# Patient Record
Sex: Female | Born: 1945 | Race: White | Hispanic: No | Marital: Single | Smoking: Never smoker
Health system: Southern US, Community
[De-identification: ages and names within clinical notes are randomized; demographics above are authoritative.]

## PROBLEM LIST (undated history)

## (undated) DIAGNOSIS — Z860100 Personal history of colon polyps, unspecified: Secondary | ICD-10-CM

## (undated) DIAGNOSIS — K219 Gastro-esophageal reflux disease without esophagitis: Secondary | ICD-10-CM

## (undated) DIAGNOSIS — I10 Essential (primary) hypertension: Secondary | ICD-10-CM

## (undated) DIAGNOSIS — T7840XA Allergy, unspecified, initial encounter: Secondary | ICD-10-CM

## (undated) DIAGNOSIS — E079 Disorder of thyroid, unspecified: Secondary | ICD-10-CM

## (undated) HISTORY — DX: Allergy, unspecified, initial encounter: T78.40XA

## (undated) HISTORY — DX: Personal history of colonic polyps: Z86.010

## (undated) HISTORY — DX: Essential (primary) hypertension: I10

## (undated) HISTORY — PX: TONSILLECTOMY: SUR1361

## (undated) HISTORY — DX: Disorder of thyroid, unspecified: E07.9

## (undated) HISTORY — PX: WRIST SURGERY: SHX841

## (undated) HISTORY — PX: MOUTH SURGERY: SHX715

## (undated) HISTORY — DX: Gastro-esophageal reflux disease without esophagitis: K21.9

## (undated) HISTORY — DX: Personal history of colon polyps, unspecified: Z86.0100

---

## 1982-07-20 LAB — HM PAP SMEAR

## 1982-07-20 LAB — HM MAMMOGRAPHY

## 2011-07-21 LAB — HM COLONOSCOPY

## 2011-11-19 ENCOUNTER — Encounter: Payer: Self-pay | Admitting: Gastroenterology

## 2011-11-23 ENCOUNTER — Ambulatory Visit (INDEPENDENT_AMBULATORY_CARE_PROVIDER_SITE_OTHER): Payer: BC Managed Care – PPO | Admitting: Gastroenterology

## 2011-11-23 ENCOUNTER — Encounter: Payer: Self-pay | Admitting: Gastroenterology

## 2011-11-23 VITALS — BP 140/82 | HR 92 | Ht 62.0 in | Wt 141.0 lb

## 2011-11-23 DIAGNOSIS — K625 Hemorrhage of anus and rectum: Secondary | ICD-10-CM

## 2011-11-23 DIAGNOSIS — Z800 Family history of malignant neoplasm of digestive organs: Secondary | ICD-10-CM | POA: Insufficient documentation

## 2011-11-23 MED ORDER — PEG-KCL-NACL-NASULF-NA ASC-C 100 G PO SOLR: 1.0000 | Freq: Once | ORAL | Status: DC

## 2011-11-23 MED ORDER — HYDROCORTISONE ACETATE 25 MG RE SUPP: 25.0000 mg | Freq: Two times a day (BID) | RECTAL | Status: AC

## 2011-11-23 NOTE — Assessment & Plan Note (Addendum)
Limited rectal bleeding could be due to hemorrhoids. A more proximal colonic bleeding source should be ruled out.  Recommendations #1 colonoscopy #2 trial of Anusol HC suppositories

## 2011-11-23 NOTE — Patient Instructions (Signed)
Colonoscopy A colonoscopy is an exam to evaluate your entire colon. In this exam, your colon is cleansed. A long fiberoptic tube is inserted through your rectum and into your colon. The fiberoptic scope (endoscope) is a long bundle of enclosed and very flexible fibers. These fibers transmit light to the area examined and send images from that area to your caregiver. Discomfort is usually minimal. You may be given a drug to help you sleep (sedative) during or prior to the procedure. This exam helps to detect lumps (tumors), polyps, inflammation, and areas of bleeding. Your caregiver may also take a small piece of tissue (biopsy) that will be examined under a microscope. LET YOUR CAREGIVER KNOW ABOUT:   Allergies to food or medicine.   Medicines taken, including vitamins, herbs, eyedrops, over-the-counter medicines, and creams.   Use of steroids (by mouth or creams).   Previous problems with anesthetics or numbing medicines.   History of bleeding problems or blood clots.   Previous surgery.   Other health problems, including diabetes and kidney problems.   Possibility of pregnancy, if this applies.  BEFORE THE PROCEDURE   A clear liquid diet may be required for 2 days before the exam.   Ask your caregiver about changing or stopping your regular medications.   Liquid injections (enemas) or laxatives may be required.   A large amount of electrolyte solution may be given to you to drink over a short period of time. This solution is used to clean out your colon.   You should be present 60 minutes prior to your procedure or as directed by your caregiver.  AFTER THE PROCEDURE   If you received a sedative or pain relieving medication, you will need to arrange for someone to drive you home.   Occasionally, there is a little blood passed with the first bowel movement. Do not be concerned.  FINDING OUT THE RESULTS OF YOUR TEST Not all test results are available during your visit. If your test  results are not back during the visit, make an appointment with your caregiver to find out the results. Do not assume everything is normal if you have not heard from your caregiver or the medical facility. It is important for you to follow up on all of your test results. HOME CARE INSTRUCTIONS   It is not unusual to pass moderate amounts of gas and experience mild abdominal cramping following the procedure. This is due to air being used to inflate your colon during the exam. Walking or a warm pack on your belly (abdomen) may help.   You may resume all normal meals and activities after sedatives and medicines have worn off.   Only take over-the-counter or prescription medicines for pain, discomfort, or fever as directed by your caregiver. Do not use aspirin or blood thinners if a biopsy was taken. Consult your caregiver for medicine usage if biopsies were taken.  SEEK IMMEDIATE MEDICAL CARE IF:   You have a fever.   You pass large blood clots or fill a toilet with blood following the procedure. This may also occur 10 to 14 days following the procedure. This is more likely if a biopsy was taken.   You develop abdominal pain that keeps getting worse and cannot be relieved with medicine.  Document Released: 07/03/2000 Document Revised: 06/25/2011 Document Reviewed: 02/16/2008 Exit[employer] Patient Information 2012 [employer]. 

## 2011-11-23 NOTE — Assessment & Plan Note (Signed)
2 half siblings with colon cancer. Plan colonoscopy

## 2011-11-23 NOTE — Progress Notes (Signed)
History of Present Illness: Ann Perkins is a pleasant 66 year old white female self-referred for evaluation of rectal bleeding. In the past she's had very intermittent bleeding consisting of small amounts of blood coating the stools. This may occur more frequently when she eats roughage. In the last couple of months bleeding has increased. She has occasional rectal discomfort. There has been no change of bowel habits. She has a tendency to have loose stools. Family history is pertinent for 2 half siblings with colon cancer.    Past Medical History  Diagnosis Date  . Hemorrhoids    Past Surgical History  Procedure Date  . Tonsillectomy    family history includes Colon cancer in her brother, cousin, and sister. No current outpatient prescriptions on file.   Allergies as of 11/23/2011 - Review Complete 11/23/2011  Allergen Reaction Noted  . Erythromycin  11/23/2011    reports that she has never smoked. She has never used smokeless tobacco. She reports that she does not drink alcohol or use illicit drugs.     Review of Systems: Pertinent positive and negative review of systems were noted in the above HPI section. All other review of systems were otherwise negative.  Vital signs were reviewed in today's medical record Physical Exam: General: Well developed , well nourished, no acute distress Head: Normocephalic and atraumatic Eyes:  sclerae anicteric, EOMI Ears: Normal auditory acuity Mouth: No deformity or lesions Neck: Supple, no masses or thyromegaly Lungs: Clear throughout to auscultation Heart: Regular rate and rhythm; no murmurs, rubs or bruits Abdomen: Soft, non tender and non distended. No masses, hepatosplenomegaly or hernias noted. Normal Bowel sounds Rectal: There are no external rectal lesions Musculoskeletal: Symmetrical with no gross deformities  Skin: No lesions on visible extremities Pulses:  Normal pulses noted Extremities: No clubbing, cyanosis, edema or  deformities noted Neurological: Alert oriented x 4, grossly nonfocal Cervical Nodes:  No significant cervical adenopathy Inguinal Nodes: No significant inguinal adenopathy Psychological:  Alert and cooperative. Normal mood and affect

## 2011-11-24 ENCOUNTER — Telehealth: Payer: Self-pay | Admitting: Gastroenterology

## 2011-11-24 NOTE — Telephone Encounter (Signed)
Pt states that when she had some dental work done years ago she had some problems with her heart skipping some. Pt wanted to make sure she told Dr. Arlyce Dice since she is scheduled for the colon with propofol. Dr. Arlyce Dice notified.

## 2011-11-24 NOTE — Telephone Encounter (Signed)
ok 

## 2011-12-01 ENCOUNTER — Ambulatory Visit (AMBULATORY_SURGERY_CENTER): Payer: BC Managed Care – PPO | Admitting: Gastroenterology

## 2011-12-01 ENCOUNTER — Encounter: Payer: Self-pay | Admitting: Gastroenterology

## 2011-12-01 VITALS — BP 150/96 | HR 102 | Temp 98.3°F | Resp 20 | Ht 62.0 in | Wt 141.0 lb

## 2011-12-01 DIAGNOSIS — D126 Benign neoplasm of colon, unspecified: Secondary | ICD-10-CM

## 2011-12-01 DIAGNOSIS — Z800 Family history of malignant neoplasm of digestive organs: Secondary | ICD-10-CM

## 2011-12-01 DIAGNOSIS — K5730 Diverticulosis of large intestine without perforation or abscess without bleeding: Secondary | ICD-10-CM

## 2011-12-01 DIAGNOSIS — K625 Hemorrhage of anus and rectum: Secondary | ICD-10-CM

## 2011-12-01 MED ORDER — SODIUM CHLORIDE 0.9 % IV SOLN: 500.0000 mL | INTRAVENOUS | Status: DC

## 2011-12-01 NOTE — Progress Notes (Signed)
The pt tolerated the colonoscopy very well. Maw   

## 2011-12-01 NOTE — Progress Notes (Signed)
Patient did not have preoperative order for IV antibiotic SSI prophylaxis. (G8918)  Patient did not experience any of the following events: a burn prior to discharge; a fall within the facility; wrong site/side/patient/procedure/implant event; or a hospital transfer or hospital admission upon discharge from the facility. (G8907)  

## 2011-12-01 NOTE — Patient Instructions (Signed)
Impressions/Recommendations:  Polyps removed (handout given) Diverticulosis (handout given)  Call the office to schedule an appointment for 4-6 weeks.  YOU HAD AN ENDOSCOPIC PROCEDURE TODAY AT THE [facility]: Refer to the procedure report that was given to you for any specific questions about what was found during the examination.  If the procedure report does not answer your questions, please call your gastroenterologist to clarify.  If you requested that your care partner not be given the details of your procedure findings, then the procedure report has been included in a sealed envelope for you to review at your convenience later.  YOU SHOULD EXPECT: Some feelings of bloating in the abdomen. Passage of more gas than usual.  Walking can help get rid of the air that was put into your GI tract during the procedure and reduce the bloating. If you had a lower endoscopy (such as a colonoscopy or flexible sigmoidoscopy) you may notice spotting of blood in your stool or on the toilet paper. If you underwent a bowel prep for your procedure, then you may not have a normal bowel movement for a few days.  DIET: Your first meal following the procedure should be a light meal and then it is ok to progress to your normal diet.  A half-sandwich or bowl of soup is an example of a good first meal.  Heavy or fried foods are harder to digest and may make you feel nauseous or bloated.  Likewise meals heavy in dairy and vegetables can cause extra gas to form and this can also increase the bloating.  Drink plenty of fluids but you should avoid alcoholic beverages for 24 hours.  ACTIVITY: Your care partner should take you home directly after the procedure.  You should plan to take it easy, moving slowly for the rest of the day.  You can resume normal activity the day after the procedure however you should NOT DRIVE or use heavy machinery for 24 hours (because of the sedation medicines used during the test).     SYMPTOMS TO REPORT IMMEDIATELY: A gastroenterologist can be reached at any hour.  During normal business hours, 8:30 AM to 5:00 PM Monday through Friday, call [phone].  After hours and on weekends, please call the GI answering service at [phone] who will take a message and have the physician on call contact you.   Following lower endoscopy (colonoscopy or flexible sigmoidoscopy):  Excessive amounts of blood in the stool  Significant tenderness or worsening of abdominal pains  Swelling of the abdomen that is new, acute  Fever of 100F or higher  Following upper endoscopy (EGD)  Vomiting of blood or coffee ground material  New chest pain or pain under the shoulder blades  Painful or persistently difficult swallowing  New shortness of breath  Fever of 100F or higher  Black, tarry-looking stools  FOLLOW UP: If any biopsies were taken you will be contacted by phone or by letter within the next 1-3 weeks.  Call your gastroenterologist if you have not heard about the biopsies in 3 weeks.  Our staff will call the home number listed on your records the next business day following your procedure to check on you and address any questions or concerns that you may have at that time regarding the information given to you following your procedure. This is a courtesy call and so if there is no answer at the home number and we have not heard from you through the emergency physician on  physician on call, we will assume that you have returned to your regular daily activities without incident.  SIGNATURES/CONFIDENTIALITY: You and/or your care partner have signed paperwork which will be entered into your electronic medical record.  These signatures attest to the fact that that the information above on your After Visit Summary has been reviewed and is understood.  Full responsibility of the confidentiality of this discharge information lies with you and/or your care-partner. 

## 2011-12-01 NOTE — Op Note (Signed)
[facility] [address] [place], [place]  [address]  COLONOSCOPY PROCEDURE REPORT  PATIENT:  Ann Perkins, Ann Perkins  MR#:  [id number] BIRTHDATE:  09/01/1945, 66 yrs. old  GENDER:  female ENDOSCOPIST:  Barbette Hair. Arlyce Dice, MD REF. BY: PROCEDURE DATE:  12/01/2011 PROCEDURE:  Colonoscopy with snare polypectomy ASA CLASS:  Class II INDICATIONS:  Elevated Risk Screening, family history of colon cancer Half sister and brother with colon ca MEDICATIONS:   MAC sedation, administered by CRNA propofol 300mg IV  DESCRIPTION OF PROCEDURE:   After the risks benefits and alternatives of the procedure were thoroughly explained, informed consent was obtained.  Digital rectal exam was performed and revealed no abnormalities.   The LB [id number] endoscope was introduced through the anus and advanced to the cecum, which was identified by both the appendix and ileocecal valve, without limitations.  The quality of the prep was excellent, using MoviPrep.  The instrument was then slowly withdrawn as the colon was fully examined. <<PROCEDUREIMAGES>>  FINDINGS:  A sessile polyp was found in the cecum. It was 8 mm in size. Polyp was snared without cautery. Retrieval was successful (see image6). snare polyp  A pedunculated polyp was found in the sigmoid colon. It was 30 cm in size. Polyps were snared, then cauterized with monopolar cautery. Retrieval was successful (see image7 and image9). snare polyp  A sessile polyp was found in the sigmoid colon. It was 10 mm in size. It was found 20 cm from the point of entry. Polyp was snared without cautery. Retrieval was unsuccessful (see image10). snare polyp  A sessile polyp was found in the recto-sigmoid colon. It was 3 mm in size. It was found 10 cm from the point of entry. Polyp was snared without cautery. Retrieval was successful (see image11). snare polyp  other finding in the rectum (see image12). Enlarged anal papillae  Scattered diverticula were  found in the sigmoid colon (see image13).  This was otherwise a normal examination of the colon (see image2 and image3).   Retroflexed views in the rectum revealed no abnormalities.    The time to cecum =  1) 3.25  minutes. The scope was then withdrawn in  1) 22.50  minutes from the cecum and the procedure completed. COMPLICATIONS:  None ENDOSCOPIC IMPRESSION: 1) 8 mm sessile polyp in the cecum 2) 30 cm pedunculated polyp in the sigmoid colon 3) 10 mm sessile polyp in the sigmoid colon 4) 3 mm sessile polyp in the recto-sigmoid colon 5) Other finding in the rectum 6) Diverticula, scattered in the sigmoid colon 7) Otherwise normal examination RECOMMENDATIONS: 1) If the polyp(s) removed today are proven to be adenomatous (pre-cancerous) polyps, you will need a colonoscopy in 3 years. Otherwise you should continue to follow colorectal cancer screening guidelines for "routine risk" patients with a colonoscopy in 10 years. You will receive a letter within 1-2 weeks with the results of your biopsy as well as final recommendations. Please call my office if you have not received a letter after 3 weeks. REPEAT EXAM:   You will receive a letter from Dr. Arlyce Dice in 1-2 weeks, after reviewing the final pathology, with followup recommendations.  ______________________________ Barbette Hair Arlyce Dice, MD  CC:  n. eSIGNED:   Barbette Hair. Danylle Ouk at 12/01/2011 03:14 PM  [place], [place], [id number]

## 2011-12-02 ENCOUNTER — Telehealth: Payer: Self-pay

## 2011-12-02 NOTE — Telephone Encounter (Signed)
  Follow up Call-  Call back number 12/01/2011  Post procedure Call Back phone  # [phone]  Permission to leave phone message Yes     Patient questions:  Do you have a fever, pain , or abdominal swelling? no Pain Score  0 *  Have you tolerated food without any problems? yes  Have you been able to return to your normal activities? yes  Do you have any questions about your discharge instructions: Diet   no Medications  no Follow up visit  no  Do you have questions or concerns about your Care? no  Actions: * If pain score is 4 or above: No action needed, pain <4.

## 2011-12-10 ENCOUNTER — Encounter: Payer: Self-pay | Admitting: Gastroenterology

## 2012-01-27 ENCOUNTER — Ambulatory Visit (INDEPENDENT_AMBULATORY_CARE_PROVIDER_SITE_OTHER): Payer: BC Managed Care – PPO | Admitting: Gastroenterology

## 2012-01-27 ENCOUNTER — Encounter: Payer: Self-pay | Admitting: Gastroenterology

## 2012-01-27 VITALS — BP 166/94 | HR 100 | Ht 62.0 in | Wt 139.0 lb

## 2012-01-27 DIAGNOSIS — K625 Hemorrhage of anus and rectum: Secondary | ICD-10-CM

## 2012-01-27 DIAGNOSIS — Z800 Family history of malignant neoplasm of digestive organs: Secondary | ICD-10-CM

## 2012-01-27 DIAGNOSIS — Z86010 Personal history of colonic polyps: Secondary | ICD-10-CM | POA: Insufficient documentation

## 2012-01-27 NOTE — Progress Notes (Signed)
History of Present Illness:  Ann Perkins has returned following colonoscopy. Multiple colon polyps were removed. 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 Her main complaint is frequent low-volume stools. This may occur after eating vegetables. Stools tend to be solid.    Review of Systems: Pertinent positive and negative review of systems were noted in the above HPI section. All other review of systems were otherwise negative.    Current Medications, Allergies, Past Medical History, Past Surgical History, Family History and Social History were reviewed in [employer] electronic medical record  Vital signs were reviewed in today's medical record. Physical Exam: General: Well developed , well nourished, no acute distress

## 2012-01-27 NOTE — Assessment & Plan Note (Signed)
Plan colonoscopy 2016 

## 2012-01-27 NOTE — Assessment & Plan Note (Signed)
Probably related to hemorrhoids or polyposis. No further therapy or workup at this point.

## 2012-01-27 NOTE — Patient Instructions (Addendum)
Follow up as needed

## 2012-01-27 NOTE — Assessment & Plan Note (Signed)
Plan screening colonoscopy every 5 years 

## 2012-10-19 ENCOUNTER — Encounter: Payer: Self-pay | Admitting: Internal Medicine

## 2012-10-19 ENCOUNTER — Ambulatory Visit (INDEPENDENT_AMBULATORY_CARE_PROVIDER_SITE_OTHER): Payer: Medicare Other | Admitting: Internal Medicine

## 2012-10-19 ENCOUNTER — Telehealth: Payer: Self-pay | Admitting: Internal Medicine

## 2012-10-19 VITALS — BP 180/90 | HR 105 | Temp 97.8°F | Ht 59.0 in | Wt 140.2 lb

## 2012-10-19 DIAGNOSIS — Z13220 Encounter for screening for lipoid disorders: Secondary | ICD-10-CM

## 2012-10-19 DIAGNOSIS — I10 Essential (primary) hypertension: Secondary | ICD-10-CM

## 2012-10-19 DIAGNOSIS — Z800 Family history of malignant neoplasm of digestive organs: Secondary | ICD-10-CM

## 2012-10-19 DIAGNOSIS — K219 Gastro-esophageal reflux disease without esophagitis: Secondary | ICD-10-CM

## 2012-10-19 DIAGNOSIS — Z86010 Personal history of colonic polyps: Secondary | ICD-10-CM

## 2012-10-19 DIAGNOSIS — K625 Hemorrhage of anus and rectum: Secondary | ICD-10-CM

## 2012-10-19 LAB — COMPREHENSIVE METABOLIC PANEL
ALT: 20 U/L (ref 0–35)
Albumin: 4.2 g/dL (ref 3.5–5.2)
CO2: 27 mEq/L (ref 19–32)
Chloride: 103 mEq/L (ref 96–112)
GFR: 64.27 mL/min (ref >60.00)
Glucose, Bld: 108 mg/dL — ABNORMAL HIGH (ref 70–99)
Potassium: 4 mEq/L (ref 3.5–5.1)
Sodium: 139 mEq/L (ref 135–145)
Total Bilirubin: 1.2 mg/dL (ref 0.3–1.2)
Total Protein: 7.9 g/dL (ref 6.0–8.3)

## 2012-10-19 LAB — CBC WITH DIFFERENTIAL/PLATELET
Basophils Absolute: 0 10*3/uL (ref 0.0–0.1)
Eosinophils Relative: 0.6 % (ref 0.0–5.0)
HCT: 42.4 % (ref 36.0–46.0)
Lymphocytes Relative: 21.6 % (ref 12.0–46.0)
Monocytes Relative: 8.5 % (ref 3.0–12.0)
Neutrophils Relative %: 68.9 % (ref 43.0–77.0)
Platelets: 197 10*3/uL (ref 150.0–400.0)
RDW: 13.3 % (ref 11.5–14.6)
WBC: 6.4 10*3/uL (ref 4.5–10.5)

## 2012-10-19 LAB — TSH: TSH: 10.25 u[IU]/mL — ABNORMAL HIGH (ref 0.35–5.50)

## 2012-10-19 LAB — LIPID PANEL
HDL: 37.7 mg/dL — ABNORMAL LOW (ref >39.00)
Total CHOL/HDL Ratio: 5
VLDL: 22.8 mg/dL (ref 0.0–40.0)

## 2012-10-19 MED ORDER — LISINOPRIL 10 MG PO TABS: 10.0000 mg | ORAL_TABLET | Freq: Every day | ORAL | Status: DC

## 2012-10-19 NOTE — Assessment & Plan Note (Signed)
On no medication.  Blood pressure elevated and has been elevated on multiple checks.  Will start Lisinopril 10mg  q day.  Have her spot check her pressure and get her back in soon to reassess.  Check metabolic panel.

## 2012-10-19 NOTE — Assessment & Plan Note (Signed)
Controlled if she watches what she eats.  Follow.     

## 2012-10-19 NOTE — Assessment & Plan Note (Signed)
Resolved.  No bleeding now.  Follow.

## 2012-10-19 NOTE — Telephone Encounter (Signed)
Left detailed message on patient's voice mail.

## 2012-10-19 NOTE — Assessment & Plan Note (Signed)
Up to date with colonoscopy as outlined.  Follow.   

## 2012-10-19 NOTE — Telephone Encounter (Signed)
Would go ahead and start today and then can continue q am.

## 2012-10-19 NOTE — Telephone Encounter (Signed)
Pt would like to know if she needs to take her bp meds in am or pm Please advise

## 2012-10-19 NOTE — Progress Notes (Signed)
Subjective:    Patient ID: Ann Perkins, female    DOB: Nov 10, 1945, 67 y.o.   MRN: [id number]  HPI 67 year old female with past history of colon polyps, GERD and elevated blood pressure.  She comes in today to follow up on these issues as well as to establish care. She states she has not had a primary care doctor in years.  Did see Dr Arlyce Dice last year.  Had a colonoscopy.  Polyps found.  Adenomatous polyps.  Recommended follow up colonoscopy in three years.  She states she still will have some rectal irritation at times with straining.  No more blood.  Bowels stable.  Eating fiber bars.  She did have mouth surgery.  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  Second surgery - clear margins.  Has been released to follow up with her dentist.  Occasionally has acid reflux.  Is she avoids foods that aggravate - no problems.  No dysphagia.  No nausea or vomiting.     Past Medical History  Diagnosis Date  . Hemorrhoids   . Allergy   . GERD (gastroesophageal reflux disease)   . History of colon polyps   . Hypertension     Outpatient Encounter Prescriptions as of 10/19/2012  Medication Sig Dispense Refill  . hydrocortisone (ANUSOL-HC) 25 MG suppository Place 25 mg rectally 2 (two) times daily.      Marland Kitchen lisinopril (PRINIVIL,ZESTRIL) 10 MG tablet Take 1 tablet (10 mg total) by mouth daily.  30 tablet  1  . sodium chloride (OCEAN) 0.65 % nasal spray Place 1 spray into the nose as needed for congestion.      . [DISCONTINUED] lisinopril (PRINIVIL,ZESTRIL) 10 MG tablet Take 10 mg by mouth daily.       No facility-administered encounter medications on file as of 10/19/2012.    Review of Systems Patient denies any headache, lightheadedness or dizziness.  No sinus or allergy symptoms.  No chest pain, tightness or palpitations.  No increased shortness of breath, cough or congestion.  No nausea or vomiting.  No significant acid reflux.  If she avoids foods that aggravate, no problems.  No abdominal pain or cramping.  No BRBPR  or melana.  No significant constipation.  Will notice some irritation if she strains.  No urine change.         Objective:   Physical Exam Filed Vitals:   10/19/12 1033  BP: 180/90  Pulse: 105  Temp: 97.8 F (36.6 C)   Blood pressure recheck:  164/96, pulse 37  67 year old female in no acute distress.   HEENT:  Nares- clear.  Oropharynx - without lesions. NECK:  Supple.  Nontender.  No audible bruit.  HEART:  Appears to be regular. LUNGS:  No crackles or wheezing audible.  Respirations even and unlabored.  RADIAL PULSE:  Equal bilaterally.  ABDOMEN:  Soft, nontender.  Bowel sounds present and normal.  No audible abdominal bruit.   RECTAL:  Heme negative.  No external hemorrhoids.  Minimal irritation with rectal exam.   EXTREMITIES:  No increased edema present.     SKIN:  No rash.  VASCULAR:  DP pulses palpable and equal bilaterally.      Assessment & Plan:  RECTAL IRRITATION.  Exam as outlined.  Annusol HC suppositories as directed.  Follow.  Notify me if persistent problems.   ENT.  S/P mouth surgery for mild dysplasia.  Second surgery - margins clear.  Released to follow up with her dentist.    HEALTH MAINTENANCE.  Overdue a physical.  Will schedule for next visit.  Had colonoscopy as outlined.  Declines mammogram.  Declines pneumovax.

## 2012-10-19 NOTE — Assessment & Plan Note (Signed)
Colonoscopy 12/01/11 revealed multiple polyps.  Recommended follow up colonoscopy 3 years.     

## 2012-10-23 ENCOUNTER — Telehealth: Payer: Self-pay | Admitting: Internal Medicine

## 2012-10-23 MED ORDER — LEVOTHYROXINE SODIUM 50 MCG PO TABS: 50.0000 ug | ORAL_TABLET | Freq: Every day | ORAL | Status: DC

## 2012-10-23 NOTE — Telephone Encounter (Signed)
Pt notified of lab results and need to start synthroid q day.  rx sent in to pharmacy.

## 2012-11-11 ENCOUNTER — Encounter: Payer: Self-pay | Admitting: Internal Medicine

## 2012-11-11 ENCOUNTER — Ambulatory Visit (INDEPENDENT_AMBULATORY_CARE_PROVIDER_SITE_OTHER): Payer: Medicare Other | Admitting: Internal Medicine

## 2012-11-11 VITALS — BP 140/94 | HR 89 | Temp 98.1°F | Ht 59.0 in | Wt 141.5 lb

## 2012-11-11 DIAGNOSIS — K219 Gastro-esophageal reflux disease without esophagitis: Secondary | ICD-10-CM

## 2012-11-11 DIAGNOSIS — E039 Hypothyroidism, unspecified: Secondary | ICD-10-CM

## 2012-11-11 DIAGNOSIS — I10 Essential (primary) hypertension: Secondary | ICD-10-CM

## 2012-11-11 LAB — BASIC METABOLIC PANEL
BUN: 14 mg/dL (ref 6–23)
Chloride: 106 mEq/L (ref 96–112)
GFR: 65.07 mL/min (ref >60.00)
Potassium: 4.9 mEq/L (ref 3.5–5.1)
Sodium: 137 mEq/L (ref 135–145)

## 2012-11-11 LAB — TSH: TSH: 2.87 u[IU]/mL (ref 0.35–5.50)

## 2012-11-13 ENCOUNTER — Encounter: Payer: Self-pay | Admitting: Internal Medicine

## 2012-11-13 DIAGNOSIS — E039 Hypothyroidism, unspecified: Secondary | ICD-10-CM | POA: Insufficient documentation

## 2012-11-13 NOTE — Progress Notes (Signed)
  Subjective:    Patient ID: Ann Perkins, female    DOB: 03-29-1946, 67 y.o.   MRN: [id number]  HPI 68 year old female with past history of colon polyps, GERD and elevated blood pressure.  She comes in today for a scheduled follow up.  Here to follow up on her blood pressure.  Was started on Lisinopril 10mg  q day last visit.  Tolerating.  Blood pressures averaging 134-140/82-90.  Overall feels good.  Was started on synthroid after labs returned with an elevated tsh.  Tolerating.  She had a few patches of a red rash.  No diffuse rash.  This has resolved now.  She was questioning if this was related to the new medications.  No tongue swelling or lip swelling.  No breathing difficulty.   Overalls she feels she is doing well.    Past Medical History  Diagnosis Date  . Hemorrhoids   . Allergy   . GERD (gastroesophageal reflux disease)   . History of colon polyps   . Hypertension     Outpatient Encounter Prescriptions as of 11/11/2012  Medication Sig Dispense Refill  . levothyroxine (SYNTHROID) 50 MCG tablet Take 1 tablet (50 mcg total) by mouth daily before breakfast. Name brand only  30 tablet  2  . lisinopril (PRINIVIL,ZESTRIL) 10 MG tablet Take 1 tablet (10 mg total) by mouth daily.  30 tablet  1  . sodium chloride (OCEAN) 0.65 % nasal spray Place 1 spray into the nose as needed for congestion.      . hydrocortisone (ANUSOL-HC) 25 MG suppository Place 25 mg rectally 2 (two) times daily.       No facility-administered encounter medications on file as of 11/11/2012.    Review of Systems Patient denies any headache, lightheadedness or dizziness.  No sinus or allergy symptoms.  No chest pain, tightness or palpitations.  No increased shortness of breath, cough or congestion.  No nausea or vomiting.  No significant acid reflux.  No abdominal pain or cramping.  No BRBPR or melana.  No significant constipation.  No urine change.  Tolerating the lisinopril.  Blood pressures as outlined.        Objective:   Physical Exam  Filed Vitals:   11/11/12 1037  BP: 140/94  Pulse: 89  Temp: 98.1 F (36.7 C)   Blood pressure recheck:  148/90, pulse 79  67 year old female in no acute distress.   HEENT:  Nares- clear.  Oropharynx - without lesions. NECK:  Supple.  Nontender.  No audible bruit.  HEART:  Appears to be regular. LUNGS:  No crackles or wheezing audible.  Respirations even and unlabored.  RADIAL PULSE:  Equal bilaterally.  ABDOMEN:  Soft, nontender.  Bowel sounds present and normal.  No audible abdominal bruit.   EXTREMITIES:  No increased edema present.     SKIN:  No rash.  VASCULAR:  DP pulses palpable and equal bilaterally.      Assessment & Plan:  RASH.  Not present now.  I do not feel this is related to her medications.  Follow.    ENT.  S/P mouth surgery for mild dysplasia.  Second surgery - margins clear.  Released to follow up with her dentist.    HEALTH MAINTENANCE.  Overdue a physical.  Will need to schedule.  Had colonoscopy as outlined.  Declines mammogram.  Declines pneumovax.

## 2012-11-13 NOTE — Assessment & Plan Note (Signed)
Was started on Lisinopril 10mg  q day last visit.  Tolerating.  Blood pressure as outlined.  Improved.  Discussed increasing the dose to 20mg  q day.  She wanted to give it a little longer on the 10mg  dose.  Follow pressures.   Get her back in soon to reassess.  Check metabolic panel.

## 2012-11-13 NOTE — Assessment & Plan Note (Signed)
Started on synthroid.  Check tsh.

## 2012-11-13 NOTE — Assessment & Plan Note (Signed)
Controlled if she watches what she eats.  Follow.     

## 2012-11-14 ENCOUNTER — Encounter: Payer: Self-pay | Admitting: *Deleted

## 2012-12-15 ENCOUNTER — Encounter: Payer: Self-pay | Admitting: Internal Medicine

## 2012-12-15 ENCOUNTER — Ambulatory Visit (INDEPENDENT_AMBULATORY_CARE_PROVIDER_SITE_OTHER): Payer: Medicare Other | Admitting: Internal Medicine

## 2012-12-15 VITALS — BP 118/70 | HR 89 | Temp 98.4°F | Ht 59.0 in | Wt 139.2 lb

## 2012-12-15 DIAGNOSIS — I10 Essential (primary) hypertension: Secondary | ICD-10-CM

## 2012-12-15 DIAGNOSIS — E039 Hypothyroidism, unspecified: Secondary | ICD-10-CM

## 2012-12-15 DIAGNOSIS — Z86010 Personal history of colonic polyps: Secondary | ICD-10-CM

## 2012-12-15 DIAGNOSIS — K219 Gastro-esophageal reflux disease without esophagitis: Secondary | ICD-10-CM

## 2012-12-15 MED ORDER — LISINOPRIL 5 MG PO TABS: ORAL_TABLET | ORAL | Status: DC

## 2012-12-15 NOTE — Progress Notes (Signed)
  Subjective:    Patient ID: Ann Perkins, female    DOB: 1945-09-14, 67 y.o.   MRN: [id number]  HPI 67 year old female with past history of colon polyps, GERD and elevated blood pressure.  She comes in today for a scheduled follow up.  Here to follow up on her blood pressure.  Was started on Lisinopril 10mg  q day last visit.  Tolerating.  Blood pressures averaging 140s/81-83.  Overall feels good.  Was started on synthroid after labs returned with an elevated tsh.  Tolerating.  Stays active.  No cardiac symptoms with increased activity or exertion.     Past Medical History  Diagnosis Date  . Hemorrhoids   . Allergy   . GERD (gastroesophageal reflux disease)   . History of colon polyps   . Hypertension     Outpatient Encounter Prescriptions as of 12/15/2012  Medication Sig Dispense Refill  . levothyroxine (SYNTHROID) 50 MCG tablet Take 1 tablet (50 mcg total) by mouth daily before breakfast. Name brand only  30 tablet  2  . sodium chloride (OCEAN) 0.65 % nasal spray Place 1 spray into the nose as needed for congestion.      . [DISCONTINUED] lisinopril (PRINIVIL,ZESTRIL) 10 MG tablet Take 1 tablet (10 mg total) by mouth daily.  30 tablet  1  . hydrocortisone (ANUSOL-HC) 25 MG suppository Place 25 mg rectally 2 (two) times daily.      Marland Kitchen lisinopril (PRINIVIL,ZESTRIL) 5 MG tablet Take three tablets q day  90 tablet  2   No facility-administered encounter medications on file as of 12/15/2012.    Review of Systems Patient denies any headache, lightheadedness or dizziness.  No sinus or allergy symptoms.  No chest pain, tightness or palpitations.  No increased shortness of breath, cough or congestion.  No nausea or vomiting.  No significant acid reflux.  No abdominal pain or cramping.  No BRBPR or melana.  No significant constipation.  No urine change.  Tolerating the lisinopril.  Blood pressures as outlined.       Objective:   Physical Exam  Filed Vitals:   12/15/12 1036  BP: 118/70  Pulse:  89  Temp: 98.4 F (36.9 C)   Blood pressure recheck:  136/76, pulse 34  67 year old female in no acute distress.   HEENT:  Nares- clear.  Oropharynx - without lesions. NECK:  Supple.  Nontender.  No audible bruit.  HEART:  Appears to be regular. LUNGS:  No crackles or wheezing audible.  Respirations even and unlabored.  RADIAL PULSE:  Equal bilaterally.  ABDOMEN:  Soft, nontender.  Bowel sounds present and normal.  No audible abdominal bruit.   EXTREMITIES:  No increased edema present. VASCULAR:  DP pulses palpable and equal bilaterally.      Assessment & Plan:  ENT.  S/P mouth surgery for mild dysplasia.  Second surgery - margins clear.  Released to follow up with her dentist.    HEALTH MAINTENANCE.  Overdue a physical.  Scheduled.   Had colonoscopy.  Declines mammogram.  Declines pneumovax.

## 2012-12-18 ENCOUNTER — Encounter: Payer: Self-pay | Admitting: Internal Medicine

## 2012-12-18 NOTE — Assessment & Plan Note (Signed)
Controlled if she watches what she eats.  Follow.     

## 2012-12-18 NOTE — Assessment & Plan Note (Signed)
Started on synthroid.  Follow tsh.  Last tsh wnl.

## 2012-12-18 NOTE — Assessment & Plan Note (Signed)
Colonoscopy 12/01/11 revealed multiple polyps.  Recommended follow up colonoscopy 3 years.

## 2012-12-18 NOTE — Assessment & Plan Note (Signed)
Was started on Lisinopril 10mg  q day last visit.  Tolerating.  Blood pressure as outlined.  Improved.  Discussed increasing the dose to 20mg  q day.  She does not want to increase to 20mg .  Wants to instead increase lisinopril to 15mg  q day.  (will prescribe 5mg  tablets and have her take 3/day).  Follow.

## 2012-12-23 ENCOUNTER — Other Ambulatory Visit: Payer: Self-pay | Admitting: *Deleted

## 2012-12-23 MED ORDER — LEVOTHYROXINE SODIUM 50 MCG PO TABS: 50.0000 ug | ORAL_TABLET | Freq: Every day | ORAL | Status: DC

## 2013-02-15 ENCOUNTER — Other Ambulatory Visit: Payer: Self-pay | Admitting: *Deleted

## 2013-02-15 MED ORDER — LISINOPRIL 5 MG PO TABS: ORAL_TABLET | ORAL | Status: DC

## 2013-02-23 ENCOUNTER — Encounter: Payer: Self-pay | Admitting: Internal Medicine

## 2013-02-23 ENCOUNTER — Ambulatory Visit (INDEPENDENT_AMBULATORY_CARE_PROVIDER_SITE_OTHER): Payer: Medicare Other | Admitting: Internal Medicine

## 2013-02-23 VITALS — BP 130/70 | HR 88 | Temp 98.8°F | Ht 59.0 in | Wt 142.0 lb

## 2013-02-23 DIAGNOSIS — K219 Gastro-esophageal reflux disease without esophagitis: Secondary | ICD-10-CM

## 2013-02-23 DIAGNOSIS — E039 Hypothyroidism, unspecified: Secondary | ICD-10-CM

## 2013-02-23 DIAGNOSIS — I10 Essential (primary) hypertension: Secondary | ICD-10-CM

## 2013-02-23 DIAGNOSIS — Z86010 Personal history of colonic polyps: Secondary | ICD-10-CM

## 2013-02-23 DIAGNOSIS — Z800 Family history of malignant neoplasm of digestive organs: Secondary | ICD-10-CM

## 2013-02-23 LAB — BASIC METABOLIC PANEL
BUN: 19 mg/dL (ref 6–23)
Chloride: 107 mEq/L (ref 96–112)
GFR: 46.94 mL/min — ABNORMAL LOW (ref >60.00)
Potassium: 4.6 mEq/L (ref 3.5–5.1)

## 2013-02-23 LAB — TSH: TSH: 4.89 u[IU]/mL (ref 0.35–5.50)

## 2013-02-24 ENCOUNTER — Encounter: Payer: Self-pay | Admitting: Internal Medicine

## 2013-02-24 NOTE — Assessment & Plan Note (Signed)
Controlled if she watches what she eats.  Follow.     

## 2013-02-24 NOTE — Assessment & Plan Note (Signed)
Up to date with colonoscopy as outlined.  Follow.   

## 2013-02-24 NOTE — Progress Notes (Signed)
  Subjective:    Patient ID: Ann Perkins, female    DOB: 03-21-1946, 67 y.o.   MRN: [id number]  HPI 67 year old female with past history of colon polyps, GERD and elevated blood pressure.  She comes in today for a scheduled follow up.  Here to follow up on her blood pressure.  Lisinopril increased to 15mg  q day last visit.  Tolerating.  Blood pressures averaging 116-132/60-70s.  Overall feels good.  Was started on synthroid after labs returned with an elevated tsh.  Tolerating.  Stays active.  No cardiac symptoms with increased activity or exertion.     Past Medical History  Diagnosis Date  . Hemorrhoids   . Allergy   . GERD (gastroesophageal reflux disease)   . History of colon polyps   . Hypertension     Outpatient Encounter Prescriptions as of 02/23/2013  Medication Sig Dispense Refill  . levothyroxine (SYNTHROID) 50 MCG tablet Take 1 tablet (50 mcg total) by mouth daily before breakfast. Name brand only  30 tablet  10  . lisinopril (PRINIVIL,ZESTRIL) 5 MG tablet Take three tablets q day  90 tablet  5  . sodium chloride (OCEAN) 0.65 % nasal spray Place 1 spray into the nose as needed for congestion.      . [DISCONTINUED] hydrocortisone (ANUSOL-HC) 25 MG suppository Place 25 mg rectally 2 (two) times daily.       No facility-administered encounter medications on file as of 02/23/2013.    Review of Systems Patient denies any headache, lightheadedness or dizziness.  No sinus or allergy symptoms.  No chest pain, tightness or palpitations.  No increased shortness of breath, cough or congestion.  No nausea or vomiting.  No significant acid reflux.  No abdominal pain or cramping.  No BRBPR or melana.  No significant constipation.  No urine change.  Tolerating the lisinopril.  Blood pressures as outlined.       Objective:   Physical Exam  Filed Vitals:   02/23/13 1025  BP: 130/70  Pulse: 88  Temp: 98.8 F (37.1 C)   Blood pressure recheck:  61/49  67 year old female in no acute  distress.   HEENT:  Nares- clear.  Oropharynx - without lesions. NECK:  Supple.  Nontender.  No audible bruit.  HEART:  Appears to be regular. LUNGS:  No crackles or wheezing audible.  Respirations even and unlabored.  RADIAL PULSE:  Equal bilaterally.  ABDOMEN:  Soft, nontender.  Bowel sounds present and normal.  No audible abdominal bruit.   EXTREMITIES:  No increased edema present. VASCULAR:  DP pulses palpable and equal bilaterally.      Assessment & Plan:  ENT.  S/P mouth surgery for mild dysplasia.  Second surgery - margins clear.  Released to follow up with her dentist.    HEALTH MAINTENANCE.  Overdue a physical.  Scheduled.   Had colonoscopy.  (5/13).  Recommended follow up colonoscopy 2016.  Declines mammogram.  Declines pneumovax.

## 2013-02-24 NOTE — Assessment & Plan Note (Signed)
Started on synthroid.  Follow tsh.  Last tsh wnl.  Recheck tsh today to confirm stable.

## 2013-02-24 NOTE — Assessment & Plan Note (Signed)
On lisinopril 15mg  q day.  Tolerating.  Blood pressure as outlined.  Improved. Check metabolic panel.  Follow pressures.

## 2013-02-27 ENCOUNTER — Other Ambulatory Visit: Payer: Self-pay | Admitting: Internal Medicine

## 2013-02-27 DIAGNOSIS — N289 Disorder of kidney and ureter, unspecified: Secondary | ICD-10-CM

## 2013-02-27 NOTE — Progress Notes (Signed)
Order placed for f/u met b and urinalysis.   

## 2013-03-07 ENCOUNTER — Other Ambulatory Visit (INDEPENDENT_AMBULATORY_CARE_PROVIDER_SITE_OTHER): Payer: Medicare Other

## 2013-03-07 DIAGNOSIS — N289 Disorder of kidney and ureter, unspecified: Secondary | ICD-10-CM

## 2013-03-07 LAB — BASIC METABOLIC PANEL
BUN: 17 mg/dL (ref 6–23)
GFR: 50.76 mL/min — ABNORMAL LOW (ref >60.00)
Potassium: 4.7 mEq/L (ref 3.5–5.1)

## 2013-03-07 LAB — URINALYSIS, ROUTINE W REFLEX MICROSCOPIC
Bilirubin Urine: NEGATIVE
Hgb urine dipstick: NEGATIVE
Leukocytes, UA: NEGATIVE
Nitrite: NEGATIVE
pH: 6 (ref 5.0–8.0)

## 2013-03-09 ENCOUNTER — Encounter: Payer: Self-pay | Admitting: *Deleted

## 2013-03-09 ENCOUNTER — Other Ambulatory Visit: Payer: Self-pay | Admitting: Internal Medicine

## 2013-03-09 DIAGNOSIS — E7800 Pure hypercholesterolemia, unspecified: Secondary | ICD-10-CM

## 2013-03-09 DIAGNOSIS — E039 Hypothyroidism, unspecified: Secondary | ICD-10-CM

## 2013-03-09 DIAGNOSIS — I10 Essential (primary) hypertension: Secondary | ICD-10-CM

## 2013-03-09 NOTE — Progress Notes (Signed)
Orders placed for f/u labs.  

## 2013-04-25 ENCOUNTER — Other Ambulatory Visit (INDEPENDENT_AMBULATORY_CARE_PROVIDER_SITE_OTHER): Payer: Medicare Other

## 2013-04-25 DIAGNOSIS — E7800 Pure hypercholesterolemia, unspecified: Secondary | ICD-10-CM

## 2013-04-25 DIAGNOSIS — E039 Hypothyroidism, unspecified: Secondary | ICD-10-CM

## 2013-04-25 DIAGNOSIS — I10 Essential (primary) hypertension: Secondary | ICD-10-CM

## 2013-04-25 LAB — BASIC METABOLIC PANEL
CO2: 26 mEq/L (ref 19–32)
Calcium: 9.6 mg/dL (ref 8.4–10.5)
Creatinine, Ser: 1.1 mg/dL (ref 0.4–1.2)
Glucose, Bld: 101 mg/dL — ABNORMAL HIGH (ref 70–99)

## 2013-04-25 LAB — LIPID PANEL: Total CHOL/HDL Ratio: 5

## 2013-04-25 LAB — TSH: TSH: 6.5 u[IU]/mL — ABNORMAL HIGH (ref 0.35–5.50)

## 2013-04-27 ENCOUNTER — Encounter: Payer: Self-pay | Admitting: Internal Medicine

## 2013-04-27 ENCOUNTER — Ambulatory Visit (INDEPENDENT_AMBULATORY_CARE_PROVIDER_SITE_OTHER): Payer: Medicare Other | Admitting: Internal Medicine

## 2013-04-27 VITALS — BP 130/80 | HR 110 | Temp 98.0°F | Ht 59.0 in | Wt 140.2 lb

## 2013-04-27 DIAGNOSIS — I10 Essential (primary) hypertension: Secondary | ICD-10-CM

## 2013-04-27 DIAGNOSIS — Z86010 Personal history of colonic polyps: Secondary | ICD-10-CM

## 2013-04-27 DIAGNOSIS — E039 Hypothyroidism, unspecified: Secondary | ICD-10-CM

## 2013-04-27 DIAGNOSIS — K219 Gastro-esophageal reflux disease without esophagitis: Secondary | ICD-10-CM

## 2013-04-27 DIAGNOSIS — Z800 Family history of malignant neoplasm of digestive organs: Secondary | ICD-10-CM

## 2013-04-27 DIAGNOSIS — Z23 Encounter for immunization: Secondary | ICD-10-CM

## 2013-04-27 MED ORDER — LEVOTHYROXINE SODIUM 75 MCG PO TABS: 75.0000 ug | ORAL_TABLET | Freq: Every day | ORAL | Status: DC

## 2013-04-27 NOTE — Progress Notes (Signed)
  Subjective:    Patient ID: Ann Perkins, female    DOB: 1946/02/25, 67 y.o.   MRN: [id number]  HPI 67 year old female with past history of colon polyps, GERD and elevated blood pressure.  She comes in today to follow up on these issues as well as for a complete physical exam.    On Lisinopril 15mg  q day.  Blood pressures averaging 116-132/60-70s.  Overall feels good.  Was started on synthroid after labs returned with an elevated tsh.  Tolerating.  Stays active.  No cardiac symptoms with increased activity or exertion.  Some congestion.  Saline helping.     Past Medical History  Diagnosis Date  . Hemorrhoids   . Allergy   . GERD (gastroesophageal reflux disease)   . History of colon polyps   . Hypertension     Outpatient Encounter Prescriptions as of 04/27/2013  Medication Sig Dispense Refill  . levothyroxine (SYNTHROID) 50 MCG tablet Take 1 tablet (50 mcg total) by mouth daily before breakfast. Name brand only  30 tablet  10  . lisinopril (PRINIVIL,ZESTRIL) 5 MG tablet Take three tablets q day  90 tablet  5  . sodium chloride (OCEAN) 0.65 % nasal spray Place 1 spray into the nose as needed for congestion.       No facility-administered encounter medications on file as of 04/27/2013.    Review of Systems Patient denies any headache, lightheadedness or dizziness.  No significant sinus or allergy symptoms.  No chest pain, tightness or palpitations.  No increased shortness of breath, cough or congestion.  No nausea or vomiting.  No significant acid reflux.  No abdominal pain or cramping.  No BRBPR or melana.  No significant constipation.  No urine change.  Tolerating the lisinopril.  Blood pressures as outlined.       Objective:   Physical Exam  Filed Vitals:   04/27/13 0930  BP: 130/80  Pulse: 110  Temp: 98 F (36.7 C)   Pulse 11  67 year old female in no acute distress.   HEENT:  Nares- clear.  Oropharynx - without lesions. NECK:  Supple.  Nontender.  No audible bruit.   HEART:  Appears to be regular. LUNGS:  No crackles or wheezing audible.  Respirations even and unlabored.  RADIAL PULSE:  Equal bilaterally.    BREASTS:  No nipple discharge or nipple retraction present.  Could not appreciate any distinct nodules or axillary adenopathy.  ABDOMEN:  Soft, nontender.  Bowel sounds present and normal.  No audible abdominal bruit.  GU:  Normal external genitalia.  Vaginal vault without lesions.  Cervix identified.  Pap performed. Could not appreciate any adnexal masses or tenderness.   RECTAL:  Heme negative.   EXTREMITIES:  No increased edema present.  DP pulses palpable and equal bilaterally.          Assessment & Plan:  ENT.  S/P mouth surgery for mild dysplasia.  Second surgery - margins clear.  Released to follow up with her dentist.    HEALTH MAINTENANCE.  Physical today.   Had colonoscopy.  (5/13).  Recommended follow up colonoscopy 2016.  Declines mammogram.  Declines pneumovax.

## 2013-04-30 ENCOUNTER — Encounter: Payer: Self-pay | Admitting: Internal Medicine

## 2013-04-30 NOTE — Assessment & Plan Note (Signed)
Controlled if she watches what she eats.  Follow.     

## 2013-04-30 NOTE — Assessment & Plan Note (Signed)
Up to date with colonoscopy as outlined.  Follow.   

## 2013-04-30 NOTE — Assessment & Plan Note (Addendum)
On lisinopril 15mg  q day.  Tolerating.  Blood pressure as outlined.  Improved. Follow metabolic panel.  Follow pressures.  Pressures averaging 111-130/70s on outside checks.

## 2013-04-30 NOTE — Assessment & Plan Note (Signed)
Started on synthroid.  Follow tsh.  tsh just elevated.  Synthroid increased to q day.  Recheck tsh in 6 weeks.

## 2013-07-05 ENCOUNTER — Ambulatory Visit (INDEPENDENT_AMBULATORY_CARE_PROVIDER_SITE_OTHER): Payer: Medicare Other | Admitting: Internal Medicine

## 2013-07-05 ENCOUNTER — Other Ambulatory Visit: Payer: Self-pay | Admitting: Internal Medicine

## 2013-07-05 ENCOUNTER — Encounter: Payer: Self-pay | Admitting: Internal Medicine

## 2013-07-05 ENCOUNTER — Encounter: Payer: Self-pay | Admitting: *Deleted

## 2013-07-05 VITALS — BP 140/80 | HR 90 | Temp 98.1°F | Ht 59.0 in

## 2013-07-05 DIAGNOSIS — K219 Gastro-esophageal reflux disease without esophagitis: Secondary | ICD-10-CM

## 2013-07-05 DIAGNOSIS — Z860100 Personal history of colon polyps, unspecified: Secondary | ICD-10-CM

## 2013-07-05 DIAGNOSIS — E039 Hypothyroidism, unspecified: Secondary | ICD-10-CM

## 2013-07-05 DIAGNOSIS — I10 Essential (primary) hypertension: Secondary | ICD-10-CM

## 2013-07-05 LAB — BASIC METABOLIC PANEL
BUN: 15 mg/dL (ref 6–23)
CO2: 26 mEq/L (ref 19–32)
Chloride: 107 mEq/L (ref 96–112)
Creatinine, Ser: 1 mg/dL (ref 0.4–1.2)
GFR: 56.37 mL/min — ABNORMAL LOW (ref >60.00)
Glucose, Bld: 87 mg/dL (ref 70–99)

## 2013-07-05 NOTE — Progress Notes (Signed)
Pre-visit discussion using our clinic review tool. No additional management support is needed unless otherwise documented below in the visit note.  

## 2013-07-05 NOTE — Progress Notes (Signed)
Labs ordered.

## 2013-07-09 ENCOUNTER — Encounter: Payer: Self-pay | Admitting: Internal Medicine

## 2013-07-09 NOTE — Assessment & Plan Note (Signed)
Up to date with colonoscopy as outlined.  Follow.  Due f/u colonoscopy 2016.   

## 2013-07-09 NOTE — Progress Notes (Signed)
  Subjective:    Patient ID: Arlana Lindau, female    DOB: 28-Apr-1946, 67 y.o.   MRN: [id number]  HPI 67 year old female with past history of colon polyps, GERD and elevated blood pressure.  She comes in today for a scheduled follow up.  On Lisinopril 15mg  q day.  Blood pressures averaging 116-132/60-70s.  Overall feels good.  Was started on synthroid after labs returned with an elevated tsh.  Tolerating.  Stays active.  No cardiac symptoms with increased activity or exertion.     Past Medical History  Diagnosis Date  . Hemorrhoids   . Allergy   . GERD (gastroesophageal reflux disease)   . History of colon polyps   . Hypertension     Outpatient Encounter Prescriptions as of 07/05/2013  Medication Sig  . levothyroxine (SYNTHROID, LEVOTHROID) 75 MCG tablet Take 1 tablet (75 mcg total) by mouth daily.  Marland Kitchen lisinopril (PRINIVIL,ZESTRIL) 5 MG tablet Take three tablets q day  . sodium chloride (OCEAN) 0.65 % nasal spray Place 1 spray into the nose as needed for congestion.    Review of Systems Patient denies any headache, lightheadedness or dizziness.  No significant sinus or allergy symptoms.  No chest pain, tightness or palpitations.  No increased shortness of breath, cough or congestion.  No nausea or vomiting.  No acid reflux.  No abdominal pain or cramping.  No BRBPR or melana.  No significant constipation.  No urine change.  Tolerating the lisinopril.  Blood pressures attached.  Averaging 120-150/70-80s.        Objective:   Physical Exam  Filed Vitals:   07/05/13 1003  BP: 140/80  Pulse: 90  Temp: 98.1 F (36.7 C)   Pulse 88, blood pressure recheck:  69/50  67 year old female in no acute distress.   HEENT:  Nares- clear.  Oropharynx - without lesions. NECK:  Supple.  Nontender.  No audible bruit.  HEART:  Appears to be regular. LUNGS:  No crackles or wheezing audible.  Respirations even and unlabored.  RADIAL PULSE:  Equal bilaterally.  ABDOMEN:  Soft, nontender.  Bowel  sounds present and normal.  No audible abdominal bruit.  EXTREMITIES:  No increased edema present.  DP pulses palpable and equal bilaterally.          Assessment & Plan:  ENT.  S/P mouth surgery for mild dysplasia.  Second surgery - margins clear.  Released to follow up with her dentist.    HEALTH MAINTENANCE.  Physical 04/27/13.   Had colonoscopy.  (5/13).  Recommended follow up colonoscopy 2016.  Declines mammogram.  Declines pneumovax.

## 2013-07-09 NOTE — Assessment & Plan Note (Signed)
On lisinopril 15mg q day.  Tolerating.  Blood pressure as outlined.  Follow metabolic panel.  Follow pressures.  Wanted to increase the lisinopril to 20mg q day.  She declines.  Follow.     

## 2013-07-09 NOTE — Assessment & Plan Note (Signed)
Started on synthroid.  Follow tsh.  tsh just elevated.  Synthroid increased to q day.  Recheck tsh today.

## 2013-07-09 NOTE — Assessment & Plan Note (Signed)
Controlled if she watches what she eats.  Follow.     

## 2013-07-25 ENCOUNTER — Other Ambulatory Visit: Payer: Self-pay | Admitting: *Deleted

## 2013-07-26 MED ORDER — LEVOTHYROXINE SODIUM 75 MCG PO TABS: 75.0000 ug | ORAL_TABLET | Freq: Every day | ORAL | Status: DC

## 2013-08-11 ENCOUNTER — Other Ambulatory Visit: Payer: Self-pay | Admitting: *Deleted

## 2013-08-11 MED ORDER — LISINOPRIL 5 MG PO TABS: ORAL_TABLET | ORAL | Status: DC

## 2013-10-05 ENCOUNTER — Encounter: Payer: Self-pay | Admitting: Internal Medicine

## 2013-10-05 ENCOUNTER — Ambulatory Visit (INDEPENDENT_AMBULATORY_CARE_PROVIDER_SITE_OTHER): Payer: Medicare Other | Admitting: Internal Medicine

## 2013-10-05 VITALS — BP 130/80 | HR 94 | Temp 98.1°F | Ht 59.0 in | Wt 143.5 lb

## 2013-10-05 DIAGNOSIS — E7800 Pure hypercholesterolemia, unspecified: Secondary | ICD-10-CM

## 2013-10-05 DIAGNOSIS — I10 Essential (primary) hypertension: Secondary | ICD-10-CM

## 2013-10-05 DIAGNOSIS — Z86010 Personal history of colonic polyps: Secondary | ICD-10-CM

## 2013-10-05 DIAGNOSIS — E039 Hypothyroidism, unspecified: Secondary | ICD-10-CM

## 2013-10-05 DIAGNOSIS — K219 Gastro-esophageal reflux disease without esophagitis: Secondary | ICD-10-CM

## 2013-10-05 NOTE — Assessment & Plan Note (Addendum)
On lisinopril 15mg  q day.  Tolerating.  Blood pressure as outlined.  Follow metabolic panel.  Follow pressures.  Wanted to increase the lisinopril to 20mg  q day.  She declines.  Follow.

## 2013-10-05 NOTE — Progress Notes (Signed)
  Subjective:    Patient ID: Ann Perkins, female    DOB: [birth year], 68 y.o.   MRN: [id number]  HPI 68 year old female with past history of colon polyps, GERD and elevated blood pressure.  She comes in today for a scheduled follow up.  On Lisinopril 15mg  q day.  Blood pressures averaging 118-140/60-70s.  Overall feels good.  Was started on synthroid after labs returned with an elevated tsh.  Tolerating.  Stays active.  No cardiac symptoms with increased activity or exertion.     Past Medical History  Diagnosis Date  . Hemorrhoids   . Allergy   . GERD (gastroesophageal reflux disease)   . History of colon polyps   . Hypertension     Outpatient Encounter Prescriptions as of 10/05/2013  Medication Sig  . levothyroxine (SYNTHROID, LEVOTHROID) 75 MCG tablet Take 1 tablet (75 mcg total) by mouth daily.  Marland Kitchen lisinopril (PRINIVIL,ZESTRIL) 5 MG tablet Take three tablets q day  . sodium chloride (OCEAN) 0.65 % nasal spray Place 1 spray into the nose as needed for congestion.    Review of Systems Patient denies any headache, lightheadedness or dizziness.  No significant sinus or allergy symptoms.  No chest pain, tightness or palpitations.  No increased shortness of breath, cough or congestion.  No nausea or vomiting.  No acid reflux. No abdominal pain or cramping.  No BRBPR or melana.  No significant constipation.  No urine change.  Tolerating the lisinopril.  Blood pressures attached.        Objective:   Physical Exam  Filed Vitals:   10/05/13 0906  BP: 130/80  Pulse: 94  Temp: 98.1 F (36.7 C)   Pulse 88, blood pressure recheck:  94/70  68 year old female in no acute distress.   HEENT:  Nares- clear.  Oropharynx - without lesions. NECK:  Supple.  Nontender.  No audible bruit.  HEART:  Appears to be regular. LUNGS:  No crackles or wheezing audible.  Respirations even and unlabored.  RADIAL PULSE:  Equal bilaterally.  ABDOMEN:  Soft, nontender.  Bowel sounds present and normal.  No  audible abdominal bruit.  EXTREMITIES:  No increased edema present.  DP pulses palpable and equal bilaterally.          Assessment & Plan:  ENT.  S/P mouth surgery for mild dysplasia.  Second surgery - margins clear.  Released to follow up with her dentist.    HEALTH MAINTENANCE.  Physical 04/27/13.   Had colonoscopy.  (5/13).  Recommended follow up colonoscopy 2016.  Declines mammogram.  Declines pneumovax.

## 2013-10-05 NOTE — Progress Notes (Signed)
Pre-visit discussion using our clinic review tool. No additional management support is needed unless otherwise documented below in the visit note.  

## 2013-10-05 NOTE — Assessment & Plan Note (Addendum)
On synthroid.  Follow tsh.   

## 2013-10-05 NOTE — Assessment & Plan Note (Addendum)
Controlled if she watches what she eats.  Follow.

## 2013-10-08 ENCOUNTER — Encounter: Payer: Self-pay | Admitting: Internal Medicine

## 2013-10-08 NOTE — Assessment & Plan Note (Signed)
Up to date with colonoscopy as outlined.  Follow.  Due f/u colonoscopy 2016.   

## 2013-10-19 ENCOUNTER — Other Ambulatory Visit (INDEPENDENT_AMBULATORY_CARE_PROVIDER_SITE_OTHER): Payer: Medicare Other

## 2013-10-19 DIAGNOSIS — E039 Hypothyroidism, unspecified: Secondary | ICD-10-CM

## 2013-10-19 DIAGNOSIS — I10 Essential (primary) hypertension: Secondary | ICD-10-CM

## 2013-10-19 DIAGNOSIS — K219 Gastro-esophageal reflux disease without esophagitis: Secondary | ICD-10-CM

## 2013-10-19 DIAGNOSIS — E7800 Pure hypercholesterolemia, unspecified: Secondary | ICD-10-CM

## 2013-10-19 LAB — COMPREHENSIVE METABOLIC PANEL
ALBUMIN: 3.9 g/dL (ref 3.5–5.2)
ALT: 21 U/L (ref 0–35)
AST: 22 U/L (ref 0–37)
Alkaline Phosphatase: 54 U/L (ref 39–117)
BUN: 21 mg/dL (ref 6–23)
CHLORIDE: 107 meq/L (ref 96–112)
CO2: 23 mEq/L (ref 19–32)
CREATININE: 1.3 mg/dL — AB (ref 0.4–1.2)
Calcium: 9.4 mg/dL (ref 8.4–10.5)
GFR: 45.55 mL/min — ABNORMAL LOW (ref >60.00)
Glucose, Bld: 110 mg/dL — ABNORMAL HIGH (ref 70–99)
Potassium: 4.8 mEq/L (ref 3.5–5.1)
Sodium: 139 mEq/L (ref 135–145)
TOTAL PROTEIN: 7.5 g/dL (ref 6.0–8.3)
Total Bilirubin: 0.9 mg/dL (ref 0.3–1.2)

## 2013-10-19 LAB — CBC WITH DIFFERENTIAL/PLATELET
BASOS PCT: 0.4 % (ref 0.0–3.0)
Basophils Absolute: 0 10*3/uL (ref 0.0–0.1)
EOS PCT: 2.4 % (ref 0.0–5.0)
Eosinophils Absolute: 0.1 10*3/uL (ref 0.0–0.7)
HEMATOCRIT: 38.1 % (ref 36.0–46.0)
HEMOGLOBIN: 13 g/dL (ref 12.0–15.0)
LYMPHS ABS: 1.4 10*3/uL (ref 0.7–4.0)
Lymphocytes Relative: 26.4 % (ref 12.0–46.0)
MCHC: 34.1 g/dL (ref 30.0–36.0)
MCV: 90.7 fl (ref 78.0–100.0)
MONOS PCT: 8.5 % (ref 3.0–12.0)
Monocytes Absolute: 0.4 10*3/uL (ref 0.1–1.0)
NEUTROS ABS: 3.3 10*3/uL (ref 1.4–7.7)
Neutrophils Relative %: 62.3 % (ref 43.0–77.0)
Platelets: 188 10*3/uL (ref 150.0–400.0)
RBC: 4.21 Mil/uL (ref 3.87–5.11)
RDW: 13.2 % (ref 11.5–14.6)
WBC: 5.2 10*3/uL (ref 4.5–10.5)

## 2013-10-19 LAB — LIPID PANEL
Cholesterol: 188 mg/dL (ref 0–200)
HDL: 35.4 mg/dL — ABNORMAL LOW (ref >39.00)
LDL CALC: 134 mg/dL — AB (ref 0–99)
Total CHOL/HDL Ratio: 5
Triglycerides: 95 mg/dL (ref 0.0–149.0)
VLDL: 19 mg/dL (ref 0.0–40.0)

## 2013-10-19 LAB — TSH: TSH: 0.54 u[IU]/mL (ref 0.35–5.50)

## 2013-10-23 ENCOUNTER — Other Ambulatory Visit: Payer: Self-pay | Admitting: Internal Medicine

## 2013-10-23 DIAGNOSIS — N289 Disorder of kidney and ureter, unspecified: Secondary | ICD-10-CM

## 2013-10-23 NOTE — Progress Notes (Unsigned)
Orders placed for labs

## 2013-10-24 ENCOUNTER — Encounter: Payer: Self-pay | Admitting: *Deleted

## 2013-10-30 ENCOUNTER — Other Ambulatory Visit (INDEPENDENT_AMBULATORY_CARE_PROVIDER_SITE_OTHER): Payer: Medicare Other

## 2013-10-30 DIAGNOSIS — N289 Disorder of kidney and ureter, unspecified: Secondary | ICD-10-CM

## 2013-10-30 LAB — URINALYSIS, ROUTINE W REFLEX MICROSCOPIC
Bilirubin Urine: NEGATIVE
HGB URINE DIPSTICK: NEGATIVE
Ketones, ur: NEGATIVE
Leukocytes, UA: NEGATIVE
NITRITE: NEGATIVE
PH: 6 (ref 5.0–8.0)
RBC / HPF: NONE SEEN (ref 0–?)
TOTAL PROTEIN, URINE-UPE24: NEGATIVE
URINE GLUCOSE: NEGATIVE
Urobilinogen, UA: 0.2 (ref 0.0–1.0)
WBC UA: NONE SEEN (ref 0–?)

## 2013-10-30 LAB — BASIC METABOLIC PANEL
BUN: 16 mg/dL (ref 6–23)
CO2: 26 meq/L (ref 19–32)
CREATININE: 1 mg/dL (ref 0.4–1.2)
Calcium: 9.7 mg/dL (ref 8.4–10.5)
Chloride: 105 mEq/L (ref 96–112)
GFR: 59.61 mL/min — ABNORMAL LOW (ref >60.00)
Glucose, Bld: 99 mg/dL (ref 70–99)
POTASSIUM: 4.8 meq/L (ref 3.5–5.1)
SODIUM: 140 meq/L (ref 135–145)

## 2013-10-31 ENCOUNTER — Telehealth: Payer: Self-pay | Admitting: Internal Medicine

## 2013-10-31 NOTE — Telephone Encounter (Signed)
Please address with pt.

## 2013-10-31 NOTE — Telephone Encounter (Signed)
Pt called with questions regarding billing for her wellness exam on 04/27/2013.  States she received a bill for $20.00 for an Office Visit but should have only had a Wellness Visit.  Advised pt she may have discussed more issues than are included in annual physical examination.  Pt states she does not understand why her history cannot be discussed at her annual office visit.  Advised pt I would mail an informational packet regarding "types of visits".  Pt agrees.  Pt did ask that Dr. Nicki Reaper be aware that she does not understand the billing for the wellness visit or why she was charged $20.00 for an office visit.  Visit information mailed to pt.

## 2014-02-05 ENCOUNTER — Encounter: Payer: Self-pay | Admitting: Internal Medicine

## 2014-02-05 ENCOUNTER — Ambulatory Visit (INDEPENDENT_AMBULATORY_CARE_PROVIDER_SITE_OTHER): Payer: Medicare Other | Admitting: Internal Medicine

## 2014-02-05 VITALS — BP 140/70 | HR 86 | Temp 98.2°F | Ht 59.0 in | Wt 144.0 lb

## 2014-02-05 DIAGNOSIS — I10 Essential (primary) hypertension: Secondary | ICD-10-CM

## 2014-02-05 DIAGNOSIS — E039 Hypothyroidism, unspecified: Secondary | ICD-10-CM

## 2014-02-05 DIAGNOSIS — Z86010 Personal history of colonic polyps: Secondary | ICD-10-CM

## 2014-02-05 DIAGNOSIS — E7800 Pure hypercholesterolemia, unspecified: Secondary | ICD-10-CM | POA: Insufficient documentation

## 2014-02-05 DIAGNOSIS — Z800 Family history of malignant neoplasm of digestive organs: Secondary | ICD-10-CM

## 2014-02-05 LAB — LIPID PANEL
CHOL/HDL RATIO: 5
CHOLESTEROL: 172 mg/dL (ref 0–200)
HDL: 32.4 mg/dL — ABNORMAL LOW (ref >39.00)
LDL CALC: 104 mg/dL — AB (ref 0–99)
NonHDL: 139.6
Triglycerides: 180 mg/dL — ABNORMAL HIGH (ref 0.0–149.0)
VLDL: 36 mg/dL (ref 0.0–40.0)

## 2014-02-05 LAB — COMPREHENSIVE METABOLIC PANEL
ALK PHOS: 55 U/L (ref 39–117)
ALT: 20 U/L (ref 0–35)
AST: 24 U/L (ref 0–37)
Albumin: 3.9 g/dL (ref 3.5–5.2)
BUN: 20 mg/dL (ref 6–23)
CO2: 25 mEq/L (ref 19–32)
Calcium: 9.3 mg/dL (ref 8.4–10.5)
Chloride: 107 mEq/L (ref 96–112)
Creatinine, Ser: 1.3 mg/dL — ABNORMAL HIGH (ref 0.4–1.2)
GFR: 43.49 mL/min — ABNORMAL LOW (ref >60.00)
Glucose, Bld: 99 mg/dL (ref 70–99)
Potassium: 5 mEq/L (ref 3.5–5.1)
SODIUM: 137 meq/L (ref 135–145)
TOTAL PROTEIN: 7.2 g/dL (ref 6.0–8.3)
Total Bilirubin: 0.6 mg/dL (ref 0.2–1.2)

## 2014-02-05 LAB — TSH: TSH: 0.87 u[IU]/mL (ref 0.35–4.50)

## 2014-02-05 NOTE — Assessment & Plan Note (Signed)
On lisinopril 15mg  q day.  Tolerating.  Blood pressure as outlined.  Follow metabolic panel.  Follow pressures.

## 2014-02-05 NOTE — Assessment & Plan Note (Signed)
On synthroid.  Follow tsh.  Recheck today.

## 2014-02-05 NOTE — Assessment & Plan Note (Signed)
Up to date with colonoscopy as outlined.  Follow.  Due f/u colonoscopy 2016.

## 2014-02-05 NOTE — Assessment & Plan Note (Signed)
Up to date with colonoscopy as outlined.  Follow.

## 2014-02-05 NOTE — Progress Notes (Signed)
Pre visit review using our clinic review tool, if applicable. No additional management support is needed unless otherwise documented below in the visit note. 

## 2014-02-05 NOTE — Progress Notes (Signed)
  Subjective:    Patient ID: Ann Perkins, female    DOB: 06-16-1946, 68 y.o.   MRN: [id number]  HPI 68 year old female with past history of colon polyps, GERD and elevated blood pressure.  She comes in today for a scheduled follow up.  On Lisinopril 15mg  q day.  Blood pressures averaging 105-120s/60-70s (overall).    Overall feels good.  Was started on synthroid after labs returned with an elevated tsh.  Tolerating.  Stays active.  No cardiac symptoms with increased activity or exertion.  Eating and drinking well.  No nausea or vomiting.  No problems with acid reflux.     Past Medical History  Diagnosis Date  . Hemorrhoids   . Allergy   . GERD (gastroesophageal reflux disease)   . History of colon polyps   . Hypertension     Outpatient Encounter Prescriptions as of 02/05/2014  Medication Sig  . levothyroxine (SYNTHROID, LEVOTHROID) 75 MCG tablet Take 1 tablet (75 mcg total) by mouth daily.  Marland Kitchen lisinopril (PRINIVIL,ZESTRIL) 5 MG tablet Take three tablets q day  . sodium chloride (OCEAN) 0.65 % nasal spray Place 1 spray into the nose as needed for congestion.    Review of Systems Patient denies any headache.  Occasional lightheadedness.  Rare.  Not a significant issue for her.  We discussed eating regular meals and staying hydrated.   No significant sinus or allergy symptoms.  No chest pain, tightness or palpitations.  No increased shortness of breath, cough or congestion.  No nausea or vomiting.  No acid reflux. No abdominal pain or cramping.  No BRBPR or melana.  Bowels doing well per her report.   No urine change.  Tolerating the lisinopril.  Blood pressures attached.        Objective:   Physical Exam  Filed Vitals:   02/05/14 0956  BP: 140/70  Pulse: 86  Temp: 98.2 F (36.8 C)   Pulse 88, blood pressure recheck:  130/78, pulse 107  68 year old female in no acute distress.   HEENT:  Nares- clear.  Oropharynx - without lesions. NECK:  Supple.  Nontender.  No audible bruit.   HEART:  Appears to be regular. LUNGS:  No crackles or wheezing audible.  Respirations even and unlabored.  RADIAL PULSE:  Equal bilaterally.  ABDOMEN:  Soft, nontender.  Bowel sounds present and normal.  No audible abdominal bruit.  EXTREMITIES:  No increased edema present.  DP pulses palpable and equal bilaterally.          Assessment & Plan:  ENT.  S/P mouth surgery for mild dysplasia.  Second surgery - margins clear.  Released to follow up with her dentist.    HEALTH MAINTENANCE.  Physical 04/27/13.   Had colonoscopy.  (5/13).  Recommended follow up colonoscopy 2016.  Declines mammogram.  Declines pneumovax.  Again discussed mammogram today.  She declines.

## 2014-02-05 NOTE — Assessment & Plan Note (Signed)
Low cholesterol diet and exercise.  Follow lipid panel.   

## 2014-02-06 ENCOUNTER — Other Ambulatory Visit: Payer: Self-pay | Admitting: Internal Medicine

## 2014-02-06 DIAGNOSIS — N289 Disorder of kidney and ureter, unspecified: Secondary | ICD-10-CM

## 2014-02-06 NOTE — Progress Notes (Signed)
Order placed for f/u labs.  

## 2014-02-08 ENCOUNTER — Other Ambulatory Visit: Payer: Self-pay | Admitting: Internal Medicine

## 2014-02-12 ENCOUNTER — Other Ambulatory Visit (INDEPENDENT_AMBULATORY_CARE_PROVIDER_SITE_OTHER): Payer: Medicare Other

## 2014-02-12 DIAGNOSIS — N289 Disorder of kidney and ureter, unspecified: Secondary | ICD-10-CM

## 2014-02-12 LAB — CREATININE, SERUM: Creatinine, Ser: 1.2 mg/dL (ref 0.4–1.2)

## 2014-02-12 LAB — POTASSIUM: Potassium: 4.8 mEq/L (ref 3.5–5.1)

## 2014-02-13 ENCOUNTER — Encounter: Payer: Self-pay | Admitting: *Deleted

## 2014-03-15 LAB — HM DIABETES EYE EXAM

## 2014-04-04 ENCOUNTER — Encounter: Payer: Self-pay | Admitting: Internal Medicine

## 2014-06-11 ENCOUNTER — Encounter: Payer: Self-pay | Admitting: Internal Medicine

## 2014-06-11 ENCOUNTER — Ambulatory Visit (INDEPENDENT_AMBULATORY_CARE_PROVIDER_SITE_OTHER): Payer: Medicare Other | Admitting: Internal Medicine

## 2014-06-11 ENCOUNTER — Ambulatory Visit (INDEPENDENT_AMBULATORY_CARE_PROVIDER_SITE_OTHER): Payer: Medicare Other | Admitting: *Deleted

## 2014-06-11 ENCOUNTER — Encounter (INDEPENDENT_AMBULATORY_CARE_PROVIDER_SITE_OTHER): Payer: Self-pay

## 2014-06-11 VITALS — BP 130/80 | HR 85 | Temp 98.0°F | Ht 59.1 in | Wt 138.0 lb

## 2014-06-11 DIAGNOSIS — E039 Hypothyroidism, unspecified: Secondary | ICD-10-CM

## 2014-06-11 DIAGNOSIS — Z860100 Personal history of colon polyps, unspecified: Secondary | ICD-10-CM

## 2014-06-11 DIAGNOSIS — E7800 Pure hypercholesterolemia, unspecified: Secondary | ICD-10-CM

## 2014-06-11 DIAGNOSIS — I10 Essential (primary) hypertension: Secondary | ICD-10-CM

## 2014-06-11 DIAGNOSIS — Z23 Encounter for immunization: Secondary | ICD-10-CM

## 2014-06-11 LAB — LIPID PANEL
Cholesterol: 186 mg/dL (ref 0–200)
HDL: 39 mg/dL — ABNORMAL LOW (ref >39.00)
LDL Cholesterol: 115 mg/dL — ABNORMAL HIGH (ref 0–99)
NONHDL: 147
Total CHOL/HDL Ratio: 5
Triglycerides: 158 mg/dL — ABNORMAL HIGH (ref 0.0–149.0)
VLDL: 31.6 mg/dL (ref 0.0–40.0)

## 2014-06-11 LAB — COMPREHENSIVE METABOLIC PANEL
ALT: 21 U/L (ref 0–35)
AST: 23 U/L (ref 0–37)
Albumin: 4.3 g/dL (ref 3.5–5.2)
Alkaline Phosphatase: 58 U/L (ref 39–117)
BILIRUBIN TOTAL: 0.7 mg/dL (ref 0.2–1.2)
BUN: 15 mg/dL (ref 6–23)
CO2: 24 meq/L (ref 19–32)
CREATININE: 1.1 mg/dL (ref 0.4–1.2)
Calcium: 10 mg/dL (ref 8.4–10.5)
Chloride: 107 mEq/L (ref 96–112)
GFR: 51.6 mL/min — ABNORMAL LOW (ref >60.00)
GLUCOSE: 97 mg/dL (ref 70–99)
Potassium: 4.6 mEq/L (ref 3.5–5.1)
Sodium: 141 mEq/L (ref 135–145)
Total Protein: 8 g/dL (ref 6.0–8.3)

## 2014-06-11 LAB — TSH: TSH: 1.08 u[IU]/mL (ref 0.35–4.50)

## 2014-06-11 NOTE — Progress Notes (Signed)
  Subjective:    Patient ID: Ann Perkins, female    DOB: 1945-10-14, 68 y.o.   MRN: [id number]  HPI 68 year old female with past history of colon polyps, GERD and hypertension.  She comes in today to follow up on these issues as well as for a complete physical exam.  On Lisinopril 15mg  q day.  Blood pressures averaging 100-120s/60-70s (overall).    Overall feels good.  Was started on synthroid after labs returned with an elevated tsh.  Tolerating.  Stays active.  No cardiac symptoms with increased activity or exertion.  Eating and drinking well.  No nausea or vomiting.  No problems with acid reflux.  No bowel change.     Past Medical History  Diagnosis Date  . Hemorrhoids   . Allergy   . GERD (gastroesophageal reflux disease)   . History of colon polyps   . Hypertension     Outpatient Encounter Prescriptions as of 06/11/2014  Medication Sig  . levothyroxine (SYNTHROID, LEVOTHROID) 75 MCG tablet Take 1 tablet (75 mcg total) by mouth daily.  Marland Kitchen lisinopril (PRINIVIL,ZESTRIL) 5 MG tablet TAKE THREE TABLETS EVERY DAY  . sodium chloride (OCEAN) 0.65 % nasal spray Place 1 spray into the nose as needed for congestion.    Review of Systems Patient denies any headache.  No light headedness reported.   No sinus or allergy symptoms.  No chest pain, tightness or palpitations.  No increased shortness of breath, cough or congestion.  No nausea or vomiting.  No acid reflux. No abdominal pain or cramping.  No BRBPR or melana.  Bowels doing well.  No urine change.  Tolerating the lisinopril.  Blood pressures attached and as outlined.  Continues to decline mammogram.         Objective:   Physical Exam  Filed Vitals:   06/11/14 1036  BP: 130/80  Pulse: 85  Temp: 98 F (40.2 C)   68 year old female in no acute distress.   HEENT:  Nares- clear.  Oropharynx - without lesions. NECK:  Supple.  Nontender.  No audible bruit.  HEART:  Appears to be regular. LUNGS:  No crackles or wheezing audible.   Respirations even and unlabored.  RADIAL PULSE:  Equal bilaterally.    BREASTS:  No nipple discharge or nipple retraction present.  Could not appreciate any distinct nodules or axillary adenopathy.  ABDOMEN:  Soft, nontender.  Bowel sounds present and normal.  No audible abdominal bruit.  GU:  Not performed.    EXTREMITIES:  No increased edema present.  DP pulses palpable and equal bilaterally.         Assessment & Plan:  1. Essential hypertension, benign Blood pressure on her checks under good control.   - Comprehensive metabolic panel; Future  2. Hypothyroidism, unspecified hypothyroidism type On thyroid replacement.  Follow tsh.  - TSH; Future  3. History of colonic polyps Last colonoscopy 2013 as outlined.  Due f/u colonoscopy 2016.   4. Hypercholesterolemia Low cholesterol diet and exercise.   - Lipid panel; Future  5. ENT.  S/P mouth surgery for mild dysplasia.  Second surgery - margins clear.  Released to follow up with her dentist.    HEALTH MAINTENANCE.  Physical today.   Had colonoscopy.  (5/13).  Recommended follow up colonoscopy 2016.  Declines mammogram.  Declines pneumovax.  Again discussed mammogram today.  She declines.

## 2014-06-11 NOTE — Progress Notes (Signed)
Pre visit review using our clinic review tool, if applicable. No additional management support is needed unless otherwise documented below in the visit note. 

## 2014-06-12 ENCOUNTER — Encounter: Payer: Self-pay | Admitting: *Deleted

## 2014-06-16 ENCOUNTER — Encounter: Payer: Self-pay | Admitting: Internal Medicine

## 2014-07-20 ENCOUNTER — Other Ambulatory Visit: Payer: Self-pay | Admitting: Internal Medicine

## 2014-07-20 HISTORY — PX: COLONOSCOPY: SHX174

## 2014-08-07 ENCOUNTER — Other Ambulatory Visit: Payer: Self-pay | Admitting: Internal Medicine

## 2014-08-23 ENCOUNTER — Telehealth: Payer: Self-pay | Admitting: *Deleted

## 2014-08-23 NOTE — Telephone Encounter (Signed)
Fax from pharmacy, needing PA for Synthroid. Started online, pending response.

## 2014-10-10 ENCOUNTER — Ambulatory Visit (INDEPENDENT_AMBULATORY_CARE_PROVIDER_SITE_OTHER): Payer: Medicare Other | Admitting: Internal Medicine

## 2014-10-10 ENCOUNTER — Encounter: Payer: Self-pay | Admitting: Internal Medicine

## 2014-10-10 VITALS — BP 157/83 | HR 86 | Temp 97.7°F | Ht 59.1 in | Wt 139.4 lb

## 2014-10-10 DIAGNOSIS — E7800 Pure hypercholesterolemia, unspecified: Secondary | ICD-10-CM

## 2014-10-10 DIAGNOSIS — I10 Essential (primary) hypertension: Secondary | ICD-10-CM

## 2014-10-10 DIAGNOSIS — Z1211 Encounter for screening for malignant neoplasm of colon: Secondary | ICD-10-CM

## 2014-10-10 DIAGNOSIS — Z0000 Encounter for general adult medical examination without abnormal findings: Secondary | ICD-10-CM

## 2014-10-10 DIAGNOSIS — E039 Hypothyroidism, unspecified: Secondary | ICD-10-CM | POA: Diagnosis not present

## 2014-10-10 DIAGNOSIS — Z86010 Personal history of colonic polyps: Secondary | ICD-10-CM

## 2014-10-10 LAB — LIPID PANEL
CHOLESTEROL: 181 mg/dL (ref 0–200)
HDL: 38.9 mg/dL — AB (ref >39.00)
LDL Cholesterol: 117 mg/dL — ABNORMAL HIGH (ref 0–99)
NonHDL: 142.1
Total CHOL/HDL Ratio: 5
Triglycerides: 125 mg/dL (ref 0.0–149.0)
VLDL: 25 mg/dL (ref 0.0–40.0)

## 2014-10-10 LAB — COMPREHENSIVE METABOLIC PANEL
ALBUMIN: 4 g/dL (ref 3.5–5.2)
ALT: 16 U/L (ref 0–35)
AST: 21 U/L (ref 0–37)
Alkaline Phosphatase: 59 U/L (ref 39–117)
BUN: 15 mg/dL (ref 6–23)
CALCIUM: 9.7 mg/dL (ref 8.4–10.5)
CO2: 29 mEq/L (ref 19–32)
CREATININE: 1.1 mg/dL (ref 0.40–1.20)
Chloride: 105 mEq/L (ref 96–112)
GFR: 52.63 mL/min — ABNORMAL LOW (ref >60.00)
Glucose, Bld: 106 mg/dL — ABNORMAL HIGH (ref 70–99)
POTASSIUM: 4.5 meq/L (ref 3.5–5.1)
SODIUM: 137 meq/L (ref 135–145)
TOTAL PROTEIN: 7.2 g/dL (ref 6.0–8.3)
Total Bilirubin: 0.6 mg/dL (ref 0.2–1.2)

## 2014-10-10 LAB — TSH: TSH: 0.99 u[IU]/mL (ref 0.35–4.50)

## 2014-10-10 NOTE — Progress Notes (Signed)
Patient ID: Ann Perkins, female   DOB: 1945/12/16, 69 y.o.   MRN: [id number]   Subjective:    Patient ID: Ann Perkins, female    DOB: January 21, 1946, 69 y.o.   MRN: [id number]  HPI  Patient here for a scheduled follow up.  States she is doing well.  Tries to stay active.  No cardiac symptoms with increased activity or exertion.  Breathing stable.  Eating and drinking well.  Bowels stable.     Past Medical History  Diagnosis Date  . Hemorrhoids   . Allergy   . GERD (gastroesophageal reflux disease)   . History of colon polyps   . Hypertension     Current Outpatient Prescriptions on File Prior to Visit  Medication Sig Dispense Refill  . lisinopril (PRINIVIL,ZESTRIL) 5 MG tablet TAKE THREE TABLETS BY MOUTH EVERY DAY 90 tablet 5  . sodium chloride (OCEAN) 0.65 % nasal spray Place 1 spray into the nose as needed for congestion.    Marland Kitchen SYNTHROID 75 MCG tablet TAKE ONE TABLET BY MOUTH EVERY DAY 30 tablet 10   No current facility-administered medications on file prior to visit.    Review of Systems  Constitutional: Negative for appetite change and unexpected weight change.  HENT: Negative for congestion and sinus pressure.   Respiratory: Negative for cough, chest tightness and shortness of breath.   Cardiovascular: Negative for chest pain, palpitations and leg swelling.  Gastrointestinal: Negative for nausea, vomiting, abdominal pain and diarrhea.  Neurological: Negative for dizziness, light-headedness and headaches.       Objective:     Blood pressure recheck prior to leaving: 138-140/78, pulse 84  Physical Exam  Constitutional: She appears well-developed and well-nourished. No distress.  HENT:  Nose: Nose normal.  Mouth/Throat: Oropharynx is clear and moist.  Neck: Neck supple. No thyromegaly present.  Cardiovascular: Normal rate and regular rhythm.   Pulmonary/Chest: Breath sounds normal. No respiratory distress. She has no wheezes.  Abdominal: Soft. Bowel sounds are normal.  There is no tenderness.  Musculoskeletal: She exhibits no edema or tenderness.  Lymphadenopathy:    She has no cervical adenopathy.  Skin: No rash noted. No erythema.    BP 157/83 mmHg  Pulse 86  Temp(Src) 97.7 F (36.5 C) (Oral)  Ht 4' 11.1" (1.501 m)  Wt 139 lb 6 oz (63.22 kg)  BMI 28.06 kg/m2  SpO2 97%  LMP 07/21/1991 Wt Readings from Last 3 Encounters:  10/10/14 139 lb 6 oz (63.22 kg)  06/11/14 138 lb (62.596 kg)  02/05/14 144 lb (65.318 kg)     Lab Results  Component Value Date   WBC 5.2 10/19/2013   HGB 13.0 10/19/2013   HCT 38.1 10/19/2013   PLT 188.0 10/19/2013   GLUCOSE 106* 10/10/2014   CHOL 181 10/10/2014   TRIG 125.0 10/10/2014   HDL 38.90* 10/10/2014   LDLCALC 117* 10/10/2014   ALT 16 10/10/2014   AST 21 10/10/2014   NA 137 10/10/2014   K 4.5 10/10/2014   CL 105 10/10/2014   CREATININE 1.10 10/10/2014   BUN 15 10/10/2014   CO2 29 10/10/2014   TSH 0.99 10/10/2014       Assessment & Plan:   Problem List Items Addressed This Visit    Essential hypertension, benign    Blood pressure doing well.  See attached list.  Same medication regimen.  Follow metabolic panel.        Relevant Orders   Comprehensive metabolic panel (Completed)   Health care maintenance  Physical 06/11/14.  Colonoscopy 2013.  Due f/u in 2016.        History of colonic polyps    2013 - multiple colon polyps.  Recommended f/u colonoscopy in 2016.        Hypercholesterolemia    Low cholesterol diet and exercise.  Follow lipid panel.        Relevant Orders   Lipid panel (Completed)   Hypothyroidism    On thyroid replacement.  Follow tsh.        Relevant Orders   TSH (Completed)    Other Visit Diagnoses    Colon cancer screening    -  Primary    Relevant Orders    Ambulatory referral to Gastroenterology        Einar Pheasant, MD

## 2014-10-10 NOTE — Progress Notes (Signed)
Pre visit review using our clinic review tool, if applicable. No additional management support is needed unless otherwise documented below in the visit note. 

## 2014-10-11 ENCOUNTER — Encounter: Payer: Self-pay | Admitting: *Deleted

## 2014-10-15 ENCOUNTER — Other Ambulatory Visit: Payer: Self-pay | Admitting: Internal Medicine

## 2014-10-15 ENCOUNTER — Telehealth: Payer: Self-pay

## 2014-10-15 ENCOUNTER — Encounter: Payer: Self-pay | Admitting: Internal Medicine

## 2014-10-15 ENCOUNTER — Encounter: Payer: Self-pay | Admitting: Gastroenterology

## 2014-10-15 DIAGNOSIS — Z0000 Encounter for general adult medical examination without abnormal findings: Secondary | ICD-10-CM | POA: Insufficient documentation

## 2014-10-15 NOTE — Assessment & Plan Note (Signed)
On thyroid replacement.  Follow tsh.  

## 2014-10-15 NOTE — Telephone Encounter (Signed)
Pt.notified

## 2014-10-15 NOTE — Telephone Encounter (Signed)
The patient called hoping to get information on the medication she was prescribed at the walk in clinic on Friday for shingles.  She is hoping to discuss this medication with the nurse asap.   Callback - [phone]

## 2014-10-15 NOTE — Assessment & Plan Note (Signed)
Low cholesterol diet and exercise.  Follow lipid panel.   

## 2014-10-15 NOTE — Telephone Encounter (Signed)
Reviewed note in Care Everywhere.  It appears she was given valtrex for shingles.  This is an antiviral medication that helps to shorten the course of shingles.  Is not a cure.  shinges is caused from chicken pox virus and has to run its course.  I can see her to f/u this week if she desires.  Would avoid contact with pregnant women who have not had shingles.

## 2014-10-15 NOTE — Assessment & Plan Note (Signed)
2013 - multiple colon polyps.  Recommended f/u colonoscopy in 2016.

## 2014-10-15 NOTE — Assessment & Plan Note (Signed)
Blood pressure doing well.  See attached list.  Same medication regimen.  Follow metabolic panel.

## 2014-10-15 NOTE — Telephone Encounter (Signed)
Spoke with patient & she went to [facility] walk-in after breaking on out arm & hand. She wanted to know if she is contagious & if she needs to follow-up anytime soon with you? I have requested records from [facility].

## 2014-10-15 NOTE — Assessment & Plan Note (Signed)
Physical 06/11/14.  Colonoscopy 2013.  Due f/u in 2016.

## 2014-10-19 ENCOUNTER — Encounter: Payer: Self-pay | Admitting: Gastroenterology

## 2014-12-13 ENCOUNTER — Ambulatory Visit (AMBULATORY_SURGERY_CENTER): Payer: Self-pay | Admitting: *Deleted

## 2014-12-13 VITALS — Ht 59.0 in | Wt 135.6 lb

## 2014-12-13 DIAGNOSIS — Z86010 Personal history of colonic polyps: Secondary | ICD-10-CM

## 2014-12-13 MED ORDER — NA SULFATE-K SULFATE-MG SULF 17.5-3.13-1.6 GM/177ML PO SOLN: ORAL | Status: DC

## 2014-12-13 NOTE — Progress Notes (Signed)
No egg or soy allergy  No anesthesia or intubation problems per pt  No diet medications taken  Registered in EMMI   

## 2014-12-27 ENCOUNTER — Ambulatory Visit (AMBULATORY_SURGERY_CENTER): Payer: Medicare Other | Admitting: Gastroenterology

## 2014-12-27 ENCOUNTER — Encounter: Payer: Self-pay | Admitting: Gastroenterology

## 2014-12-27 VITALS — BP 138/89 | HR 77 | Temp 96.3°F | Resp 12 | Ht 59.0 in | Wt 135.0 lb

## 2014-12-27 DIAGNOSIS — D122 Benign neoplasm of ascending colon: Secondary | ICD-10-CM

## 2014-12-27 DIAGNOSIS — K635 Polyp of colon: Secondary | ICD-10-CM | POA: Diagnosis not present

## 2014-12-27 DIAGNOSIS — Z86010 Personal history of colonic polyps: Secondary | ICD-10-CM | POA: Diagnosis not present

## 2014-12-27 MED ORDER — SODIUM CHLORIDE 0.9 % IV SOLN: 500.0000 mL | INTRAVENOUS | Status: DC

## 2014-12-27 NOTE — Patient Instructions (Signed)
Discharge instructions given. Handout on polyps. Resume previous medications. YOU HAD AN ENDOSCOPIC PROCEDURE TODAY AT THE [facility]:   Refer to the procedure report that was given to you for any specific questions about what was found during the examination.  If the procedure report does not answer your questions, please call your gastroenterologist to clarify.  If you requested that your care partner not be given the details of your procedure findings, then the procedure report has been included in a sealed envelope for you to review at your convenience later.  YOU SHOULD EXPECT: Some feelings of bloating in the abdomen. Passage of more gas than usual.  Walking can help get rid of the air that was put into your GI tract during the procedure and reduce the bloating. If you had a lower endoscopy (such as a colonoscopy or flexible sigmoidoscopy) you may notice spotting of blood in your stool or on the toilet paper. If you underwent a bowel prep for your procedure, you may not have a normal bowel movement for a few days.  Please Note:  You might notice some irritation and congestion in your nose or some drainage.  This is from the oxygen used during your procedure.  There is no need for concern and it should clear up in a day or so.  SYMPTOMS TO REPORT IMMEDIATELY:   Following lower endoscopy (colonoscopy or flexible sigmoidoscopy):  Excessive amounts of blood in the stool  Significant tenderness or worsening of abdominal pains  Swelling of the abdomen that is new, acute  Fever of 100F or higher   For urgent or emergent issues, a gastroenterologist can be reached at any hour by calling [phone].   DIET: Your first meal following the procedure should be a small meal and then it is ok to progress to your normal diet. Heavy or fried foods are harder to digest and may make you feel nauseous or bloated.  Likewise, meals heavy in dairy and vegetables can increase bloating.  Drink  plenty of fluids but you should avoid alcoholic beverages for 24 hours.  ACTIVITY:  You should plan to take it easy for the rest of today and you should NOT DRIVE or use heavy machinery until tomorrow (because of the sedation medicines used during the test).    FOLLOW UP: Our staff will call the number listed on your records the next business day following your procedure to check on you and address any questions or concerns that you may have regarding the information given to you following your procedure. If we do not reach you, we will leave a message.  However, if you are feeling well and you are not experiencing any problems, there is no need to return our call.  We will assume that you have returned to your regular daily activities without incident.  If any biopsies were taken you will be contacted by phone or by letter within the next 1-3 weeks.  Please call us at [phone] if you have not heard about the biopsies in 3 weeks.    SIGNATURES/CONFIDENTIALITY: You and/or your care partner have signed paperwork which will be entered into your electronic medical record.  These signatures attest to the fact that that the information above on your After Visit Summary has been reviewed and is understood.  Full responsibility of the confidentiality of this discharge information lies with you and/or your care-partner. 

## 2014-12-27 NOTE — Op Note (Signed)
[facility]  [address] [place], [id number]   COLONOSCOPY PROCEDURE REPORT  PATIENT: Ann Perkins, Ann Perkins  MR#: [id number] BIRTHDATE: 1946/06/03 , 40  yrs. old GENDER: female ENDOSCOPIST: Inda Castle, MD REFERRED BY: PROCEDURE DATE:  12/27/2014 PROCEDURE:   Colonoscopy, surveillance First Screening Colonoscopy - Avg.  risk and is 50 yrs.  old or older - No.  Prior Negative Screening - Now for repeat screening. N/A  History of Adenoma - Now for follow-up colonoscopy & has been > or = to 3 yrs.  Yes hx of adenoma.  Has been 3 or more years since last colonoscopy.  Polyps removed today? Yes ASA CLASS:   Class II INDICATIONS:PH Colon Adenoma and FH Colon or Rectal Adenocarcinoma., multiple polyps 2013, MEDICATIONS: Monitored anesthesia care, Propofol 200 mg IV, and Lidocaine 40 mg IV  DESCRIPTION OF PROCEDURE:   After the risks benefits and alternatives of the procedure were thoroughly explained, informed consent was obtained.  The digital rectal exam revealed no abnormalities of the rectum.   The LB [id number]  endoscope was introduced through the anus and advanced to the cecum, which was identified by both the appendix and ileocecal valve. No adverse events experienced.   The quality of the prep was (Suprep was used) excellent.  The instrument was then slowly withdrawn as the colon was fully examined. Estimated blood loss is zero unless otherwise noted in this procedure report.      COLON FINDINGS: A sessile polyp measuring 6 mm in size was found in the ascending colon.  A polypectomy was performed with a cold snare.  The resection was complete, the polyp tissue was completely retrieved and sent to histology.   A sessile polyp measuring 3 mm in size was found in the ascending colon.  A polypectomy was performed with a cold snare.  The resection was complete, the polyp tissue was completely retrieved and sent to histology.   A sessile polyp measuring 2  mm in size was found in the ascending colon.  A polypectomy was performed with cold forceps.   Internal hemorrhoids were found.  Retroflexed views revealed no abnormalities. The time to cecum = 5.0 Withdrawal time = 10.4   The scope was withdrawn and the procedure completed. COMPLICATIONS: There were no immediate complications.  ENDOSCOPIC IMPRESSION: 1.   Sessile polyp was found in the ascending colon; polypectomy was performed with a cold snare 2.   Sessile polyp was found in the ascending colon; polypectomy was performed with a cold snare 3.   Sessile polyp was found in the ascending colon; polypectomy was performed with cold forceps 4.   Internal hemorrhoids  RECOMMENDATIONS: If the polyp(s) removed today are proven to be adenomatous (pre-cancerous) polyps, you will need a colonoscopy in 3 years. Otherwise he should have a follow-up colonoscopy in 5 years in view of your family history.  You will receive a letter within 1-2 weeks with the results of your biopsy as well as final recommendations. Please call my office if you have not received a letter after 3 weeks.  eSigned:  Inda Castle, MD 12/27/2014 11:31 AM   cc: Einar Pheasant, MD   PATIENT NAME:  Ann Perkins, Ann Perkins MR#: [id number]

## 2014-12-27 NOTE — Progress Notes (Signed)
Came to room after procedure was complete, float nurse was tied up in another room-adm

## 2014-12-27 NOTE — Progress Notes (Signed)
Stable to RR 

## 2014-12-28 ENCOUNTER — Telehealth: Payer: Self-pay

## 2014-12-28 NOTE — Telephone Encounter (Signed)
  Follow up Call-  Call back number 12/27/2014  Post procedure Call Back phone  # [phone]  Permission to leave phone message Yes     Patient questions:  Do you have a fever, pain , or abdominal swelling? No. Pain Score  0 *  Have you tolerated food without any problems? Yes.    Have you been able to return to your normal activities? Yes.    Do you have any questions about your discharge instructions: Diet   No. Medications  No. Follow up visit  No.  Do you have questions or concerns about your Care? No.  Actions: * If pain score is 4 or above: No action needed, pain <4.   No problems per the pt. maw

## 2015-01-02 ENCOUNTER — Encounter: Payer: Self-pay | Admitting: Gastroenterology

## 2015-01-16 LAB — HM COLONOSCOPY

## 2015-02-06 ENCOUNTER — Other Ambulatory Visit: Payer: Self-pay | Admitting: Internal Medicine

## 2015-02-18 ENCOUNTER — Ambulatory Visit (INDEPENDENT_AMBULATORY_CARE_PROVIDER_SITE_OTHER): Payer: Medicare Other | Admitting: Internal Medicine

## 2015-02-18 ENCOUNTER — Encounter: Payer: Self-pay | Admitting: Internal Medicine

## 2015-02-18 VITALS — BP 124/80 | HR 87 | Temp 98.7°F | Ht 59.1 in | Wt 135.4 lb

## 2015-02-18 DIAGNOSIS — Z800 Family history of malignant neoplasm of digestive organs: Secondary | ICD-10-CM

## 2015-02-18 DIAGNOSIS — E039 Hypothyroidism, unspecified: Secondary | ICD-10-CM | POA: Diagnosis not present

## 2015-02-18 DIAGNOSIS — I10 Essential (primary) hypertension: Secondary | ICD-10-CM

## 2015-02-18 DIAGNOSIS — Z0000 Encounter for general adult medical examination without abnormal findings: Secondary | ICD-10-CM

## 2015-02-18 DIAGNOSIS — E7800 Pure hypercholesterolemia, unspecified: Secondary | ICD-10-CM

## 2015-02-18 DIAGNOSIS — Z86010 Personal history of colonic polyps: Secondary | ICD-10-CM

## 2015-02-18 LAB — COMPREHENSIVE METABOLIC PANEL
ALT: 16 U/L (ref 0–35)
AST: 22 U/L (ref 0–37)
Albumin: 4 g/dL (ref 3.5–5.2)
Alkaline Phosphatase: 52 U/L (ref 39–117)
BUN: 14 mg/dL (ref 6–23)
CHLORIDE: 106 meq/L (ref 96–112)
CO2: 26 mEq/L (ref 19–32)
Calcium: 9.6 mg/dL (ref 8.4–10.5)
Creatinine, Ser: 1.09 mg/dL (ref 0.40–1.20)
GFR: 53.13 mL/min — AB (ref >60.00)
Glucose, Bld: 97 mg/dL (ref 70–99)
Potassium: 4.1 mEq/L (ref 3.5–5.1)
Sodium: 138 mEq/L (ref 135–145)
Total Bilirubin: 0.6 mg/dL (ref 0.2–1.2)
Total Protein: 7.3 g/dL (ref 6.0–8.3)

## 2015-02-18 LAB — CBC WITH DIFFERENTIAL/PLATELET
BASOS ABS: 0 10*3/uL (ref 0.0–0.1)
Basophils Relative: 0.3 % (ref 0.0–3.0)
Eosinophils Absolute: 0.1 10*3/uL (ref 0.0–0.7)
Eosinophils Relative: 1.4 % (ref 0.0–5.0)
HCT: 37.3 % (ref 36.0–46.0)
Hemoglobin: 12.6 g/dL (ref 12.0–15.0)
Lymphocytes Relative: 22.1 % (ref 12.0–46.0)
Lymphs Abs: 1.3 10*3/uL (ref 0.7–4.0)
MCHC: 33.8 g/dL (ref 30.0–36.0)
MCV: 91.1 fl (ref 78.0–100.0)
MONOS PCT: 6.5 % (ref 3.0–12.0)
Monocytes Absolute: 0.4 10*3/uL (ref 0.1–1.0)
NEUTROS ABS: 4 10*3/uL (ref 1.4–7.7)
NEUTROS PCT: 69.7 % (ref 43.0–77.0)
Platelets: 199 10*3/uL (ref 150.0–400.0)
RBC: 4.09 Mil/uL (ref 3.87–5.11)
RDW: 13.4 % (ref 11.5–15.5)
WBC: 5.7 10*3/uL (ref 4.0–10.5)

## 2015-02-18 LAB — LIPID PANEL
Cholesterol: 166 mg/dL (ref 0–200)
HDL: 36.4 mg/dL — AB (ref >39.00)
LDL CALC: 96 mg/dL (ref 0–99)
NONHDL: 130.08
Total CHOL/HDL Ratio: 5
Triglycerides: 172 mg/dL — ABNORMAL HIGH (ref 0.0–149.0)
VLDL: 34.4 mg/dL (ref 0.0–40.0)

## 2015-02-18 NOTE — Progress Notes (Signed)
Patient ID: Ann Perkins, female   DOB: 14-Jun-1946, 69 y.o.   MRN: [id number]   Subjective:    Patient ID: Ann Perkins, female    DOB: 08/27/1945, 69 y.o.   MRN: [id number]  HPI  Patient here for a scheduled follow up.  Stays active.  Denies any cardiac symptoms with increased activity or exertion.  No sob.  Eating and drinking well.  Bowels stable.  Blood pressure lower on outside checks.  No headache or dizziness.  Recently had shingles.  She describes as mild.  Involved her left arm and hand.  Resolved now.  No residual problems.     Past Medical History  Diagnosis Date  . Hemorrhoids   . Allergy   . GERD (gastroesophageal reflux disease)   . History of colon polyps   . Hypertension   . Thyroid disease     Outpatient Encounter Prescriptions as of 02/18/2015  Medication Sig  . lisinopril (PRINIVIL,ZESTRIL) 5 MG tablet TAKE THREE TABLETS EVERY DAY  . sodium chloride (OCEAN) 0.65 % nasal spray Place 1 spray into the nose as needed for congestion.  Marland Kitchen SYNTHROID 75 MCG tablet TAKE ONE TABLET BY MOUTH EVERY DAY   No facility-administered encounter medications on file as of 02/18/2015.    Review of Systems  Constitutional: Negative for appetite change and unexpected weight change.  HENT: Negative for congestion and sinus pressure.   Respiratory: Negative for cough, chest tightness and shortness of breath.   Cardiovascular: Negative for chest pain, palpitations and leg swelling.  Gastrointestinal: Negative for nausea, vomiting, abdominal pain and diarrhea.  Neurological: Negative for dizziness, light-headedness and headaches.  Psychiatric/Behavioral: Negative for dysphoric mood and agitation.       Objective:     Blood pressure recheck:  146/78  Physical Exam  Constitutional: She appears well-developed and well-nourished. No distress.  HENT:  Nose: Nose normal.  Mouth/Throat: Oropharynx is clear and moist.  Neck: Neck supple. No thyromegaly present.  Cardiovascular: Normal rate  and regular rhythm.   Pulmonary/Chest: Breath sounds normal. No respiratory distress. She has no wheezes.  Abdominal: Soft. Bowel sounds are normal. There is no tenderness.  Musculoskeletal: She exhibits no edema or tenderness.  Lymphadenopathy:    She has no cervical adenopathy.  Skin: No rash noted. No erythema.  Psychiatric: She has a normal mood and affect. Her behavior is normal.    BP 124/80 mmHg  Pulse 87  Temp(Src) 98.7 F (37.1 C) (Oral)  Ht 4' 11.1" (1.501 m)  Wt 135 lb 6 oz (61.406 kg)  BMI 27.26 kg/m2  SpO2 97%  LMP 07/21/1991 Wt Readings from Last 3 Encounters:  02/18/15 135 lb 6 oz (61.406 kg)  12/27/14 135 lb (61.236 kg)  12/13/14 135 lb 9.6 oz (61.508 kg)     Lab Results  Component Value Date   WBC 5.7 02/18/2015   HGB 12.6 02/18/2015   HCT 37.3 02/18/2015   PLT 199.0 02/18/2015   GLUCOSE 97 02/18/2015   CHOL 166 02/18/2015   TRIG 172.0* 02/18/2015   HDL 36.40* 02/18/2015   LDLCALC 96 02/18/2015   ALT 16 02/18/2015   AST 22 02/18/2015   NA 138 02/18/2015   K 4.1 02/18/2015   CL 106 02/18/2015   CREATININE 1.09 02/18/2015   BUN 14 02/18/2015   CO2 26 02/18/2015   TSH 0.99 10/10/2014       Assessment & Plan:   Problem List Items Addressed This Visit    Essential hypertension, benign - Primary  Blood pressure has been under good control.  Her outside checks are lower.  Continue same medication regimen.  Follow pressures.  Follow metabolic panel.        Relevant Orders   CBC with Differential/Platelet (Completed)   Comprehensive metabolic panel (Completed)   Lipid panel (Completed)   Family history of malignant neoplasm of gastrointestinal tract    Colonoscopy 12/27/14 - three sessile polyps - ascending colon - tubular adenoma and internal hemorrhoids.  Recommended f/u colonoscopy in three years.       Health care maintenance    Physical 06/11/14.  Colonoscopy 12/2014 - tubular adenoma.  Recommended f/u colonoscopy in three years.  Declines  mammogram.        History of colonic polyps    Colonoscopy 12/2014 - tubular adenoma.  Recommended f/u colonoscopy in three years.        Hypercholesterolemia    Low cholesterol diet and exercise.  Follow lipid panel.   Lab Results  Component Value Date   CHOL 166 02/18/2015   HDL 36.40* 02/18/2015   LDLCALC 96 02/18/2015   TRIG 172.0* 02/18/2015   CHOLHDL 5 02/18/2015        Hypothyroidism    On thyroid replacement.  Follow tsh.           Einar Pheasant, MD

## 2015-02-18 NOTE — Progress Notes (Signed)
Pre visit review using our clinic review tool, if applicable. No additional management support is needed unless otherwise documented below in the visit note. 

## 2015-02-19 ENCOUNTER — Encounter: Payer: Self-pay | Admitting: Internal Medicine

## 2015-02-19 ENCOUNTER — Encounter: Payer: Self-pay | Admitting: *Deleted

## 2015-02-19 NOTE — Assessment & Plan Note (Signed)
On thyroid replacement.  Follow tsh.  

## 2015-02-19 NOTE — Assessment & Plan Note (Signed)
Colonoscopy 12/27/14 - three sessile polyps - ascending colon - tubular adenoma and internal hemorrhoids.  Recommended f/u colonoscopy in three years.

## 2015-02-19 NOTE — Assessment & Plan Note (Signed)
Blood pressure has been under good control.  Her outside checks are lower.  Continue same medication regimen.  Follow pressures.  Follow metabolic panel.

## 2015-02-19 NOTE — Assessment & Plan Note (Signed)
Low cholesterol diet and exercise.  Follow lipid panel.   Lab Results  Component Value Date   CHOL 166 02/18/2015   HDL 36.40* 02/18/2015   LDLCALC 96 02/18/2015   TRIG 172.0* 02/18/2015   CHOLHDL 5 02/18/2015

## 2015-02-19 NOTE — Assessment & Plan Note (Signed)
Colonoscopy 12/2014 - tubular adenoma.  Recommended f/u colonoscopy in three years.

## 2015-02-19 NOTE — Assessment & Plan Note (Signed)
Physical 06/11/14.  Colonoscopy 12/2014 - tubular adenoma.  Recommended f/u colonoscopy in three years.  Declines mammogram.

## 2015-03-08 ENCOUNTER — Other Ambulatory Visit: Payer: Self-pay | Admitting: Internal Medicine

## 2015-03-18 ENCOUNTER — Telehealth: Payer: Self-pay | Admitting: Internal Medicine

## 2015-03-18 NOTE — Telephone Encounter (Signed)
Left message on Vm to return call

## 2015-03-18 NOTE — Telephone Encounter (Signed)
Please notify her that my schedule is shortened today secondary to first day of school.  I would like for her to go ahead and be evaluated today and then we will follow up after.

## 2015-03-18 NOTE — Telephone Encounter (Signed)
Pt states her side is hurting her due to her bowels not moving. Pt wanted to come in no avail appt showing. Thank You!

## 2015-03-18 NOTE — Telephone Encounter (Signed)
Spoke with pt, she only wants to see Dr Nicki Reaper. She states she is having left side pain, thought it was gas.  She also states she is constipated.  I advised her there were other providers in the office that had openings but she declined.  Please advise

## 2015-03-19 NOTE — Telephone Encounter (Signed)
Spoke with pt, she states she feels better today after starting a stool softener.  Pt states she will call back if she needs to be seen.

## 2015-05-08 ENCOUNTER — Other Ambulatory Visit: Payer: Self-pay | Admitting: Internal Medicine

## 2015-05-15 ENCOUNTER — Ambulatory Visit (INDEPENDENT_AMBULATORY_CARE_PROVIDER_SITE_OTHER): Payer: Medicare Other

## 2015-05-15 DIAGNOSIS — Z23 Encounter for immunization: Secondary | ICD-10-CM | POA: Diagnosis not present

## 2015-06-17 ENCOUNTER — Other Ambulatory Visit: Payer: Self-pay | Admitting: Internal Medicine

## 2015-06-18 ENCOUNTER — Ambulatory Visit (INDEPENDENT_AMBULATORY_CARE_PROVIDER_SITE_OTHER): Payer: Medicare Other | Admitting: Internal Medicine

## 2015-06-18 VITALS — BP 130/86 | HR 83 | Temp 98.2°F | Resp 18 | Ht 58.5 in | Wt 132.0 lb

## 2015-06-18 DIAGNOSIS — Z0000 Encounter for general adult medical examination without abnormal findings: Secondary | ICD-10-CM

## 2015-06-18 DIAGNOSIS — E7800 Pure hypercholesterolemia, unspecified: Secondary | ICD-10-CM | POA: Diagnosis not present

## 2015-06-18 DIAGNOSIS — Z860100 Personal history of colon polyps, unspecified: Secondary | ICD-10-CM

## 2015-06-18 DIAGNOSIS — E039 Hypothyroidism, unspecified: Secondary | ICD-10-CM

## 2015-06-18 DIAGNOSIS — I10 Essential (primary) hypertension: Secondary | ICD-10-CM

## 2015-06-18 LAB — HEPATIC FUNCTION PANEL
ALBUMIN: 4.2 g/dL (ref 3.5–5.2)
ALK PHOS: 56 U/L (ref 39–117)
ALT: 15 U/L (ref 0–35)
AST: 18 U/L (ref 0–37)
BILIRUBIN DIRECT: 0.1 mg/dL (ref 0.0–0.3)
TOTAL PROTEIN: 7.6 g/dL (ref 6.0–8.3)
Total Bilirubin: 0.6 mg/dL (ref 0.2–1.2)

## 2015-06-18 LAB — TSH: TSH: 1.62 u[IU]/mL (ref 0.35–4.50)

## 2015-06-18 LAB — BASIC METABOLIC PANEL
BUN: 15 mg/dL (ref 6–23)
CALCIUM: 10 mg/dL (ref 8.4–10.5)
CO2: 29 mEq/L (ref 19–32)
Chloride: 105 mEq/L (ref 96–112)
Creatinine, Ser: 1.09 mg/dL (ref 0.40–1.20)
GFR: 53.08 mL/min — AB (ref >60.00)
Glucose, Bld: 95 mg/dL (ref 70–99)
POTASSIUM: 4.5 meq/L (ref 3.5–5.1)
SODIUM: 140 meq/L (ref 135–145)

## 2015-06-18 LAB — LIPID PANEL
CHOL/HDL RATIO: 4
Cholesterol: 186 mg/dL (ref 0–200)
HDL: 41.7 mg/dL (ref >39.00)
LDL CALC: 122 mg/dL — AB (ref 0–99)
NONHDL: 144.51
Triglycerides: 115 mg/dL (ref 0.0–149.0)
VLDL: 23 mg/dL (ref 0.0–40.0)

## 2015-06-18 NOTE — Progress Notes (Signed)
Pre-visit discussion using our clinic review tool. No additional management support is needed unless otherwise documented below in the visit note.  

## 2015-06-18 NOTE — Assessment & Plan Note (Signed)
Physical today 06/18/15.  Declines mammogram.  Declines bone density.  Colonoscopy 12/27/14 as outlined.  Recommended f/u colonoscopy in three years.

## 2015-06-18 NOTE — Progress Notes (Signed)
Patient ID: Ann Perkins, female   DOB: [birth year], 69 y.o.   MRN: [id number]   Subjective:    Patient ID: Ann Perkins, female    DOB: 17-May-1946, 69 y.o.   MRN: [id number]  HPI  Patient with past history of hypertension and hypothyroidism who comes in today to follow up on these issues as well as for a complete physical exam.  She tries to stay active.  No cardiac symptoms with increased activity or exertion.  No sob.  No acid reflux reported.  No abdominal pain or cramping.  No urine change.  No bowel change.  Blood pressures doing well  - averaging 110-130/60-70s.  Continues to decline mammogram.     Past Medical History  Diagnosis Date  . Hemorrhoids   . Allergy   . GERD (gastroesophageal reflux disease)   . History of colon polyps   . Hypertension   . Thyroid disease    Past Surgical History  Procedure Laterality Date  . Tonsillectomy    . Mouth surgery      x 2  . Colonoscopy     Family History  Problem Relation Age of Onset  . Colon cancer Brother     Half(Father Side)  . Colon cancer Sister     x 2 Half(Father Side)  . Colon cancer Cousin     Mother Side   . Hyperlipidemia Mother   . Heart disease Mother   . Diabetes Mother   . Stroke Father   . Esophageal cancer Neg Hx   . Rectal cancer Neg Hx   . Stomach cancer Neg Hx    Social History   Social History  . Marital Status: Single    Spouse Name: N/A  . Number of Children: N/A  . Years of Education: N/A   Occupational History  . Retired    Social History Main Topics  . Smoking status: Never Smoker   . Smokeless tobacco: Never Used  . Alcohol Use: No  . Drug Use: No  . Sexual Activity: Not on file   Other Topics Concern  . Not on file   Social History Narrative    Outpatient Encounter Prescriptions as of 06/18/2015  Medication Sig  . lisinopril (PRINIVIL,ZESTRIL) 5 MG tablet TAKE THREE TABLETS BY MOUTH EVERY DAY  . sodium chloride (OCEAN) 0.65 % nasal spray Place 1 spray into the nose as  needed for congestion.  Marland Kitchen SYNTHROID 75 MCG tablet TAKE ONE TABLET BY MOUTH EVERY DAY   No facility-administered encounter medications on file as of 06/18/2015.    Review of Systems  Constitutional: Negative for appetite change and unexpected weight change.  HENT: Negative for congestion and sinus pressure.   Eyes: Negative for pain and visual disturbance.  Respiratory: Negative for cough, chest tightness and shortness of breath.   Cardiovascular: Negative for chest pain, palpitations and leg swelling.  Gastrointestinal: Negative for nausea, vomiting, abdominal pain and diarrhea.  Genitourinary: Negative for dysuria and difficulty urinating.  Musculoskeletal: Negative for back pain and joint swelling.  Skin: Negative for color change and rash.  Neurological: Negative for dizziness, light-headedness and headaches.  Hematological: Negative for adenopathy. Does not bruise/bleed easily.  Psychiatric/Behavioral: Negative for dysphoric mood and agitation.       Objective:     Blood pressure rechecked by me:  138/82  Physical Exam  Constitutional: She is oriented to person, place, and time. She appears well-developed and well-nourished. No distress.  HENT:  Nose: Nose normal.  Mouth/Throat: Oropharynx is clear  and moist.  Eyes: Right eye exhibits no discharge. Left eye exhibits no discharge. No scleral icterus.  Neck: Neck supple. No thyromegaly present.  Cardiovascular: Normal rate and regular rhythm.   Pulmonary/Chest: Breath sounds normal. No accessory muscle usage. No tachypnea. No respiratory distress. She has no decreased breath sounds. She has no wheezes. She has no rhonchi. Right breast exhibits no inverted nipple, no mass, no nipple discharge and no tenderness (no axillary adenopathy). Left breast exhibits no inverted nipple, no mass, no nipple discharge and no tenderness (no axilarry adenopathy).  Abdominal: Soft. Bowel sounds are normal. There is no tenderness.    Musculoskeletal: She exhibits no edema or tenderness.  Lymphadenopathy:    She has no cervical adenopathy.  Neurological: She is alert and oriented to person, place, and time.  Skin: Skin is warm. No rash noted. No erythema.  Psychiatric: She has a normal mood and affect. Her behavior is normal.    BP 130/86 mmHg  Pulse 83  Temp(Src) 98.2 F (36.8 C) (Oral)  Resp 18  Ht 4' 10.5" (1.486 m)  Wt 132 lb (59.875 kg)  BMI 27.11 kg/m2  SpO2 96%  LMP 07/21/1991 Wt Readings from Last 3 Encounters:  06/18/15 132 lb (59.875 kg)  02/18/15 135 lb 6 oz (61.406 kg)  12/27/14 135 lb (61.236 kg)     Lab Results  Component Value Date   WBC 5.7 02/18/2015   HGB 12.6 02/18/2015   HCT 37.3 02/18/2015   PLT 199.0 02/18/2015   GLUCOSE 97 02/18/2015   CHOL 166 02/18/2015   TRIG 172.0* 02/18/2015   HDL 36.40* 02/18/2015   LDLCALC 96 02/18/2015   ALT 16 02/18/2015   AST 22 02/18/2015   NA 138 02/18/2015   K 4.1 02/18/2015   CL 106 02/18/2015   CREATININE 1.09 02/18/2015   BUN 14 02/18/2015   CO2 26 02/18/2015   TSH 0.99 10/10/2014       Assessment & Plan:   Problem List Items Addressed This Visit    None       Einar Pheasant, MD

## 2015-06-19 ENCOUNTER — Encounter: Payer: Self-pay | Admitting: *Deleted

## 2015-06-19 LAB — HEPATITIS C ANTIBODY: HCV Ab: NEGATIVE

## 2015-06-23 ENCOUNTER — Encounter: Payer: Self-pay | Admitting: Internal Medicine

## 2015-06-23 NOTE — Assessment & Plan Note (Signed)
Colonoscopy 6/016 as outlined.  Recommended f/u colonoscopy in three years.

## 2015-06-23 NOTE — Assessment & Plan Note (Signed)
Blood pressure under good control.  Continue same medication regimen.  Follow pressures.  Follow metabolic panel.   

## 2015-06-23 NOTE — Assessment & Plan Note (Signed)
Low cholesterol diet and exercise.  Follow lipid panel.   

## 2015-06-23 NOTE — Assessment & Plan Note (Signed)
On synthroid.  Follow tsh.   

## 2015-07-09 ENCOUNTER — Other Ambulatory Visit: Payer: Self-pay | Admitting: Internal Medicine

## 2015-07-16 ENCOUNTER — Other Ambulatory Visit: Payer: Self-pay | Admitting: Internal Medicine

## 2015-10-04 ENCOUNTER — Other Ambulatory Visit: Payer: Self-pay | Admitting: Internal Medicine

## 2015-10-15 DIAGNOSIS — R69 Illness, unspecified: Secondary | ICD-10-CM | POA: Diagnosis not present

## 2015-10-17 ENCOUNTER — Ambulatory Visit: Payer: Medicare Other | Admitting: Internal Medicine

## 2015-11-14 ENCOUNTER — Ambulatory Visit (INDEPENDENT_AMBULATORY_CARE_PROVIDER_SITE_OTHER): Payer: Medicare HMO | Admitting: Internal Medicine

## 2015-11-14 ENCOUNTER — Encounter: Payer: Self-pay | Admitting: Internal Medicine

## 2015-11-14 VITALS — BP 138/72 | HR 95 | Temp 99.0°F | Resp 12 | Ht 58.5 in | Wt 140.8 lb

## 2015-11-14 DIAGNOSIS — E7800 Pure hypercholesterolemia, unspecified: Secondary | ICD-10-CM | POA: Diagnosis not present

## 2015-11-14 DIAGNOSIS — I10 Essential (primary) hypertension: Secondary | ICD-10-CM | POA: Diagnosis not present

## 2015-11-14 DIAGNOSIS — E039 Hypothyroidism, unspecified: Secondary | ICD-10-CM

## 2015-11-14 MED ORDER — CARBAMIDE PEROXIDE 6.5 % OT SOLN: 5.0000 [drp] | Freq: Every day | OTIC | Status: DC

## 2015-11-14 MED ORDER — MUPIROCIN CALCIUM 2 % EX CREA: 1.0000 "application " | TOPICAL_CREAM | Freq: Two times a day (BID) | CUTANEOUS | Status: DC

## 2015-11-14 NOTE — Progress Notes (Signed)
Pre visit review using our clinic review tool, if applicable. No additional management support is needed unless otherwise documented below in the visit note. 

## 2015-11-14 NOTE — Progress Notes (Signed)
Patient ID: Ann Perkins, female   DOB: Mar 31, 1946, 70 y.o.   MRN: [id number]   Subjective:    Patient ID: Ann Perkins, female    DOB: 17-Oct-1945, 70 y.o.   MRN: [id number]  HPI  Patient here for a scheduled follow up.   She states she is doing well.  Feels good.  Tries to stay active.  No cardiac symptoms with increased activity or exertion.  No sob.  No acid reflux.  No abdominal pain or cramping.  Bowels stable.  Blood pressures reviewed.  She is not sure if machine is accurate.  Some low blood pressures.  No dizziness or light headedness.    Past Medical History  Diagnosis Date  . Hemorrhoids   . Allergy   . GERD (gastroesophageal reflux disease)   . History of colon polyps   . Hypertension   . Thyroid disease    Past Surgical History  Procedure Laterality Date  . Tonsillectomy    . Mouth surgery      x 2  . Colonoscopy     Family History  Problem Relation Age of Onset  . Colon cancer Brother     Half(Father Side)  . Colon cancer Sister     x 2 Half(Father Side)  . Colon cancer Cousin     Mother Side   . Hyperlipidemia Mother   . Heart disease Mother   . Diabetes Mother   . Stroke Father   . Esophageal cancer Neg Hx   . Rectal cancer Neg Hx   . Stomach cancer Neg Hx    Social History   Social History  . Marital Status: Single    Spouse Name: N/A  . Number of Children: N/A  . Years of Education: N/A   Occupational History  . Retired    Social History Main Topics  . Smoking status: Never Smoker   . Smokeless tobacco: Never Used  . Alcohol Use: No  . Drug Use: No  . Sexual Activity: Not Asked   Other Topics Concern  . None   Social History Narrative    Outpatient Encounter Prescriptions as of 11/14/2015  Medication Sig  . lisinopril (PRINIVIL,ZESTRIL) 5 MG tablet TAKE THREE TABLETS BY MOUTH EVERY DAY  . sodium chloride (OCEAN) 0.65 % nasal spray Place 1 spray into the nose as needed for congestion.  Marland Kitchen SYNTHROID 75 MCG tablet TAKE ONE TABLET BY  MOUTH EVERY DAY  . carbamide peroxide (DEBROX) 6.5 % otic solution Place 5 drops into the right ear daily.  . mupirocin cream (BACTROBAN) 2 % Apply 1 application topically 2 (two) times daily.   No facility-administered encounter medications on file as of 11/14/2015.    Review of Systems  Constitutional: Negative for appetite change and unexpected weight change.  HENT: Negative for congestion and sinus pressure.   Respiratory: Negative for cough, chest tightness and shortness of breath.   Cardiovascular: Negative for chest pain, palpitations and leg swelling.  Gastrointestinal: Negative for nausea, vomiting, abdominal pain and diarrhea.  Genitourinary: Negative for dysuria and difficulty urinating.  Musculoskeletal: Negative for back pain and joint swelling.  Skin: Negative for color change and rash.  Neurological: Negative for dizziness, light-headedness and headaches.  Psychiatric/Behavioral: Negative for dysphoric mood and agitation.       Objective:    Physical Exam  Constitutional: She appears well-developed and well-nourished. No distress.  HENT:  Nose: Nose normal.  Mouth/Throat: Oropharynx is clear and moist.  Neck: Neck supple. No thyromegaly present.  Cardiovascular:  Normal rate and regular rhythm.   Pulmonary/Chest: Breath sounds normal. No respiratory distress. She has no wheezes.  Abdominal: Soft. Bowel sounds are normal. There is no tenderness.  Musculoskeletal: She exhibits no edema or tenderness.  Lymphadenopathy:    She has no cervical adenopathy.  Skin: No rash noted. No erythema.  Psychiatric: She has a normal mood and affect. Her behavior is normal.    BP 138/72 mmHg  Pulse 95  Temp(Src) 99 F (37.2 C) (Oral)  Resp 12  Ht 4' 10.5" (1.486 m)  Wt 140 lb 12.8 oz (63.866 kg)  BMI 28.92 kg/m2  SpO2 96%  LMP 07/21/1991 Wt Readings from Last 3 Encounters:  11/14/15 140 lb 12.8 oz (63.866 kg)  06/18/15 132 lb (59.875 kg)  02/18/15 135 lb 6 oz (61.406 kg)      Lab Results  Component Value Date   WBC 5.7 02/18/2015   HGB 12.6 02/18/2015   HCT 37.3 02/18/2015   PLT 199.0 02/18/2015   GLUCOSE 95 06/18/2015   CHOL 186 06/18/2015   TRIG 115.0 06/18/2015   HDL 41.70 06/18/2015   LDLCALC 122* 06/18/2015   ALT 15 06/18/2015   AST 18 06/18/2015   NA 140 06/18/2015   K 4.5 06/18/2015   CL 105 06/18/2015   CREATININE 1.09 06/18/2015   BUN 15 06/18/2015   CO2 29 06/18/2015   TSH 1.62 06/18/2015       Assessment & Plan:   Problem List Items Addressed This Visit    Essential hypertension, benign - Primary    Blood pressure doing well.  Same medication regimen.  Follow pressures.  Follow metabolic panel.        Relevant Orders   CBC with Differential/Platelet   Basic metabolic panel   Hypercholesterolemia    Low cholesterol diet and exercise.  Follow lipid panel and liver function tests.        Relevant Orders   Lipid panel   Hepatic function panel   Hypothyroidism    On thyroid replacement.  Follow tsh.        Relevant Orders   TSH       Einar Pheasant, MD

## 2015-11-17 ENCOUNTER — Encounter: Payer: Self-pay | Admitting: Internal Medicine

## 2015-11-17 NOTE — Assessment & Plan Note (Signed)
Low cholesterol diet and exercise.  Follow lipid panel and liver function tests.  

## 2015-11-17 NOTE — Assessment & Plan Note (Signed)
On thyroid replacement.  Follow tsh.  

## 2015-11-17 NOTE — Assessment & Plan Note (Signed)
Blood pressure doing well.  Same medication regimen.  Follow pressures.  Follow metabolic panel.   

## 2015-11-26 ENCOUNTER — Ambulatory Visit (INDEPENDENT_AMBULATORY_CARE_PROVIDER_SITE_OTHER): Payer: Medicare HMO

## 2015-11-26 ENCOUNTER — Other Ambulatory Visit (INDEPENDENT_AMBULATORY_CARE_PROVIDER_SITE_OTHER): Payer: Medicare HMO

## 2015-11-26 DIAGNOSIS — I10 Essential (primary) hypertension: Secondary | ICD-10-CM

## 2015-11-26 DIAGNOSIS — H6121 Impacted cerumen, right ear: Secondary | ICD-10-CM | POA: Diagnosis not present

## 2015-11-26 DIAGNOSIS — E039 Hypothyroidism, unspecified: Secondary | ICD-10-CM | POA: Diagnosis not present

## 2015-11-26 DIAGNOSIS — E7800 Pure hypercholesterolemia, unspecified: Secondary | ICD-10-CM

## 2015-11-26 LAB — HEPATIC FUNCTION PANEL
ALK PHOS: 62 U/L (ref 39–117)
ALT: 15 U/L (ref 0–35)
AST: 22 U/L (ref 0–37)
Albumin: 4.4 g/dL (ref 3.5–5.2)
BILIRUBIN DIRECT: 0.1 mg/dL (ref 0.0–0.3)
TOTAL PROTEIN: 7.6 g/dL (ref 6.0–8.3)
Total Bilirubin: 0.9 mg/dL (ref 0.2–1.2)

## 2015-11-26 LAB — CBC WITH DIFFERENTIAL/PLATELET
BASOS ABS: 0 10*3/uL (ref 0.0–0.1)
Basophils Relative: 0.3 % (ref 0.0–3.0)
Eosinophils Absolute: 0.1 10*3/uL (ref 0.0–0.7)
Eosinophils Relative: 2 % (ref 0.0–5.0)
HCT: 40 % (ref 36.0–46.0)
Hemoglobin: 13.7 g/dL (ref 12.0–15.0)
Lymphocytes Relative: 27 % (ref 12.0–46.0)
Lymphs Abs: 1.4 10*3/uL (ref 0.7–4.0)
MCHC: 34.2 g/dL (ref 30.0–36.0)
MCV: 90.2 fl (ref 78.0–100.0)
MONOS PCT: 7.9 % (ref 3.0–12.0)
Monocytes Absolute: 0.4 10*3/uL (ref 0.1–1.0)
NEUTROS ABS: 3.4 10*3/uL (ref 1.4–7.7)
NEUTROS PCT: 62.8 % (ref 43.0–77.0)
PLATELETS: 218 10*3/uL (ref 150.0–400.0)
RBC: 4.44 Mil/uL (ref 3.87–5.11)
RDW: 13.6 % (ref 11.5–15.5)
WBC: 5.4 10*3/uL (ref 4.0–10.5)

## 2015-11-26 LAB — LIPID PANEL
CHOLESTEROL: 187 mg/dL (ref 0–200)
HDL: 43.5 mg/dL (ref >39.00)
LDL Cholesterol: 123 mg/dL — ABNORMAL HIGH (ref 0–99)
NonHDL: 143.8
TRIGLYCERIDES: 105 mg/dL (ref 0.0–149.0)
Total CHOL/HDL Ratio: 4
VLDL: 21 mg/dL (ref 0.0–40.0)

## 2015-11-26 LAB — BASIC METABOLIC PANEL
BUN: 19 mg/dL (ref 6–23)
CALCIUM: 9.8 mg/dL (ref 8.4–10.5)
CO2: 27 mEq/L (ref 19–32)
Chloride: 104 mEq/L (ref 96–112)
Creatinine, Ser: 1.17 mg/dL (ref 0.40–1.20)
GFR: 48.85 mL/min — AB (ref >60.00)
Glucose, Bld: 100 mg/dL — ABNORMAL HIGH (ref 70–99)
Potassium: 5.1 mEq/L (ref 3.5–5.1)
Sodium: 139 mEq/L (ref 135–145)

## 2015-11-26 LAB — TSH: TSH: 2.32 u[IU]/mL (ref 0.35–4.50)

## 2015-11-26 NOTE — Progress Notes (Signed)
Patient came in for a ear irrigation for the right ear.  Per the patient she has been using the debrox for 4 days to the right ear.  Inspected right ear, noted a large clump of orange colored wax in the canal.  Irrigated with warm water and peroxide.  Removed wax that was noted.  Inspected ear again and clear to the ear drum.    Please advise in Dr. Bary Leriche absence. thanks

## 2015-11-28 ENCOUNTER — Encounter: Payer: Self-pay | Admitting: *Deleted

## 2015-11-28 NOTE — Progress Notes (Signed)
Patient ID: Ann Perkins, female   DOB: 12-30-1945, 70 y.o.   MRN: [id number] I have reviewed and agree with the above documentation.  Tommi Rumps, M.D.

## 2016-01-14 ENCOUNTER — Other Ambulatory Visit: Payer: Self-pay | Admitting: Internal Medicine

## 2016-03-18 ENCOUNTER — Encounter: Payer: Self-pay | Admitting: Internal Medicine

## 2016-03-18 ENCOUNTER — Ambulatory Visit (INDEPENDENT_AMBULATORY_CARE_PROVIDER_SITE_OTHER): Payer: Medicare HMO | Admitting: Internal Medicine

## 2016-03-18 VITALS — BP 160/75 | HR 76 | Temp 98.0°F | Resp 16 | Wt 132.0 lb

## 2016-03-18 DIAGNOSIS — E7800 Pure hypercholesterolemia, unspecified: Secondary | ICD-10-CM | POA: Diagnosis not present

## 2016-03-18 DIAGNOSIS — H61899 Other specified disorders of external ear, unspecified ear: Secondary | ICD-10-CM

## 2016-03-18 DIAGNOSIS — I10 Essential (primary) hypertension: Secondary | ICD-10-CM

## 2016-03-18 DIAGNOSIS — E039 Hypothyroidism, unspecified: Secondary | ICD-10-CM | POA: Diagnosis not present

## 2016-03-18 DIAGNOSIS — Z860100 Personal history of colon polyps, unspecified: Secondary | ICD-10-CM

## 2016-03-18 DIAGNOSIS — J3489 Other specified disorders of nose and nasal sinuses: Secondary | ICD-10-CM

## 2016-03-18 DIAGNOSIS — H9390 Unspecified disorder of ear, unspecified ear: Secondary | ICD-10-CM

## 2016-03-18 LAB — LIPID PANEL
Cholesterol: 188 mg/dL (ref 0–200)
HDL: 39.3 mg/dL (ref >39.00)
LDL Cholesterol: 121 mg/dL — ABNORMAL HIGH (ref 0–99)
NONHDL: 148.84
Total CHOL/HDL Ratio: 5
Triglycerides: 139 mg/dL (ref 0.0–149.0)
VLDL: 27.8 mg/dL (ref 0.0–40.0)

## 2016-03-18 LAB — COMPREHENSIVE METABOLIC PANEL
ALK PHOS: 57 U/L (ref 39–117)
ALT: 15 U/L (ref 0–35)
AST: 22 U/L (ref 0–37)
Albumin: 4.1 g/dL (ref 3.5–5.2)
BUN: 15 mg/dL (ref 6–23)
CO2: 29 mEq/L (ref 19–32)
Calcium: 9.7 mg/dL (ref 8.4–10.5)
Chloride: 106 mEq/L (ref 96–112)
Creatinine, Ser: 1.13 mg/dL (ref 0.40–1.20)
GFR: 50.81 mL/min — AB (ref >60.00)
GLUCOSE: 100 mg/dL — AB (ref 70–99)
POTASSIUM: 5 meq/L (ref 3.5–5.1)
Sodium: 139 mEq/L (ref 135–145)
TOTAL PROTEIN: 7.7 g/dL (ref 6.0–8.3)
Total Bilirubin: 0.6 mg/dL (ref 0.2–1.2)

## 2016-03-18 NOTE — Assessment & Plan Note (Signed)
On thyroid replacement.  Follow tsh.  Last check 11/2015 wnl.

## 2016-03-18 NOTE — Assessment & Plan Note (Signed)
Low cholesterol diet and exercise.  Check lipid panel today.   

## 2016-03-18 NOTE — Assessment & Plan Note (Signed)
Colonoscopy 12/2014.  Recommended f/u colonoscopy in 2019.

## 2016-03-18 NOTE — Assessment & Plan Note (Signed)
Blood pressure doing well on outside checks.  See attached list.  Actually low.  Blood pressure here as outlined.  Hold on additional medication.  She will spot check her pressure on a different machine.  Follow .  Notify me if elevation.

## 2016-03-18 NOTE — Progress Notes (Signed)
Patient ID: Ann Perkins, female   DOB: Sep 07, 1945, 70 y.o.   MRN: [id number]   Subjective:    Patient ID: Ann Perkins, female    DOB: 1946-06-30, 70 y.o.   MRN: [id number]  HPI  Patient here for a scheduled follow up.  States she is doing well.  Feels good.  Stays active.  Has been mowing.  No chest pain.  No sob.  No acid reflux.  No abdominal pain or cramping.  Bowels stable.  Outside blood pressure checks reviewed.  Running on low side.  Elevated here.  Has a nasal lesion and ear lesion.  No known injury.     Past Medical History:  Diagnosis Date  . Allergy   . GERD (gastroesophageal reflux disease)   . Hemorrhoids   . History of colon polyps   . Hypertension   . Thyroid disease    Past Surgical History:  Procedure Laterality Date  . COLONOSCOPY    . MOUTH SURGERY     x 2  . TONSILLECTOMY     Family History  Problem Relation Age of Onset  . Hyperlipidemia Mother   . Heart disease Mother   . Diabetes Mother   . Stroke Father   . Colon cancer Brother     Half(Father Side)  . Colon cancer Sister     x 2 Half(Father Side)  . Colon cancer Cousin     Mother Side   . Esophageal cancer Neg Hx   . Rectal cancer Neg Hx   . Stomach cancer Neg Hx    Social History   Social History  . Marital status: Single    Spouse name: N/A  . Number of children: N/A  . Years of education: N/A   Occupational History  . Retired    Social History Main Topics  . Smoking status: Never Smoker  . Smokeless tobacco: Never Used  . Alcohol use No  . Drug use: No  . Sexual activity: Not Asked   Other Topics Concern  . None   Social History Narrative  . None    Outpatient Encounter Prescriptions as of 03/18/2016  Medication Sig  . lisinopril (PRINIVIL,ZESTRIL) 5 MG tablet TAKE THREE TABLETS BY MOUTH EVERY DAY  . sodium chloride (OCEAN) 0.65 % nasal spray Place 1 spray into the nose as needed for congestion.  Marland Kitchen SYNTHROID 75 MCG tablet TAKE ONE TABLET EVERY DAY  . [DISCONTINUED]  carbamide peroxide (DEBROX) 6.5 % otic solution Place 5 drops into the right ear daily.  . [DISCONTINUED] mupirocin cream (BACTROBAN) 2 % Apply 1 application topically 2 (two) times daily.   No facility-administered encounter medications on file as of 03/18/2016.     Review of Systems  Constitutional: Negative for appetite change and unexpected weight change.  HENT: Negative for congestion and sinus pressure.   Respiratory: Negative for cough, chest tightness and shortness of breath.   Cardiovascular: Negative for chest pain, palpitations and leg swelling.  Gastrointestinal: Negative for abdominal pain and diarrhea.  Genitourinary: Negative for difficulty urinating and dysuria.  Musculoskeletal: Negative for back pain and joint swelling.  Skin:       Right nares - lesion.  Left ear lesion.  No increased erythema.    Neurological: Negative for dizziness, light-headedness and headaches.  Psychiatric/Behavioral: Negative for agitation and dysphoric mood.       Objective:     Blood pressure rechecked by me:  142/80  Physical Exam  Constitutional: She appears well-developed and well-nourished. No distress.  HENT:  Nose: Nose normal.  Mouth/Throat: Oropharynx is clear and moist.  Right nares - lesion.   Neck: Neck supple. No thyromegaly present.  Cardiovascular: Normal rate and regular rhythm.   Pulmonary/Chest: Breath sounds normal. No respiratory distress. She has no wheezes.  Abdominal: Soft. Bowel sounds are normal. There is no tenderness.  Musculoskeletal: She exhibits no edema or tenderness.  Lymphadenopathy:    She has no cervical adenopathy.  Skin: No rash noted. No erythema.  Left ear lesion.  No erythema.    Psychiatric: She has a normal mood and affect. Her behavior is normal.    BP (!) 160/75 (BP Location: Left Arm, Patient Position: Sitting, Cuff Size: Normal)   Pulse 76   Temp 98 F (36.7 C) (Oral)   Resp 16   Wt 132 lb (59.9 kg)   LMP 07/21/1991   BMI 27.12  kg/m  Wt Readings from Last 3 Encounters:  03/18/16 132 lb (59.9 kg)  11/14/15 140 lb 12.8 oz (63.9 kg)  06/18/15 132 lb (59.9 kg)     Lab Results  Component Value Date   WBC 5.4 11/26/2015   HGB 13.7 11/26/2015   HCT 40.0 11/26/2015   PLT 218.0 11/26/2015   GLUCOSE 100 (H) 11/26/2015   CHOL 187 11/26/2015   TRIG 105.0 11/26/2015   HDL 43.50 11/26/2015   LDLCALC 123 (H) 11/26/2015   ALT 15 11/26/2015   AST 22 11/26/2015   NA 139 11/26/2015   K 5.1 11/26/2015   CL 104 11/26/2015   CREATININE 1.17 11/26/2015   BUN 19 11/26/2015   CO2 27 11/26/2015   TSH 2.32 11/26/2015       Assessment & Plan:   Problem List Items Addressed This Visit    Essential hypertension, benign    Blood pressure doing well on outside checks.  See attached list.  Actually low.  Blood pressure here as outlined.  Hold on additional medication.  She will spot check her pressure on a different machine.  Follow .  Notify me if elevation.       History of colonic polyps    Colonoscopy 12/2014.  Recommended f/u colonoscopy in 2019.       Hypercholesterolemia    Low cholesterol diet and exercise.  Check lipid panel today.       Relevant Orders   Lipid panel   Comprehensive metabolic panel   Hypothyroidism    On thyroid replacement.  Follow tsh.  Last check 11/2015 wnl.        Other Visit Diagnoses    Nasal lesion    -  Primary   bactroban as directed.  notify me if persistent.    Ear lesion       lesion of lear - external.  bactroban as directed.  follow.         Einar Pheasant, MD

## 2016-03-18 NOTE — Addendum Note (Signed)
Addended by: Leeanne Rio on: 03/18/2016 09:36 AM   Modules accepted: Orders

## 2016-03-19 ENCOUNTER — Telehealth: Payer: Self-pay

## 2016-03-19 NOTE — Telephone Encounter (Signed)
-----   Message from Bevelyn Ngo, RN sent at 03/19/2016  9:50 AM EDT -----   ----- Message ----- From: Einar Pheasant, MD Sent: 03/19/2016   4:29 AM To: Leeanne Rio, CMA  Notify pt that her cholesterol levels are relatively stable from last check.  Slightly increased from checks prior.  Low cholesterol diet and exercise.  We will follow.  Kidney function tests stable.  Liver function tests wnl.

## 2016-03-19 NOTE — Telephone Encounter (Signed)
Advised pt of lab results. Pt verbally acknowledges understanding. Nyxon Strupp Drozdowski, CMA   

## 2016-04-03 ENCOUNTER — Other Ambulatory Visit: Payer: Self-pay | Admitting: Internal Medicine

## 2016-04-10 ENCOUNTER — Other Ambulatory Visit: Payer: Self-pay | Admitting: Internal Medicine

## 2016-04-16 DIAGNOSIS — J301 Allergic rhinitis due to pollen: Secondary | ICD-10-CM | POA: Diagnosis not present

## 2016-04-16 DIAGNOSIS — J340 Abscess, furuncle and carbuncle of nose: Secondary | ICD-10-CM | POA: Diagnosis not present

## 2016-04-21 DIAGNOSIS — H2513 Age-related nuclear cataract, bilateral: Secondary | ICD-10-CM | POA: Diagnosis not present

## 2016-04-21 DIAGNOSIS — H5213 Myopia, bilateral: Secondary | ICD-10-CM | POA: Diagnosis not present

## 2016-04-21 DIAGNOSIS — H524 Presbyopia: Secondary | ICD-10-CM | POA: Diagnosis not present

## 2016-04-24 ENCOUNTER — Encounter: Payer: Self-pay | Admitting: Internal Medicine

## 2016-04-24 LAB — HM DIABETES EYE EXAM

## 2016-05-01 ENCOUNTER — Other Ambulatory Visit: Payer: Self-pay | Admitting: Internal Medicine

## 2016-05-12 ENCOUNTER — Ambulatory Visit (INDEPENDENT_AMBULATORY_CARE_PROVIDER_SITE_OTHER): Payer: Medicare HMO

## 2016-05-12 DIAGNOSIS — Z23 Encounter for immunization: Secondary | ICD-10-CM

## 2016-05-14 DIAGNOSIS — R69 Illness, unspecified: Secondary | ICD-10-CM | POA: Diagnosis not present

## 2016-07-07 ENCOUNTER — Encounter: Payer: Self-pay | Admitting: Internal Medicine

## 2016-07-07 ENCOUNTER — Ambulatory Visit (INDEPENDENT_AMBULATORY_CARE_PROVIDER_SITE_OTHER): Payer: Medicare HMO | Admitting: Internal Medicine

## 2016-07-07 VITALS — BP 138/76 | HR 98 | Temp 97.7°F | Ht 59.0 in | Wt 132.6 lb

## 2016-07-07 DIAGNOSIS — E7800 Pure hypercholesterolemia, unspecified: Secondary | ICD-10-CM | POA: Diagnosis not present

## 2016-07-07 DIAGNOSIS — Z86010 Personal history of colonic polyps: Secondary | ICD-10-CM

## 2016-07-07 DIAGNOSIS — Z0000 Encounter for general adult medical examination without abnormal findings: Secondary | ICD-10-CM | POA: Diagnosis not present

## 2016-07-07 DIAGNOSIS — E039 Hypothyroidism, unspecified: Secondary | ICD-10-CM | POA: Diagnosis not present

## 2016-07-07 DIAGNOSIS — I10 Essential (primary) hypertension: Secondary | ICD-10-CM

## 2016-07-07 LAB — TSH: TSH: 1.37 u[IU]/mL (ref 0.35–4.50)

## 2016-07-07 LAB — LIPID PANEL
CHOLESTEROL: 187 mg/dL (ref 0–200)
HDL: 45.1 mg/dL (ref >39.00)
LDL CALC: 119 mg/dL — AB (ref 0–99)
NONHDL: 141.79
Total CHOL/HDL Ratio: 4
Triglycerides: 112 mg/dL (ref 0.0–149.0)
VLDL: 22.4 mg/dL (ref 0.0–40.0)

## 2016-07-07 LAB — COMPREHENSIVE METABOLIC PANEL
ALBUMIN: 4.4 g/dL (ref 3.5–5.2)
ALK PHOS: 61 U/L (ref 39–117)
ALT: 16 U/L (ref 0–35)
AST: 21 U/L (ref 0–37)
BUN: 21 mg/dL (ref 6–23)
CHLORIDE: 106 meq/L (ref 96–112)
CO2: 28 mEq/L (ref 19–32)
Calcium: 9.9 mg/dL (ref 8.4–10.5)
Creatinine, Ser: 1.15 mg/dL (ref 0.40–1.20)
GFR: 49.74 mL/min — AB (ref >60.00)
Glucose, Bld: 109 mg/dL — ABNORMAL HIGH (ref 70–99)
POTASSIUM: 5.4 meq/L — AB (ref 3.5–5.1)
Sodium: 140 mEq/L (ref 135–145)
TOTAL PROTEIN: 7.4 g/dL (ref 6.0–8.3)
Total Bilirubin: 0.4 mg/dL (ref 0.2–1.2)

## 2016-07-07 NOTE — Assessment & Plan Note (Signed)
Blood pressure under good control.  Continue same medication regimen.  Follow pressures.  Follow metabolic panel.   

## 2016-07-07 NOTE — Assessment & Plan Note (Signed)
Physical today 07/07/16.  Declines mammogram.  Colonoscopy 12/2014.  Recommended f/u 12/2017.

## 2016-07-07 NOTE — Assessment & Plan Note (Signed)
Low cholesterol diet and exercise.  Discussed diet and exercise today.  Follow lipid panel.

## 2016-07-07 NOTE — Assessment & Plan Note (Signed)
Colonoscopy 12/2014 as outlined.  Recommended f/u in 12/2017.

## 2016-07-07 NOTE — Progress Notes (Signed)
Patient ID: Ann Perkins, female   DOB: 12-Aug-1945, 70 y.o.   MRN: [id number]   Subjective:    Patient ID: Ann Perkins, female    DOB: Aug 12, 1945, 70 y.o.   MRN: [id number]  HPI  Patient here for her physical exam.  States she is doing well.  Feels good.  No chest pain.  No sob.  No acid reflux. No abdominal pain or cramping.  Bowels stable.  She brings in blood pressure readings.  Blood pressure on outside checks averaging 100-130/60-70s.  No syncope or near syncope.  No significant light headedness or dizziness.     Past Medical History:  Diagnosis Date  . Allergy   . GERD (gastroesophageal reflux disease)   . Hemorrhoids   . History of colon polyps   . Hypertension   . Thyroid disease    Past Surgical History:  Procedure Laterality Date  . COLONOSCOPY    . MOUTH SURGERY     x 2  . TONSILLECTOMY     Family History  Problem Relation Age of Onset  . Hyperlipidemia Mother   . Heart disease Mother   . Diabetes Mother   . Stroke Father   . Colon cancer Brother     Half(Father Side)  . Colon cancer Sister     x 2 Half(Father Side)  . Colon cancer Cousin     Mother Side   . Esophageal cancer Neg Hx   . Rectal cancer Neg Hx   . Stomach cancer Neg Hx    Social History   Social History  . Marital status: Single    Spouse name: N/A  . Number of children: N/A  . Years of education: N/A   Occupational History  . Retired    Social History Main Topics  . Smoking status: Never Smoker  . Smokeless tobacco: Never Used  . Alcohol use No  . Drug use: No  . Sexual activity: Not Asked   Other Topics Concern  . None   Social History Narrative  . None    Outpatient Encounter Prescriptions as of 07/07/2016  Medication Sig  . lisinopril (PRINIVIL,ZESTRIL) 5 MG tablet TAKE THREE TABLETS EVERY DAY  . sodium chloride (OCEAN) 0.65 % nasal spray Place 1 spray into the nose as needed for congestion.  Marland Kitchen SYNTHROID 75 MCG tablet TAKE 1 TABLET BY MOUTH DAILY  . [DISCONTINUED]  lisinopril (PRINIVIL,ZESTRIL) 5 MG tablet TAKE THREE TABLETS EVERY DAY   No facility-administered encounter medications on file as of 07/07/2016.     Review of Systems  Constitutional: Negative for appetite change and unexpected weight change.  HENT: Negative for congestion and sinus pressure.   Eyes: Negative for pain and visual disturbance.  Respiratory: Negative for cough, chest tightness and shortness of breath.   Cardiovascular: Negative for chest pain, palpitations and leg swelling.  Gastrointestinal: Negative for abdominal pain, diarrhea, nausea and vomiting.  Genitourinary: Negative for difficulty urinating and dysuria.  Musculoskeletal: Negative for back pain and joint swelling.  Skin: Negative for color change and rash.  Neurological: Negative for dizziness, light-headedness and headaches.  Hematological: Negative for adenopathy. Does not bruise/bleed easily.  Psychiatric/Behavioral: Negative for agitation and dysphoric mood.       Objective:     Blood pressure rechecked by me:  134/78  Physical Exam  Constitutional: She is oriented to person, place, and time. She appears well-developed and well-nourished. No distress.  HENT:  Nose: Nose normal.  Mouth/Throat: Oropharynx is clear and moist.  Eyes: Right eye  exhibits no discharge. Left eye exhibits no discharge. No scleral icterus.  Neck: Neck supple. No thyromegaly present.  Cardiovascular: Normal rate and regular rhythm.   Pulmonary/Chest: Breath sounds normal. No accessory muscle usage. No tachypnea. No respiratory distress. She has no decreased breath sounds. She has no wheezes. She has no rhonchi. Right breast exhibits no inverted nipple, no mass, no nipple discharge and no tenderness (no axillary adenopathy). Left breast exhibits no inverted nipple, no mass, no nipple discharge and no tenderness (no axilarry adenopathy).  Abdominal: Soft. Bowel sounds are normal. There is no tenderness.  Musculoskeletal: She exhibits  no edema or tenderness.  Lymphadenopathy:    She has no cervical adenopathy.  Neurological: She is alert and oriented to person, place, and time.  Skin: Skin is warm. No rash noted. No erythema.  Psychiatric: She has a normal mood and affect. Her behavior is normal.    BP 138/76   Pulse 98   Temp 97.7 F (36.5 C) (Oral)   Ht 4\' 11"  (1.499 m)   Wt 132 lb 9.6 oz (60.1 kg)   LMP 07/21/1991   SpO2 95%   BMI 26.78 kg/m  Wt Readings from Last 3 Encounters:  07/07/16 132 lb 9.6 oz (60.1 kg)  03/18/16 132 lb (59.9 kg)  11/14/15 140 lb 12.8 oz (63.9 kg)     Lab Results  Component Value Date   WBC 5.4 11/26/2015   HGB 13.7 11/26/2015   HCT 40.0 11/26/2015   PLT 218.0 11/26/2015   GLUCOSE 100 (H) 03/18/2016   CHOL 188 03/18/2016   TRIG 139.0 03/18/2016   HDL 39.30 03/18/2016   LDLCALC 121 (H) 03/18/2016   ALT 15 03/18/2016   AST 22 03/18/2016   NA 139 03/18/2016   K 5.0 03/18/2016   CL 106 03/18/2016   CREATININE 1.13 03/18/2016   BUN 15 03/18/2016   CO2 29 03/18/2016   TSH 2.32 11/26/2015       Assessment & Plan:   Problem List Items Addressed This Visit    Essential hypertension, benign    Blood pressure under good control.  Continue same medication regimen.  Follow pressures.  Follow metabolic panel.        Health care maintenance    Physical today 07/07/16.  Declines mammogram.  Colonoscopy 12/2014.  Recommended f/u 12/2017.        History of colonic polyps    Colonoscopy 12/2014 as outlined.  Recommended f/u in 12/2017.        Hypercholesterolemia    Low cholesterol diet and exercise.  Discussed diet and exercise today.  Follow lipid panel.        Relevant Orders   Lipid panel   Comprehensive metabolic panel   Hypothyroidism    On thyroid replacement.  Follow tsh.       Relevant Orders   TSH       Einar Pheasant, MD

## 2016-07-07 NOTE — Assessment & Plan Note (Signed)
On thyroid replacement.  Follow tsh.  

## 2016-07-08 ENCOUNTER — Other Ambulatory Visit: Payer: Self-pay | Admitting: Internal Medicine

## 2016-07-08 ENCOUNTER — Telehealth: Payer: Self-pay | Admitting: *Deleted

## 2016-07-08 DIAGNOSIS — E875 Hyperkalemia: Secondary | ICD-10-CM

## 2016-07-08 NOTE — Progress Notes (Signed)
Order placed for f/u potassium.  

## 2016-07-08 NOTE — Telephone Encounter (Signed)
Patient requested a call in reference to eating food that possibly raised her potassium  Pt contact [phone]

## 2016-07-08 NOTE — Telephone Encounter (Signed)
I have placed a sheet on your desk with the foods that are high in potassium  Potatoes are higher in potassium.  Please send to her.  Thanks

## 2016-07-08 NOTE — Telephone Encounter (Signed)
Spoke with patient and she stated that she has been eating a lot of almonds and potato chips. Wanted to know if these could raise her potassium?

## 2016-07-09 ENCOUNTER — Other Ambulatory Visit (INDEPENDENT_AMBULATORY_CARE_PROVIDER_SITE_OTHER): Payer: Medicare HMO

## 2016-07-09 DIAGNOSIS — E875 Hyperkalemia: Secondary | ICD-10-CM | POA: Diagnosis not present

## 2016-07-09 LAB — POTASSIUM: POTASSIUM: 5.2 meq/L — AB (ref 3.5–5.1)

## 2016-07-09 NOTE — Telephone Encounter (Signed)
Notified patient and put envelope in lab for patient.

## 2016-07-10 ENCOUNTER — Other Ambulatory Visit: Payer: Self-pay | Admitting: Internal Medicine

## 2016-07-10 DIAGNOSIS — E875 Hyperkalemia: Secondary | ICD-10-CM

## 2016-07-10 NOTE — Progress Notes (Signed)
F/u potassium lab ordered.

## 2016-08-08 ENCOUNTER — Other Ambulatory Visit: Payer: Self-pay | Admitting: Internal Medicine

## 2016-09-29 DIAGNOSIS — H43812 Vitreous degeneration, left eye: Secondary | ICD-10-CM | POA: Diagnosis not present

## 2016-11-17 DIAGNOSIS — R69 Illness, unspecified: Secondary | ICD-10-CM | POA: Diagnosis not present

## 2016-11-26 ENCOUNTER — Other Ambulatory Visit: Payer: Self-pay

## 2016-11-26 MED ORDER — SYNTHROID 75 MCG PO TABS: 75.0000 ug | ORAL_TABLET | Freq: Every day | ORAL | 0 refills | Status: DC

## 2016-11-26 MED ORDER — LISINOPRIL 5 MG PO TABS: ORAL_TABLET | ORAL | 0 refills | Status: DC

## 2017-01-11 ENCOUNTER — Ambulatory Visit (INDEPENDENT_AMBULATORY_CARE_PROVIDER_SITE_OTHER): Payer: Medicare HMO | Admitting: Internal Medicine

## 2017-01-11 ENCOUNTER — Encounter: Payer: Self-pay | Admitting: Internal Medicine

## 2017-01-11 VITALS — BP 130/82 | HR 86 | Temp 97.6°F | Resp 12 | Ht 59.0 in | Wt 128.0 lb

## 2017-01-11 DIAGNOSIS — E7800 Pure hypercholesterolemia, unspecified: Secondary | ICD-10-CM

## 2017-01-11 DIAGNOSIS — E039 Hypothyroidism, unspecified: Secondary | ICD-10-CM | POA: Diagnosis not present

## 2017-01-11 DIAGNOSIS — I10 Essential (primary) hypertension: Secondary | ICD-10-CM | POA: Diagnosis not present

## 2017-01-11 LAB — COMPREHENSIVE METABOLIC PANEL
ALBUMIN: 4.1 g/dL (ref 3.5–5.2)
ALT: 13 U/L (ref 0–35)
AST: 18 U/L (ref 0–37)
Alkaline Phosphatase: 58 U/L (ref 39–117)
BUN: 12 mg/dL (ref 6–23)
CHLORIDE: 109 meq/L (ref 96–112)
CO2: 25 mEq/L (ref 19–32)
CREATININE: 1.03 mg/dL (ref 0.40–1.20)
Calcium: 9.8 mg/dL (ref 8.4–10.5)
GFR: 56.4 mL/min — ABNORMAL LOW (ref >60.00)
Glucose, Bld: 94 mg/dL (ref 70–99)
Potassium: 4.1 mEq/L (ref 3.5–5.1)
SODIUM: 141 meq/L (ref 135–145)
Total Bilirubin: 0.7 mg/dL (ref 0.2–1.2)
Total Protein: 7.2 g/dL (ref 6.0–8.3)

## 2017-01-11 LAB — CBC WITH DIFFERENTIAL/PLATELET
BASOS ABS: 0 10*3/uL (ref 0.0–0.1)
Basophils Relative: 0.4 % (ref 0.0–3.0)
EOS ABS: 0.1 10*3/uL (ref 0.0–0.7)
Eosinophils Relative: 1.9 % (ref 0.0–5.0)
HCT: 38.6 % (ref 36.0–46.0)
Hemoglobin: 13.1 g/dL (ref 12.0–15.0)
Lymphocytes Relative: 27.2 % (ref 12.0–46.0)
Lymphs Abs: 1.4 10*3/uL (ref 0.7–4.0)
MCHC: 33.9 g/dL (ref 30.0–36.0)
MCV: 90.8 fl (ref 78.0–100.0)
MONO ABS: 0.4 10*3/uL (ref 0.1–1.0)
Monocytes Relative: 7.5 % (ref 3.0–12.0)
NEUTROS PCT: 63 % (ref 43.0–77.0)
Neutro Abs: 3.1 10*3/uL (ref 1.4–7.7)
Platelets: 193 10*3/uL (ref 150.0–400.0)
RBC: 4.25 Mil/uL (ref 3.87–5.11)
RDW: 13.5 % (ref 11.5–15.5)
WBC: 5 10*3/uL (ref 4.0–10.5)

## 2017-01-11 LAB — LIPID PANEL
CHOLESTEROL: 169 mg/dL (ref 0–200)
HDL: 38.8 mg/dL — ABNORMAL LOW (ref >39.00)
LDL CALC: 103 mg/dL — AB (ref 0–99)
NonHDL: 130.51
Total CHOL/HDL Ratio: 4
Triglycerides: 139 mg/dL (ref 0.0–149.0)
VLDL: 27.8 mg/dL (ref 0.0–40.0)

## 2017-01-11 MED ORDER — GENTAMICIN SULFATE 0.1 % EX OINT: TOPICAL_OINTMENT | CUTANEOUS | 0 refills | Status: DC

## 2017-01-11 MED ORDER — TRIAMCINOLONE ACETONIDE 0.1 % EX CREA: 1.0000 "application " | TOPICAL_CREAM | Freq: Two times a day (BID) | CUTANEOUS | 0 refills | Status: DC | PRN

## 2017-01-11 NOTE — Progress Notes (Signed)
Pre-visit discussion using our clinic review tool. No additional management support is needed unless otherwise documented below in the visit note.  

## 2017-01-11 NOTE — Progress Notes (Signed)
Patient ID: [name], female   DOB: 1946/01/26, 71 y.o.   MRN: [id number]   Subjective:    Patient ID: [name], female    DOB: January 10, 1946, 71 y.o.   MRN: [id number]  HPI  Patient here for a scheduled follow up.  She reports she is doing relatively well.  Stays active.  Push mows her yard.  No chest pain.  No sob.  No acid reflux.  No abdominal pain.  Bowels moving.  Has previously had a nasal lesion.  Resolved with gentamycin ointment.  States has another place in her nose and request a refill.  Has not been persistent.  Brings in blood pressure readings from outside checks.  Her readings on occasion are a little low.  Not sure if cuff correlates.  No syncope or near syncope.  Occasional light headedness, but is rare. She relates this more to not drinking enough fluid.  We discussed importance of staying hydrated.     Past Medical History:  Diagnosis Date  . Allergy   . GERD (gastroesophageal reflux disease)   . Hemorrhoids   . History of colon polyps   . Hypertension   . Thyroid disease    Past Surgical History:  Procedure Laterality Date  . COLONOSCOPY    . MOUTH SURGERY     x 2  . TONSILLECTOMY     Family History  Problem Relation Age of Onset  . Hyperlipidemia Mother   . Heart disease Mother   . Diabetes Mother   . Stroke Father   . Colon cancer Brother        Half(Father Side)  . Colon cancer Sister        x 2 Half(Father Side)  . Colon cancer Cousin        Mother Side   . Esophageal cancer Neg Hx   . Rectal cancer Neg Hx   . Stomach cancer Neg Hx    Social History   Social History  . Marital status: Single    Spouse name: N/A  . Number of children: N/A  . Years of education: N/A   Occupational History  . Retired    Social History Main Topics  . Smoking status: Never Smoker  . Smokeless tobacco: Never Used  . Alcohol use No  . Drug use: No  . Sexual activity: Not Asked   Other Topics Concern  . None   Social History Narrative  . None     Outpatient Encounter Prescriptions as of 01/11/2017  Medication Sig  . lisinopril (PRINIVIL,ZESTRIL) 5 MG tablet TAKE THREE TABLETS EVERY DAY  . sodium chloride (OCEAN) 0.65 % nasal spray Place 1 spray into the nose as needed for congestion.  Marland Kitchen SYNTHROID 75 MCG tablet Take 1 tablet (75 mcg total) by mouth daily.  Marland Kitchen gentamicin ointment (GARAMYCIN) 0.1 % Use as directed - inside nose prn  . triamcinolone cream (KENALOG) 0.1 % Apply 1 application topically 2 (two) times daily as needed. Do not use on face, breast or genitals   No facility-administered encounter medications on file as of 01/11/2017.     Review of Systems  Constitutional: Negative for appetite change and unexpected weight change.  HENT: Negative for congestion and sinus pressure.   Respiratory: Negative for cough, chest tightness and shortness of breath.   Cardiovascular: Negative for chest pain, palpitations and leg swelling.  Gastrointestinal: Negative for abdominal pain, diarrhea, nausea and vomiting.  Genitourinary: Negative for difficulty urinating and dysuria.  Musculoskeletal: Negative for back pain  and joint swelling.  Skin: Negative for color change and rash.  Neurological: Negative for headaches.       Occasional light headedness as outlined.    Psychiatric/Behavioral: Negative for agitation and dysphoric mood.       Objective:    Physical Exam  Constitutional: She appears well-developed and well-nourished. No distress.  HENT:  Nose: Nose normal.  Mouth/Throat: Oropharynx is clear and moist.  Neck: Neck supple. No thyromegaly present.  Cardiovascular: Normal rate and regular rhythm.   Pulmonary/Chest: Breath sounds normal. No respiratory distress. She has no wheezes.  Abdominal: Soft. Bowel sounds are normal. There is no tenderness.  Musculoskeletal: She exhibits no edema or tenderness.  Lymphadenopathy:    She has no cervical adenopathy.  Skin: No rash noted. No erythema.  Psychiatric: She has a  normal mood and affect. Her behavior is normal.    BP 130/82 (BP Location: Left Arm, Patient Position: Sitting, Cuff Size: Normal)   Pulse 86   Temp 97.6 F (36.4 C) (Oral)   Resp 12   Ht 4\' 11"  (1.499 m)   Wt 128 lb (58.1 kg)   LMP 07/21/1991   SpO2 97%   BMI 25.85 kg/m  Wt Readings from Last 3 Encounters:  01/11/17 128 lb (58.1 kg)  07/07/16 132 lb 9.6 oz (60.1 kg)  03/18/16 132 lb (59.9 kg)     Lab Results  Component Value Date   WBC 5.0 01/11/2017   HGB 13.1 01/11/2017   HCT 38.6 01/11/2017   PLT 193.0 01/11/2017   GLUCOSE 94 01/11/2017   CHOL 169 01/11/2017   TRIG 139.0 01/11/2017   HDL 38.80 (L) 01/11/2017   LDLCALC 103 (H) 01/11/2017   ALT 13 01/11/2017   AST 18 01/11/2017   NA 141 01/11/2017   K 4.1 01/11/2017   CL 109 01/11/2017   CREATININE 1.03 01/11/2017   BUN 12 01/11/2017   CO2 25 01/11/2017   TSH 1.37 07/07/2016       Assessment & Plan:   Problem List Items Addressed This Visit    Essential hypertension, benign - Primary    Blood pressures reviewed from outside checks.  Some low readings.  Unsure if cuff correlates.  Checks here ok.  Recheck actually a little elevated (140s range).  Continue current medication regimen.  Stay hydrated.  Follow pressures.  Follow metabolic panel.        Relevant Orders   CBC with Differential/Platelet (Completed)   Comprehensive metabolic panel (Completed)   Hypercholesterolemia    Low cholesterol diet and exercise.  Follow lipid panel.        Relevant Orders   Lipid panel (Completed)   Hypothyroidism    On thyroid replacement.  Follow tsh.           Einar Pheasant, MD

## 2017-01-13 ENCOUNTER — Encounter: Payer: Self-pay | Admitting: Internal Medicine

## 2017-01-13 NOTE — Assessment & Plan Note (Signed)
On thyroid replacement.  Follow tsh.  

## 2017-01-13 NOTE — Assessment & Plan Note (Signed)
Low cholesterol diet and exercise.  Follow lipid panel.   

## 2017-01-13 NOTE — Assessment & Plan Note (Signed)
Blood pressures reviewed from outside checks.  Some low readings.  Unsure if cuff correlates.  Checks here ok.  Recheck actually a little elevated (140s range).  Continue current medication regimen.  Stay hydrated.  Follow pressures.  Follow metabolic panel.

## 2017-03-09 ENCOUNTER — Other Ambulatory Visit: Payer: Self-pay | Admitting: Internal Medicine

## 2017-03-29 ENCOUNTER — Other Ambulatory Visit: Payer: Self-pay | Admitting: Internal Medicine

## 2017-05-04 ENCOUNTER — Telehealth: Payer: Self-pay

## 2017-05-04 MED ORDER — LISINOPRIL 5 MG PO TABS: 5.0000 mg | ORAL_TABLET | Freq: Three times a day (TID) | ORAL | 1 refills | Status: DC

## 2017-05-04 NOTE — Telephone Encounter (Signed)
Called patient and reviewed chart she had been on tid so I changed it and sent to pharmacy.

## 2017-05-05 ENCOUNTER — Ambulatory Visit (INDEPENDENT_AMBULATORY_CARE_PROVIDER_SITE_OTHER): Payer: Medicare HMO | Admitting: *Deleted

## 2017-05-05 DIAGNOSIS — Z23 Encounter for immunization: Secondary | ICD-10-CM

## 2017-05-17 DIAGNOSIS — H524 Presbyopia: Secondary | ICD-10-CM | POA: Diagnosis not present

## 2017-05-17 DIAGNOSIS — H2513 Age-related nuclear cataract, bilateral: Secondary | ICD-10-CM | POA: Diagnosis not present

## 2017-05-24 DIAGNOSIS — R69 Illness, unspecified: Secondary | ICD-10-CM | POA: Diagnosis not present

## 2017-05-28 ENCOUNTER — Other Ambulatory Visit: Payer: Self-pay | Admitting: Internal Medicine

## 2017-07-06 ENCOUNTER — Other Ambulatory Visit: Payer: Self-pay | Admitting: Internal Medicine

## 2017-07-09 ENCOUNTER — Encounter: Payer: Self-pay | Admitting: Internal Medicine

## 2017-07-09 ENCOUNTER — Ambulatory Visit: Payer: Medicare HMO | Admitting: Internal Medicine

## 2017-07-09 VITALS — BP 132/78 | HR 91 | Temp 98.0°F | Ht <= 58 in | Wt 124.0 lb

## 2017-07-09 DIAGNOSIS — I10 Essential (primary) hypertension: Secondary | ICD-10-CM

## 2017-07-09 DIAGNOSIS — E7800 Pure hypercholesterolemia, unspecified: Secondary | ICD-10-CM | POA: Diagnosis not present

## 2017-07-09 DIAGNOSIS — Z800 Family history of malignant neoplasm of digestive organs: Secondary | ICD-10-CM

## 2017-07-09 DIAGNOSIS — Z0000 Encounter for general adult medical examination without abnormal findings: Secondary | ICD-10-CM | POA: Diagnosis not present

## 2017-07-09 DIAGNOSIS — E039 Hypothyroidism, unspecified: Secondary | ICD-10-CM

## 2017-07-09 LAB — COMPREHENSIVE METABOLIC PANEL
ALBUMIN: 4.5 g/dL (ref 3.5–5.2)
ALK PHOS: 62 U/L (ref 39–117)
ALT: 14 U/L (ref 0–35)
AST: 20 U/L (ref 0–37)
BILIRUBIN TOTAL: 0.5 mg/dL (ref 0.2–1.2)
BUN: 22 mg/dL (ref 6–23)
CALCIUM: 9.8 mg/dL (ref 8.4–10.5)
CO2: 28 mEq/L (ref 19–32)
Chloride: 106 mEq/L (ref 96–112)
Creatinine, Ser: 1.17 mg/dL (ref 0.40–1.20)
GFR: 48.62 mL/min — ABNORMAL LOW (ref >60.00)
Glucose, Bld: 90 mg/dL (ref 70–99)
Potassium: 4.4 mEq/L (ref 3.5–5.1)
Sodium: 141 mEq/L (ref 135–145)
TOTAL PROTEIN: 8 g/dL (ref 6.0–8.3)

## 2017-07-09 LAB — LIPID PANEL
CHOL/HDL RATIO: 4
Cholesterol: 167 mg/dL (ref 0–200)
HDL: 46 mg/dL (ref >39.00)
LDL Cholesterol: 97 mg/dL (ref 0–99)
NonHDL: 121.09
TRIGLYCERIDES: 119 mg/dL (ref 0.0–149.0)
VLDL: 23.8 mg/dL (ref 0.0–40.0)

## 2017-07-09 LAB — TSH: TSH: 0.93 u[IU]/mL (ref 0.35–4.50)

## 2017-07-09 NOTE — Assessment & Plan Note (Signed)
Physical today 07/09/17.  Declines mammogram.  Colonoscopy 12/2014.  Recommended f/u 12/2017.

## 2017-07-09 NOTE — Progress Notes (Signed)
Patient ID: Ann Perkins, female   DOB: 03-30-1946, 71 y.o.   MRN: [id number]   Subjective:    Patient ID: Ann Perkins, female    DOB: Apr 12, 1946, 71 y.o.   MRN: [id number]  HPI  Patient here for her physical exam.  She reports she is doing relatively well.  Reviewed blood pressure readings on outside checks.  Blood pressure averaging 100-130/60-80.  No chest pain.  No sob.  No acid reflux.  No abdominal pain.  Bowels moving.  Discussed pneumonia shot.  She declines.  Left nail change.  She declines further intervention.  Due colonoscopy in 2019.  She request referral to be made.     Past Medical History:  Diagnosis Date  . Allergy   . GERD (gastroesophageal reflux disease)   . Hemorrhoids   . History of colon polyps   . Hypertension   . Thyroid disease    Past Surgical History:  Procedure Laterality Date  . COLONOSCOPY    . MOUTH SURGERY     x 2  . TONSILLECTOMY     Family History  Problem Relation Age of Onset  . Hyperlipidemia Mother   . Heart disease Mother   . Diabetes Mother   . Stroke Father   . Colon cancer Brother        Half(Father Side)  . Colon cancer Sister        x 2 Half(Father Side)  . Colon cancer Cousin        Mother Side   . Esophageal cancer Neg Hx   . Rectal cancer Neg Hx   . Stomach cancer Neg Hx    Social History   Socioeconomic History  . Marital status: Single    Spouse name: None  . Number of children: None  . Years of education: None  . Highest education level: None  Social Needs  . Financial resource strain: None  . Food insecurity - worry: None  . Food insecurity - inability: None  . Transportation needs - medical: None  . Transportation needs - non-medical: None  Occupational History  . Occupation: Retired  Tobacco Use  . Smoking status: Never Smoker  . Smokeless tobacco: Never Used  Substance and Sexual Activity  . Alcohol use: No    Alcohol/week: 0.0 oz  . Drug use: No  . Sexual activity: None  Other Topics Concern  .  None  Social History Narrative  . None    Outpatient Encounter Medications as of 07/09/2017  Medication Sig  . lisinopril (PRINIVIL,ZESTRIL) 5 MG tablet TAKE ONE TABLET 3 TIMES DAILY  . sodium chloride (OCEAN) 0.65 % nasal spray Place 1 spray into the nose as needed for congestion.  Marland Kitchen SYNTHROID 75 MCG tablet TAKE ONE TABLET DAILY  . gentamicin ointment (GARAMYCIN) 0.1 % Use as directed - inside nose prn (Patient not taking: Reported on 07/09/2017)  . triamcinolone cream (KENALOG) 0.1 % Apply 1 application topically 2 (two) times daily as needed. Do not use on face, breast or genitals (Patient not taking: Reported on 07/09/2017)   No facility-administered encounter medications on file as of 07/09/2017.     Review of Systems  Constitutional: Negative for appetite change and unexpected weight change.  HENT: Negative for congestion and sinus pressure.   Eyes: Negative for pain and visual disturbance.  Respiratory: Negative for cough, chest tightness and shortness of breath.   Cardiovascular: Negative for chest pain, palpitations and leg swelling.  Gastrointestinal: Negative for abdominal pain, diarrhea, nausea and vomiting.  Genitourinary: Negative for difficulty urinating and dysuria.  Musculoskeletal: Negative for back pain, joint swelling and myalgias.  Skin: Negative for color change and rash.  Neurological: Negative for dizziness, light-headedness and headaches.  Hematological: Negative for adenopathy. Does not bruise/bleed easily.  Psychiatric/Behavioral: Negative for agitation and dysphoric mood.       Objective:     Blood pressure rechecked by me:  132/78  Physical Exam  Constitutional: She is oriented to person, place, and time. She appears well-developed and well-nourished. No distress.  HENT:  Nose: Nose normal.  Mouth/Throat: Oropharynx is clear and moist.  Eyes: Right eye exhibits no discharge. Left eye exhibits no discharge. No scleral icterus.  Neck: Neck supple.  No thyromegaly present.  Cardiovascular: Normal rate and regular rhythm.  Pulmonary/Chest: Breath sounds normal. No accessory muscle usage. No tachypnea. No respiratory distress. She has no decreased breath sounds. She has no wheezes. She has no rhonchi. Right breast exhibits no inverted nipple, no mass, no nipple discharge and no tenderness (no axillary adenopathy). Left breast exhibits no inverted nipple, no mass, no nipple discharge and no tenderness (no axilarry adenopathy).  Abdominal: Soft. Bowel sounds are normal. There is no tenderness.  Musculoskeletal: She exhibits no edema or tenderness.  Lymphadenopathy:    She has no cervical adenopathy.  Neurological: She is alert and oriented to person, place, and time.  Skin: Skin is warm. No rash noted. No erythema.  Psychiatric: She has a normal mood and affect. Her behavior is normal.    BP 132/78   Pulse 91   Temp 98 F (36.7 C) (Oral)   Ht 4' 9.75" (1.467 m)   Wt 124 lb (56.2 kg)   LMP 07/21/1991   BMI 26.14 kg/m  Wt Readings from Last 3 Encounters:  07/09/17 124 lb (56.2 kg)  01/11/17 128 lb (58.1 kg)  07/07/16 132 lb 9.6 oz (60.1 kg)     Lab Results  Component Value Date   WBC 5.0 01/11/2017   HGB 13.1 01/11/2017   HCT 38.6 01/11/2017   PLT 193.0 01/11/2017   GLUCOSE 90 07/09/2017   CHOL 167 07/09/2017   TRIG 119.0 07/09/2017   HDL 46.00 07/09/2017   LDLCALC 97 07/09/2017   ALT 14 07/09/2017   AST 20 07/09/2017   NA 141 07/09/2017   K 4.4 07/09/2017   CL 106 07/09/2017   CREATININE 1.17 07/09/2017   BUN 22 07/09/2017   CO2 28 07/09/2017   TSH 0.93 07/09/2017       Assessment & Plan:   Problem List Items Addressed This Visit    Essential hypertension, benign    Blood pressure under good control.  Continue same medication regimen.  Follow pressures.  Follow metabolic panel.        Relevant Orders   Comprehensive metabolic panel (Completed)   Family history of malignant neoplasm of gastrointestinal tract     Colonoscopy 12/2014 - three sessile polyps - ascending colon - tubular adenoma and internal hemorrhoids.  Recommended f/u colonoscopy in 3 years.  She requested referral to GI now with plans for f/u colonoscopy.        Health care maintenance    Physical today 07/09/17.  Declines mammogram.  Colonoscopy 12/2014.  Recommended f/u 12/2017.        Hypercholesterolemia    Low cholesterol diet and exercise.  Follow lipid panel.       Relevant Orders   Lipid panel (Completed)   Hypothyroidism    On thyroid replacement.  Follow  tsh.       Relevant Orders   TSH (Completed)    Other Visit Diagnoses    Routine general medical examination at a health care facility    -  Primary       Einar Pheasant, MD

## 2017-07-11 ENCOUNTER — Encounter: Payer: Self-pay | Admitting: Internal Medicine

## 2017-07-11 NOTE — Assessment & Plan Note (Signed)
Colonoscopy 12/2014 - three sessile polyps - ascending colon - tubular adenoma and internal hemorrhoids.  Recommended f/u colonoscopy in 3 years.  She requested referral to GI now with plans for f/u colonoscopy.

## 2017-07-11 NOTE — Assessment & Plan Note (Signed)
Low cholesterol diet and exercise.  Follow lipid panel.   

## 2017-07-11 NOTE — Assessment & Plan Note (Signed)
Blood pressure under good control.  Continue same medication regimen.  Follow pressures.  Follow metabolic panel.   

## 2017-07-11 NOTE — Assessment & Plan Note (Signed)
On thyroid replacement.  Follow tsh.  

## 2017-07-22 ENCOUNTER — Telehealth: Payer: Self-pay

## 2017-07-22 NOTE — Telephone Encounter (Signed)
Copied from [place] [phone]. Topic: General - Other >> Jul 22, 2017  3:45 PM Ann Perkins wrote: Reason for CRM: Patient called to request a copy of her lab results be mailed to her. She do not have a computer and would like a hard copy of the results.  [address] [place] [address]

## 2017-07-22 NOTE — Telephone Encounter (Signed)
Letter has been sent

## 2017-07-26 DIAGNOSIS — R69 Illness, unspecified: Secondary | ICD-10-CM | POA: Diagnosis not present

## 2017-07-29 ENCOUNTER — Other Ambulatory Visit: Payer: Self-pay | Admitting: Internal Medicine

## 2017-08-04 ENCOUNTER — Other Ambulatory Visit: Payer: Self-pay | Admitting: Internal Medicine

## 2017-08-12 DIAGNOSIS — R69 Illness, unspecified: Secondary | ICD-10-CM | POA: Diagnosis not present

## 2017-09-24 ENCOUNTER — Other Ambulatory Visit: Payer: Self-pay | Admitting: Internal Medicine

## 2017-10-15 ENCOUNTER — Ambulatory Visit: Payer: Medicare HMO | Admitting: Internal Medicine

## 2017-10-20 ENCOUNTER — Encounter: Payer: Self-pay | Admitting: Internal Medicine

## 2017-10-20 ENCOUNTER — Ambulatory Visit (INDEPENDENT_AMBULATORY_CARE_PROVIDER_SITE_OTHER): Payer: Medicare HMO | Admitting: Internal Medicine

## 2017-10-20 VITALS — BP 138/72 | HR 86 | Temp 98.1°F | Resp 18 | Wt 132.4 lb

## 2017-10-20 DIAGNOSIS — E7800 Pure hypercholesterolemia, unspecified: Secondary | ICD-10-CM

## 2017-10-20 DIAGNOSIS — I10 Essential (primary) hypertension: Secondary | ICD-10-CM

## 2017-10-20 DIAGNOSIS — E039 Hypothyroidism, unspecified: Secondary | ICD-10-CM | POA: Diagnosis not present

## 2017-10-20 DIAGNOSIS — R252 Cramp and spasm: Secondary | ICD-10-CM

## 2017-10-20 LAB — CBC WITH DIFFERENTIAL/PLATELET
BASOS PCT: 0.5 % (ref 0.0–3.0)
Basophils Absolute: 0 10*3/uL (ref 0.0–0.1)
EOS ABS: 0.1 10*3/uL (ref 0.0–0.7)
Eosinophils Relative: 1.7 % (ref 0.0–5.0)
HCT: 41.6 % (ref 36.0–46.0)
HEMOGLOBIN: 14.2 g/dL (ref 12.0–15.0)
LYMPHS PCT: 26.4 % (ref 12.0–46.0)
Lymphs Abs: 1.7 10*3/uL (ref 0.7–4.0)
MCHC: 34.3 g/dL (ref 30.0–36.0)
MCV: 90.5 fl (ref 78.0–100.0)
MONO ABS: 0.4 10*3/uL (ref 0.1–1.0)
Monocytes Relative: 6.8 % (ref 3.0–12.0)
Neutro Abs: 4.2 10*3/uL (ref 1.4–7.7)
Neutrophils Relative %: 64.6 % (ref 43.0–77.0)
Platelets: 219 10*3/uL (ref 150.0–400.0)
RBC: 4.59 Mil/uL (ref 3.87–5.11)
RDW: 13.4 % (ref 11.5–15.5)
WBC: 6.6 10*3/uL (ref 4.0–10.5)

## 2017-10-20 LAB — BASIC METABOLIC PANEL
BUN: 15 mg/dL (ref 6–23)
CALCIUM: 10.2 mg/dL (ref 8.4–10.5)
CHLORIDE: 105 meq/L (ref 96–112)
CO2: 28 meq/L (ref 19–32)
Creatinine, Ser: 1 mg/dL (ref 0.40–1.20)
GFR: 58.23 mL/min — ABNORMAL LOW (ref >60.00)
GLUCOSE: 90 mg/dL (ref 70–99)
POTASSIUM: 4 meq/L (ref 3.5–5.1)
SODIUM: 138 meq/L (ref 135–145)

## 2017-10-20 LAB — TSH: TSH: 0.83 u[IU]/mL (ref 0.35–4.50)

## 2017-10-20 NOTE — Progress Notes (Signed)
Patient ID: Ann Perkins, female   DOB: March 09, 1946, 72 y.o.   MRN: [id number]   Subjective:    Patient ID: Ann Perkins, female    DOB: [birth year], 72 y.o.   MRN: [id number]  HPI  Patient here for a scheduled follow up.  She reports she is doing well.  Brings in blood pressure readings.  Her blood pressures are mostly averaging 114-130s/60-80.  No chest pain.  No sob.  No acid reflux.  No abdominal pain.  Bowels moving.  Discussed the need for colonoscopy.     Past Medical History:  Diagnosis Date  . Allergy   . GERD (gastroesophageal reflux disease)   . Hemorrhoids   . History of colon polyps   . Hypertension   . Thyroid disease    Past Surgical History:  Procedure Laterality Date  . COLONOSCOPY    . MOUTH SURGERY     x 2  . TONSILLECTOMY     Family History  Problem Relation Age of Onset  . Hyperlipidemia Mother   . Heart disease Mother   . Diabetes Mother   . Stroke Father   . Colon cancer Brother        Half(Father Side)  . Colon cancer Sister        x 2 Half(Father Side)  . Colon cancer Cousin        Mother Side   . Esophageal cancer Neg Hx   . Rectal cancer Neg Hx   . Stomach cancer Neg Hx    Social History   Socioeconomic History  . Marital status: Single    Spouse name: Not on file  . Number of children: Not on file  . Years of education: Not on file  . Highest education level: Not on file  Occupational History  . Occupation: Retired  Scientific laboratory technician  . Financial resource strain: Not on file  . Food insecurity:    Worry: Not on file    Inability: Not on file  . Transportation needs:    Medical: Not on file    Non-medical: Not on file  Tobacco Use  . Smoking status: Never Smoker  . Smokeless tobacco: Never Used  Substance and Sexual Activity  . Alcohol use: No    Alcohol/week: 0.0 oz  . Drug use: No  . Sexual activity: Not on file  Lifestyle  . Physical activity:    Days per week: Not on file    Minutes per session: Not on file  . Stress: Not  on file  Relationships  . Social connections:    Talks on phone: Not on file    Gets together: Not on file    Attends religious service: Not on file    Active member of club or organization: Not on file    Attends meetings of clubs or organizations: Not on file    Relationship status: Not on file  Other Topics Concern  . Not on file  Social History Narrative  . Not on file    Outpatient Encounter Medications as of 10/20/2017  Medication Sig  . gentamicin ointment (GARAMYCIN) 0.1 % Use as directed - inside nose prn (Patient not taking: Reported on 07/09/2017)  . lisinopril (PRINIVIL,ZESTRIL) 5 MG tablet TAKE ONE TABLET BY MOUTH 3 TIMES DAILY  . sodium chloride (OCEAN) 0.65 % nasal spray Place 1 spray into the nose as needed for congestion.  Marland Kitchen SYNTHROID 75 MCG tablet TAKE ONE TABLET BY MOUTH EVERY DAY  . triamcinolone cream (KENALOG) 0.1 %  Apply 1 application topically 2 (two) times daily as needed. Do not use on face, breast or genitals (Patient not taking: Reported on 07/09/2017)   No facility-administered encounter medications on file as of 10/20/2017.     Review of Systems  Constitutional: Negative for appetite change and unexpected weight change.  HENT: Negative for congestion and sinus pressure.   Respiratory: Negative for cough, chest tightness and shortness of breath.   Cardiovascular: Negative for chest pain, palpitations and leg swelling.  Gastrointestinal: Negative for abdominal pain, diarrhea, nausea and vomiting.  Genitourinary: Negative for difficulty urinating and dysuria.  Musculoskeletal: Negative for joint swelling and myalgias.  Skin: Negative for color change and rash.  Neurological: Negative for dizziness, light-headedness and headaches.  Psychiatric/Behavioral: Negative for agitation and dysphoric mood.      Objective:     Blood pressure rechecked by me:  138/72  Physical Exam  Constitutional: She appears well-developed and well-nourished. No distress.    HENT:  Nose: Nose normal.  Mouth/Throat: Oropharynx is clear and moist.  Neck: Neck supple. No thyromegaly present.  Cardiovascular: Normal rate and regular rhythm.  Pulmonary/Chest: Breath sounds normal. No respiratory distress. She has no wheezes.  Abdominal: Soft. Bowel sounds are normal. There is no tenderness.  Musculoskeletal: She exhibits no edema or tenderness.  Lymphadenopathy:    She has no cervical adenopathy.  Skin: No rash noted. No erythema.  Psychiatric: She has a normal mood and affect. Her behavior is normal.    BP 138/72   Pulse 86   Temp 98.1 F (36.7 C) (Oral)   Resp 18   Wt 132 lb 6.4 oz (60.1 kg)   LMP 07/21/1991   SpO2 96%   BMI 27.91 kg/m  Wt Readings from Last 3 Encounters:  10/20/17 132 lb 6.4 oz (60.1 kg)  07/09/17 124 lb (56.2 kg)  01/11/17 128 lb (58.1 kg)     Lab Results  Component Value Date   WBC 6.6 10/20/2017   HGB 14.2 10/20/2017   HCT 41.6 10/20/2017   PLT 219.0 10/20/2017   GLUCOSE 90 10/20/2017   CHOL 167 07/09/2017   TRIG 119.0 07/09/2017   HDL 46.00 07/09/2017   LDLCALC 97 07/09/2017   ALT 14 07/09/2017   AST 20 07/09/2017   NA 138 10/20/2017   K 4.0 10/20/2017   CL 105 10/20/2017   CREATININE 1.00 10/20/2017   BUN 15 10/20/2017   CO2 28 10/20/2017   TSH 0.83 10/20/2017       Assessment & Plan:   Problem List Items Addressed This Visit    Essential hypertension, benign - Primary    Blood pressure as outlined.  Continue same medication regimen.  Follow pressures.  Follow metabolic panel.        Relevant Orders   Basic metabolic panel (Completed)   Hypercholesterolemia    Low cholesterol diet and exercise.  Follow lipid panel.        Hypothyroidism    On thyroid replacement.  Follow tsh.        Relevant Orders   TSH (Completed)    Other Visit Diagnoses    Leg cramps       stretches.  check labs.     Relevant Orders   CBC with Differential/Platelet (Completed)       Einar Pheasant, MD

## 2017-10-24 ENCOUNTER — Encounter: Payer: Self-pay | Admitting: Internal Medicine

## 2017-10-24 NOTE — Assessment & Plan Note (Signed)
On thyroid replacement.  Follow tsh.  

## 2017-10-24 NOTE — Assessment & Plan Note (Signed)
Blood pressure as outlined.  Continue same medication regimen.  Follow pressures.  Follow metabolic panel.  

## 2017-10-24 NOTE — Assessment & Plan Note (Signed)
Low cholesterol diet and exercise.  Follow lipid panel.   

## 2017-10-25 ENCOUNTER — Telehealth: Payer: Self-pay | Admitting: Internal Medicine

## 2017-10-25 ENCOUNTER — Encounter: Payer: Self-pay | Admitting: *Deleted

## 2017-10-25 ENCOUNTER — Other Ambulatory Visit: Payer: Self-pay | Admitting: Internal Medicine

## 2017-10-25 DIAGNOSIS — Z1211 Encounter for screening for malignant neoplasm of colon: Secondary | ICD-10-CM

## 2017-10-25 NOTE — Telephone Encounter (Signed)
ok 

## 2017-10-25 NOTE — Progress Notes (Signed)
Order placed for GI referral.   

## 2017-10-26 ENCOUNTER — Encounter: Payer: Self-pay | Admitting: Internal Medicine

## 2017-10-26 NOTE — Telephone Encounter (Signed)
Pt is scheduled for 01-05-18 for colon and 12-22-17 for previsit.  Patient notified and letter mailed

## 2017-11-05 DIAGNOSIS — S52502A Unspecified fracture of the lower end of left radius, initial encounter for closed fracture: Secondary | ICD-10-CM | POA: Diagnosis not present

## 2017-11-08 ENCOUNTER — Telehealth: Payer: Self-pay | Admitting: Internal Medicine

## 2017-11-08 ENCOUNTER — Telehealth: Payer: Self-pay

## 2017-11-08 NOTE — Telephone Encounter (Signed)
Patient has since called office back to inform us that she has an appointment with [facility].  Copied from [place] [phone]. Topic: Referral - Request >> Nov 08, 2017  8:08 AM Robina Ade, Helene Kelp D wrote: Reason for CRM: Patient called and she broke her left wrist and would like a referral to go to [facility] in [place] to see Dr. Amedeo Plenty. Please call patient back with more information.  >> Nov 08, 2017  4:27 PM Aurelio Brash B wrote: Pt called back to say she has an apt now  for ortho

## 2017-11-08 NOTE — Telephone Encounter (Signed)
FYI

## 2017-11-08 NOTE — Telephone Encounter (Signed)
Copied from [place] [phone]. Topic: Referral - Request >> Nov 08, 2017  8:08 AM Robina Ade, Helene Kelp D wrote: Reason for CRM: Patient called and she broke her left wrist and would like a referral to go to [facility] in [place] to see Dr. Amedeo Plenty. Patient is not sure if they taker her insurance [employer]. Please call patient back with more information.

## 2017-11-08 NOTE — Telephone Encounter (Signed)
Pt is calling back to let md know she will call [facility] to see if she can make her own appt

## 2017-11-09 DIAGNOSIS — M25532 Pain in left wrist: Secondary | ICD-10-CM | POA: Diagnosis not present

## 2017-11-09 DIAGNOSIS — S52502A Unspecified fracture of the lower end of left radius, initial encounter for closed fracture: Secondary | ICD-10-CM | POA: Insufficient documentation

## 2017-11-09 NOTE — Telephone Encounter (Signed)
Tell her I do not mind placing an order for the referral.  Let us know if any problems.

## 2017-11-09 NOTE — Telephone Encounter (Signed)
Called pt, unable to leave message d/t no v/m

## 2017-11-18 DIAGNOSIS — S52502A Unspecified fracture of the lower end of left radius, initial encounter for closed fracture: Secondary | ICD-10-CM | POA: Diagnosis not present

## 2017-11-18 DIAGNOSIS — M25532 Pain in left wrist: Secondary | ICD-10-CM | POA: Diagnosis not present

## 2017-11-22 NOTE — Telephone Encounter (Signed)
Patient already has an appt set up

## 2017-12-03 DIAGNOSIS — S52502A Unspecified fracture of the lower end of left radius, initial encounter for closed fracture: Secondary | ICD-10-CM | POA: Diagnosis not present

## 2017-12-03 DIAGNOSIS — M25532 Pain in left wrist: Secondary | ICD-10-CM | POA: Diagnosis not present

## 2017-12-09 DIAGNOSIS — R69 Illness, unspecified: Secondary | ICD-10-CM | POA: Diagnosis not present

## 2017-12-16 DIAGNOSIS — S52502A Unspecified fracture of the lower end of left radius, initial encounter for closed fracture: Secondary | ICD-10-CM | POA: Diagnosis not present

## 2017-12-16 DIAGNOSIS — M25532 Pain in left wrist: Secondary | ICD-10-CM | POA: Diagnosis not present

## 2017-12-21 DIAGNOSIS — R69 Illness, unspecified: Secondary | ICD-10-CM | POA: Diagnosis not present

## 2017-12-22 ENCOUNTER — Other Ambulatory Visit: Payer: Self-pay

## 2017-12-22 ENCOUNTER — Ambulatory Visit (AMBULATORY_SURGERY_CENTER): Payer: Self-pay | Admitting: *Deleted

## 2017-12-22 VITALS — Ht 59.0 in | Wt 133.0 lb

## 2017-12-22 DIAGNOSIS — Z860100 Personal history of colon polyps, unspecified: Secondary | ICD-10-CM

## 2017-12-22 DIAGNOSIS — Z800 Family history of malignant neoplasm of digestive organs: Secondary | ICD-10-CM

## 2017-12-22 MED ORDER — MOVIPREP 100 G PO SOLR: ORAL | 0 refills | Status: DC

## 2017-12-22 NOTE — Progress Notes (Signed)
Patient denies any allergies to eggs or soy. Patient denies any problems with anesthesia/sedation. Patient denies any oxygen use at home. Patient denies taking any diet/weight loss medications or blood thinners. EMMI education assisgned offered, pt declined, no email/computer per pt. During PV today patient request the Moviprep, she states the Suprep was hard on her stomach. Pt used the Moviprep before and had "excellent" results. Patient is aware the cost of the Moviprep and is okay with having to pay for this. Moviprep instructions given to pt.

## 2017-12-24 ENCOUNTER — Encounter: Payer: Self-pay | Admitting: Internal Medicine

## 2017-12-27 ENCOUNTER — Other Ambulatory Visit: Payer: Self-pay | Admitting: Internal Medicine

## 2018-01-03 DIAGNOSIS — M25532 Pain in left wrist: Secondary | ICD-10-CM | POA: Diagnosis not present

## 2018-01-03 DIAGNOSIS — S52502A Unspecified fracture of the lower end of left radius, initial encounter for closed fracture: Secondary | ICD-10-CM | POA: Diagnosis not present

## 2018-01-05 ENCOUNTER — Encounter: Payer: Self-pay | Admitting: Internal Medicine

## 2018-01-05 ENCOUNTER — Other Ambulatory Visit: Payer: Self-pay

## 2018-01-05 ENCOUNTER — Ambulatory Visit (AMBULATORY_SURGERY_CENTER): Payer: Medicare HMO | Admitting: Internal Medicine

## 2018-01-05 VITALS — BP 143/80 | HR 71 | Temp 99.1°F | Resp 10 | Ht 59.0 in | Wt 133.0 lb

## 2018-01-05 DIAGNOSIS — Z86010 Personal history of colonic polyps: Secondary | ICD-10-CM | POA: Diagnosis not present

## 2018-01-05 DIAGNOSIS — D122 Benign neoplasm of ascending colon: Secondary | ICD-10-CM | POA: Diagnosis not present

## 2018-01-05 DIAGNOSIS — I10 Essential (primary) hypertension: Secondary | ICD-10-CM | POA: Diagnosis not present

## 2018-01-05 DIAGNOSIS — Z800 Family history of malignant neoplasm of digestive organs: Secondary | ICD-10-CM

## 2018-01-05 DIAGNOSIS — Z1211 Encounter for screening for malignant neoplasm of colon: Secondary | ICD-10-CM | POA: Diagnosis not present

## 2018-01-05 MED ORDER — SODIUM CHLORIDE 0.9 % IV SOLN: 500.0000 mL | Freq: Once | INTRAVENOUS | Status: DC

## 2018-01-05 NOTE — Progress Notes (Signed)
Report to PACU, RN, vss, BBS= Clear.  

## 2018-01-05 NOTE — Progress Notes (Signed)
Pt's states no medical or surgical changes since previsit or office visit. 

## 2018-01-05 NOTE — Patient Instructions (Signed)
YOU HAD AN ENDOSCOPIC PROCEDURE TODAY AT THE [facility]:   Refer to the procedure report that was given to you for any specific questions about what was found during the examination.  If the procedure report does not answer your questions, please call your gastroenterologist to clarify.  If you requested that your care partner not be given the details of your procedure findings, then the procedure report has been included in a sealed envelope for you to review at your convenience later.  YOU SHOULD EXPECT: Some feelings of bloating in the abdomen. Passage of more gas than usual.  Walking can help get rid of the air that was put into your GI tract during the procedure and reduce the bloating. If you had a lower endoscopy (such as a colonoscopy or flexible sigmoidoscopy) you may notice spotting of blood in your stool or on the toilet paper. If you underwent a bowel prep for your procedure, you may not have a normal bowel movement for a few days.  Please Note:  You might notice some irritation and congestion in your nose or some drainage.  This is from the oxygen used during your procedure.  There is no need for concern and it should clear up in a day or so.  SYMPTOMS TO REPORT IMMEDIATELY:   Following lower endoscopy (colonoscopy or flexible sigmoidoscopy):  Excessive amounts of blood in the stool  Significant tenderness or worsening of abdominal pains  Swelling of the abdomen that is new, acute  Fever of 100F or higher  For urgent or emergent issues, a gastroenterologist can be reached at any hour by calling [phone].   DIET:  We do recommend a small meal at first, but then you may proceed to your regular diet.  Drink plenty of fluids but you should avoid alcoholic beverages for 24 hours.  ACTIVITY:  You should plan to take it easy for the rest of today and you should NOT DRIVE or use heavy machinery until tomorrow (because of the sedation medicines used during the test).     FOLLOW UP: Our staff will call the number listed on your records the next business day following your procedure to check on you and address any questions or concerns that you may have regarding the information given to you following your procedure. If we do not reach you, we will leave a message.  However, if you are feeling well and you are not experiencing any problems, there is no need to return our call.  We will assume that you have returned to your regular daily activities without incident.  If any biopsies were taken you will be contacted by phone or by letter within the next 1-3 weeks.  Please call us at [phone] if you have not heard about the biopsies in 3 weeks.   Await for biopsy results Polyps (handout given) Hemorrhoids (handout given) Diverticulosis (handout given)  SIGNATURES/CONFIDENTIALITY: You and/or your care partner have signed paperwork which will be entered into your electronic medical record.  These signatures attest to the fact that that the information above on your After Visit Summary has been reviewed and is understood.  Full responsibility of the confidentiality of this discharge information lies with you and/or your care-partner. 

## 2018-01-05 NOTE — Op Note (Signed)
[place] Patient Name: Ann Perkins Procedure Date: 01/05/2018 11:34 AM MRN: [id number] Endoscopist: Jerene Bears , MD Age: 72 Referring MD:  Date of Birth: 1945/12/29 Gender: Female Account #: [id number] Procedure:                Colonoscopy Indications:              Surveillance: Personal history of adenomatous                            polyps on last colonoscopy 3 years ago Medicines:                Monitored Anesthesia Care Procedure:                Pre-Anesthesia Assessment:                           - Prior to the procedure, a History and Physical                            was performed, and patient medications and                            allergies were reviewed. The patient's tolerance of                            previous anesthesia was also reviewed. The risks                            and benefits of the procedure and the sedation                            options and risks were discussed with the patient.                            All questions were answered, and informed consent                            was obtained. Prior Anticoagulants: The patient has                            taken no previous anticoagulant or antiplatelet                            agents. ASA Grade Assessment: III - A patient with                            severe systemic disease. After reviewing the risks                            and benefits, the patient was deemed in                            satisfactory condition to undergo the procedure.  After obtaining informed consent, the colonoscope                            was passed under direct vision. Throughout the                            procedure, the patient's blood pressure, pulse, and                            oxygen saturations were monitored continuously. The                            Colonoscope was introduced through the anus and                            advanced to the cecum,  identified by appendiceal                            orifice and ileocecal valve. The colonoscopy was                            performed without difficulty. The patient tolerated                            the procedure well. The quality of the bowel                            preparation was good. The ileocecal valve,                            appendiceal orifice, and rectum were photographed. Scope In: 11:44:06 AM Scope Out: 11:58:38 AM Scope Withdrawal Time: 0 hours 10 minutes 59 seconds  Total Procedure Duration: 0 hours 14 minutes 32 seconds  Findings:                 The digital rectal exam was normal.                           A 8 mm polyp was found in the ascending colon. The                            polyp was sessile. The polyp was removed with a                            cold snare. Resection and retrieval were complete.                           Multiple small and large-mouthed diverticula were                            found in the sigmoid colon and descending colon.                           Internal hemorrhoids were found during  retroflexion. The hemorrhoids were small. Complications:            No immediate complications. Estimated Blood Loss:     Estimated blood loss was minimal. Impression:               - One 8 mm polyp in the ascending colon, removed                            with a cold snare. Resected and retrieved.                           - Moderate diverticulosis in the sigmoid colon and                            in the descending colon.                           - Small internal hemorrhoids. Recommendation:           - Patient has a contact number available for                            emergencies. The signs and symptoms of potential                            delayed complications were discussed with the                            patient. Return to normal activities tomorrow.                            Written discharge  instructions were provided to the                            patient.                           - Resume previous diet.                           - Continue present medications.                           - Await pathology results.                           - Repeat colonoscopy in 5 years for surveillance. Jerene Bears, MD 01/05/2018 12:08:09 PM This report has been signed electronically.

## 2018-01-05 NOTE — Progress Notes (Signed)
Called to room to assist during endoscopic procedure.  Patient ID and intended procedure confirmed with present staff. Received instructions for my participation in the procedure from the performing physician.  

## 2018-01-06 ENCOUNTER — Telehealth: Payer: Self-pay

## 2018-01-06 NOTE — Telephone Encounter (Signed)
  Follow up Call-  Call back number 01/05/2018  Post procedure Call Back phone  # [phone]  Permission to leave phone message Yes  Some recent data might be hidden     Patient questions:  Do you have a fever, pain , or abdominal swelling? No. Pain Score  0 *  Have you tolerated food without any problems? Yes.    Have you been able to return to your normal activities? Yes.    Do you have any questions about your discharge instructions: Diet   No. Medications  No. Follow up visit  No.  Do you have questions or concerns about your Care? No.  Actions: * If pain score is 4 or above: No action needed, pain <4.

## 2018-01-10 ENCOUNTER — Encounter: Payer: Self-pay | Admitting: Internal Medicine

## 2018-01-25 ENCOUNTER — Other Ambulatory Visit: Payer: Self-pay | Admitting: Internal Medicine

## 2018-02-10 DIAGNOSIS — S52502D Unspecified fracture of the lower end of left radius, subsequent encounter for closed fracture with routine healing: Secondary | ICD-10-CM | POA: Diagnosis not present

## 2018-02-10 DIAGNOSIS — G5602 Carpal tunnel syndrome, left upper limb: Secondary | ICD-10-CM | POA: Diagnosis not present

## 2018-03-03 ENCOUNTER — Ambulatory Visit (INDEPENDENT_AMBULATORY_CARE_PROVIDER_SITE_OTHER): Payer: Medicare HMO | Admitting: Internal Medicine

## 2018-03-03 ENCOUNTER — Encounter: Payer: Self-pay | Admitting: Internal Medicine

## 2018-03-03 VITALS — BP 124/78 | HR 87 | Temp 97.6°F | Ht <= 58 in | Wt 134.6 lb

## 2018-03-03 DIAGNOSIS — S52502D Unspecified fracture of the lower end of left radius, subsequent encounter for closed fracture with routine healing: Secondary | ICD-10-CM | POA: Diagnosis not present

## 2018-03-03 DIAGNOSIS — E7800 Pure hypercholesterolemia, unspecified: Secondary | ICD-10-CM | POA: Diagnosis not present

## 2018-03-03 DIAGNOSIS — I10 Essential (primary) hypertension: Secondary | ICD-10-CM

## 2018-03-03 DIAGNOSIS — E039 Hypothyroidism, unspecified: Secondary | ICD-10-CM

## 2018-03-03 DIAGNOSIS — Z800 Family history of malignant neoplasm of digestive organs: Secondary | ICD-10-CM

## 2018-03-03 LAB — LIPID PANEL
CHOL/HDL RATIO: 4
Cholesterol: 175 mg/dL (ref 0–200)
HDL: 46.5 mg/dL (ref >39.00)
LDL CALC: 101 mg/dL — AB (ref 0–99)
NonHDL: 128.88
TRIGLYCERIDES: 141 mg/dL (ref 0.0–149.0)
VLDL: 28.2 mg/dL (ref 0.0–40.0)

## 2018-03-03 LAB — COMPREHENSIVE METABOLIC PANEL
ALT: 13 U/L (ref 0–35)
AST: 18 U/L (ref 0–37)
Albumin: 4.1 g/dL (ref 3.5–5.2)
Alkaline Phosphatase: 55 U/L (ref 39–117)
BUN: 17 mg/dL (ref 6–23)
CALCIUM: 9.9 mg/dL (ref 8.4–10.5)
CHLORIDE: 106 meq/L (ref 96–112)
CO2: 27 meq/L (ref 19–32)
CREATININE: 1.15 mg/dL (ref 0.40–1.20)
GFR: 49.5 mL/min — ABNORMAL LOW (ref >60.00)
Glucose, Bld: 105 mg/dL — ABNORMAL HIGH (ref 70–99)
POTASSIUM: 4.4 meq/L (ref 3.5–5.1)
SODIUM: 140 meq/L (ref 135–145)
Total Bilirubin: 0.5 mg/dL (ref 0.2–1.2)
Total Protein: 7.3 g/dL (ref 6.0–8.3)

## 2018-03-03 LAB — TSH: TSH: 0.72 u[IU]/mL (ref 0.35–4.50)

## 2018-03-03 NOTE — Progress Notes (Signed)
Patient ID: Ann Perkins, female   DOB: 04/15/1946, 72 y.o.   MRN: [id number]   Subjective:    Patient ID: Ann Perkins, female    DOB: 12-14-1945, 72 y.o.   MRN: [id number]  HPI  Patient here for a scheduled follow up.  She reports she is doing relatively well.  She recently fractured her left wrist.  Doing well.  Has f/u planned 03/14/18.  Discussed bone density.  She declines.  Trying to stay active.  No chest pain.  No sob.  No acid reflux.  No abdominal pain.  Bowels moving.  Blood pressures from outside readings reviewed.  Blood pressures averaging 100-120/60s.  She feels she is doing well overall.     Past Medical History:  Diagnosis Date  . Allergy   . GERD (gastroesophageal reflux disease)   . Hemorrhoids   . History of colon polyps   . Hypertension   . Thyroid disease    Past Surgical History:  Procedure Laterality Date  . COLONOSCOPY  2016  . MOUTH SURGERY     x 2  . TONSILLECTOMY     Family History  Problem Relation Age of Onset  . Hyperlipidemia Mother   . Heart disease Mother   . Diabetes Mother   . Stroke Father   . Colon cancer Brother        Half(Father Side)  . Colon cancer Sister        x 2 Half(Father Side)  . Colon cancer Cousin        Mother Side   . Esophageal cancer Neg Hx   . Rectal cancer Neg Hx   . Stomach cancer Neg Hx    Social History   Socioeconomic History  . Marital status: Single    Spouse name: Not on file  . Number of children: Not on file  . Years of education: Not on file  . Highest education level: Not on file  Occupational History  . Occupation: Retired  Scientific laboratory technician  . Financial resource strain: Not on file  . Food insecurity:    Worry: Not on file    Inability: Not on file  . Transportation needs:    Medical: Not on file    Non-medical: Not on file  Tobacco Use  . Smoking status: Never Smoker  . Smokeless tobacco: Never Used  Substance and Sexual Activity  . Alcohol use: No    Alcohol/week: 0.0 standard drinks  .  Drug use: No  . Sexual activity: Not on file  Lifestyle  . Physical activity:    Days per week: Not on file    Minutes per session: Not on file  . Stress: Not on file  Relationships  . Social connections:    Talks on phone: Not on file    Gets together: Not on file    Attends religious service: Not on file    Active member of club or organization: Not on file    Attends meetings of clubs or organizations: Not on file    Relationship status: Not on file  Other Topics Concern  . Not on file  Social History Narrative  . Not on file    Outpatient Encounter Medications as of 03/03/2018  Medication Sig  . lisinopril (PRINIVIL,ZESTRIL) 5 MG tablet TAKE ONE TABLET BY MOUTH 3 TIMES DAILY  . sodium chloride (OCEAN) 0.65 % nasal spray Place 1 spray into the nose as needed for congestion.  Marland Kitchen SYNTHROID 75 MCG tablet TAKE ONE TABLET BY MOUTH  EVERY DAY   Facility-Administered Encounter Medications as of 03/03/2018  Medication  . 0.9 %  sodium chloride infusion    Review of Systems  Constitutional: Negative for appetite change and unexpected weight change.  HENT: Negative for congestion and sinus pressure.   Respiratory: Negative for cough, chest tightness and shortness of breath.   Cardiovascular: Negative for chest pain, palpitations and leg swelling.  Gastrointestinal: Negative for abdominal pain, diarrhea, nausea and vomiting.  Genitourinary: Negative for difficulty urinating and dysuria.  Musculoskeletal: Negative for joint swelling and myalgias.  Skin: Negative for color change and rash.  Neurological: Negative for dizziness, light-headedness and headaches.  Psychiatric/Behavioral: Negative for agitation and dysphoric mood.       Objective:     Blood pressure rechecked by me:  124/78  Physical Exam  Constitutional: She appears well-developed and well-nourished. No distress.  HENT:  Nose: Nose normal.  Mouth/Throat: Oropharynx is clear and moist.  Neck: Neck supple. No  thyromegaly present.  Cardiovascular: Normal rate and regular rhythm.  Pulmonary/Chest: Breath sounds normal. No respiratory distress. She has no wheezes.  Abdominal: Soft. Bowel sounds are normal. There is no tenderness.  Musculoskeletal: She exhibits no edema or tenderness.  Lymphadenopathy:    She has no cervical adenopathy.  Skin: No rash noted. No erythema.  Psychiatric: She has a normal mood and affect. Her behavior is normal.    BP 124/78   Pulse 87   Temp 97.6 F (36.4 C) (Oral)   Ht 4\' 9"  (1.448 m)   Wt 134 lb 9.6 oz (61.1 kg)   LMP 07/21/1991   SpO2 96%   BMI 29.13 kg/m  Wt Readings from Last 3 Encounters:  03/03/18 134 lb 9.6 oz (61.1 kg)  01/05/18 133 lb (60.3 kg)  12/22/17 133 lb (60.3 kg)     Lab Results  Component Value Date   WBC 6.6 10/20/2017   HGB 14.2 10/20/2017   HCT 41.6 10/20/2017   PLT 219.0 10/20/2017   GLUCOSE 105 (H) 03/03/2018   CHOL 175 03/03/2018   TRIG 141.0 03/03/2018   HDL 46.50 03/03/2018   LDLCALC 101 (H) 03/03/2018   ALT 13 03/03/2018   AST 18 03/03/2018   NA 140 03/03/2018   K 4.4 03/03/2018   CL 106 03/03/2018   CREATININE 1.15 03/03/2018   BUN 17 03/03/2018   CO2 27 03/03/2018   TSH 0.72 03/03/2018       Assessment & Plan:   Problem List Items Addressed This Visit    Closed fracture of distal end of left radius    Seeing ortho.  Has f/u planned for 03/14/18.  Declines bone density.        Essential hypertension, benign    Blood pressures as outlined.   Continue same medication regimen.  Follow pressures.  Follow metabolic panel.         Family history of malignant neoplasm of gastrointestinal tract    Colonoscopy 12/2017.        Hypercholesterolemia - Primary    Low cholesterol diet and exercise.  Follow lipid panel.        Relevant Orders   Lipid panel (Completed)   Comprehensive metabolic panel (Completed)   Hypothyroidism    On thyroid replacement.  Follow tsh.       Relevant Orders   TSH  (Completed)       Einar Pheasant, MD

## 2018-03-06 ENCOUNTER — Encounter: Payer: Self-pay | Admitting: Internal Medicine

## 2018-03-06 NOTE — Assessment & Plan Note (Signed)
Blood pressures as outlined.  Continue same medication regimen.  Follow pressures.  Follow metabolic panel.  

## 2018-03-06 NOTE — Assessment & Plan Note (Signed)
On thyroid replacement.  Follow tsh.  

## 2018-03-06 NOTE — Assessment & Plan Note (Signed)
Low cholesterol diet and exercise.  Follow lipid panel.   

## 2018-03-06 NOTE — Assessment & Plan Note (Signed)
Seeing ortho.  Has f/u planned for 03/14/18.  Declines bone density.

## 2018-03-06 NOTE — Assessment & Plan Note (Signed)
Colonoscopy 12/2017.   

## 2018-03-14 DIAGNOSIS — G5602 Carpal tunnel syndrome, left upper limb: Secondary | ICD-10-CM | POA: Diagnosis not present

## 2018-03-14 DIAGNOSIS — M25532 Pain in left wrist: Secondary | ICD-10-CM | POA: Diagnosis not present

## 2018-04-01 DIAGNOSIS — G5602 Carpal tunnel syndrome, left upper limb: Secondary | ICD-10-CM | POA: Diagnosis not present

## 2018-04-15 DIAGNOSIS — G5602 Carpal tunnel syndrome, left upper limb: Secondary | ICD-10-CM | POA: Diagnosis not present

## 2018-04-25 ENCOUNTER — Ambulatory Visit: Payer: Self-pay | Admitting: Internal Medicine

## 2018-04-25 NOTE — Telephone Encounter (Signed)
Please advise 

## 2018-04-25 NOTE — Telephone Encounter (Signed)
Yesterday bp was 130/80 and 126/81 and She is concerned the bottom numbers are too high     Patient took her BP at the drug store and she got a high warning- she wanted to make sure things were good. Patient is scheduled for within 2 weeks  - she is going to check when she is out during the next weeks and bring her readings for review.  Reason for Disposition . [8] Systolic BP  >= 403 OR Diastolic >= 80 AND [7] taking BP medications  Answer Assessment - Initial Assessment Questions 1. BLOOD PRESSURE: "What is the blood pressure?" "Did you take at least two measurements 5 minutes apart?"     130/80 and 126/81 2. ONSET: "When did you take your blood pressure?"     Yesterday at pharmacy 3. HOW: "How did you obtain the blood pressure?" (e.g., visiting nurse, automatic home BP monitor)     automatic cuff 4. HISTORY: "Do you have a history of high blood pressure?"     yes 5. MEDICATIONS: "Are you taking any medications for blood pressure?" "Have you missed any doses recently?"     Yes- lisinopril no missed doses 6. OTHER SYMPTOMS: "Do you have any symptoms?" (e.g., headache, chest pain, blurred vision, difficulty breathing, weakness)     No symptoms 7. PREGNANCY: "Is there any chance you are pregnant?" "When was your last menstrual period?"     n/a  Protocols used: HIGH BLOOD PRESSURE-A-AH

## 2018-04-26 ENCOUNTER — Other Ambulatory Visit: Payer: Self-pay | Admitting: Internal Medicine

## 2018-04-26 NOTE — Telephone Encounter (Signed)
These blood pressure readings are ok.  Is she having any problems.

## 2018-04-26 NOTE — Telephone Encounter (Signed)
Spoke with patient she is taking blood pressure medication on regular basis. No headaches , chest pain or shortness of breath.

## 2018-04-26 NOTE — Telephone Encounter (Addendum)
Patient advised of below, she will continue to monitor blood pressures.  She will let us know if blood pressures change. She will call to schedule appointment if anything changes with blood pressure.

## 2018-04-26 NOTE — Telephone Encounter (Signed)
The blood pressures recorded are ok.  Continue to monitor.  Can schedule appt if wants to f/u on her blood pressure.

## 2018-04-27 NOTE — Telephone Encounter (Signed)
Patient called in and stated she will be out of meds on 04/28/2018

## 2018-05-04 DIAGNOSIS — G5602 Carpal tunnel syndrome, left upper limb: Secondary | ICD-10-CM | POA: Diagnosis not present

## 2018-05-11 ENCOUNTER — Ambulatory Visit: Payer: Medicare HMO | Admitting: Family Medicine

## 2018-05-13 ENCOUNTER — Ambulatory Visit (INDEPENDENT_AMBULATORY_CARE_PROVIDER_SITE_OTHER): Payer: Medicare HMO

## 2018-05-13 DIAGNOSIS — Z23 Encounter for immunization: Secondary | ICD-10-CM

## 2018-05-24 DIAGNOSIS — H25013 Cortical age-related cataract, bilateral: Secondary | ICD-10-CM | POA: Diagnosis not present

## 2018-05-24 DIAGNOSIS — H52223 Regular astigmatism, bilateral: Secondary | ICD-10-CM | POA: Diagnosis not present

## 2018-05-24 DIAGNOSIS — H2513 Age-related nuclear cataract, bilateral: Secondary | ICD-10-CM | POA: Diagnosis not present

## 2018-05-24 DIAGNOSIS — H5213 Myopia, bilateral: Secondary | ICD-10-CM | POA: Diagnosis not present

## 2018-05-24 DIAGNOSIS — H524 Presbyopia: Secondary | ICD-10-CM | POA: Diagnosis not present

## 2018-06-22 ENCOUNTER — Other Ambulatory Visit: Payer: Self-pay | Admitting: Internal Medicine

## 2018-07-11 ENCOUNTER — Encounter: Payer: Self-pay | Admitting: Internal Medicine

## 2018-07-11 ENCOUNTER — Ambulatory Visit (INDEPENDENT_AMBULATORY_CARE_PROVIDER_SITE_OTHER): Payer: Medicare HMO | Admitting: Internal Medicine

## 2018-07-11 VITALS — BP 148/84 | HR 80 | Temp 97.7°F | Ht <= 58 in | Wt 129.4 lb

## 2018-07-11 DIAGNOSIS — Z0000 Encounter for general adult medical examination without abnormal findings: Secondary | ICD-10-CM | POA: Diagnosis not present

## 2018-07-11 DIAGNOSIS — Z800 Family history of malignant neoplasm of digestive organs: Secondary | ICD-10-CM

## 2018-07-11 DIAGNOSIS — E7800 Pure hypercholesterolemia, unspecified: Secondary | ICD-10-CM | POA: Diagnosis not present

## 2018-07-11 DIAGNOSIS — I10 Essential (primary) hypertension: Secondary | ICD-10-CM

## 2018-07-11 DIAGNOSIS — E039 Hypothyroidism, unspecified: Secondary | ICD-10-CM

## 2018-07-11 LAB — BASIC METABOLIC PANEL
BUN: 20 mg/dL (ref 6–23)
CO2: 28 mEq/L (ref 19–32)
Calcium: 9.9 mg/dL (ref 8.4–10.5)
Chloride: 105 mEq/L (ref 96–112)
Creatinine, Ser: 1.13 mg/dL (ref 0.40–1.20)
GFR: 50.46 mL/min — ABNORMAL LOW (ref >60.00)
Glucose, Bld: 97 mg/dL (ref 70–99)
Potassium: 4.5 mEq/L (ref 3.5–5.1)
Sodium: 140 mEq/L (ref 135–145)

## 2018-07-11 LAB — HEPATIC FUNCTION PANEL
ALT: 13 U/L (ref 0–35)
AST: 17 U/L (ref 0–37)
Albumin: 4.2 g/dL (ref 3.5–5.2)
Alkaline Phosphatase: 54 U/L (ref 39–117)
Bilirubin, Direct: 0.1 mg/dL (ref 0.0–0.3)
Total Bilirubin: 0.7 mg/dL (ref 0.2–1.2)
Total Protein: 7.6 g/dL (ref 6.0–8.3)

## 2018-07-11 LAB — LIPID PANEL
Cholesterol: 178 mg/dL (ref 0–200)
HDL: 45.8 mg/dL (ref >39.00)
LDL Cholesterol: 114 mg/dL — ABNORMAL HIGH (ref 0–99)
NonHDL: 131.81
Total CHOL/HDL Ratio: 4
Triglycerides: 90 mg/dL (ref 0.0–149.0)
VLDL: 18 mg/dL (ref 0.0–40.0)

## 2018-07-11 LAB — TSH: TSH: 1.08 u[IU]/mL (ref 0.35–4.50)

## 2018-07-11 NOTE — Patient Instructions (Signed)
nasacort nasal spray - 2 sprays each nostril one time per day.  Do this in the evening.   

## 2018-07-11 NOTE — Assessment & Plan Note (Signed)
Physical today 07/11/18.  Colonoscopy 12/2017.  Declines mammogram.

## 2018-07-11 NOTE — Progress Notes (Signed)
Patient ID: Ann Perkins, female   DOB: 1945/12/30, 72 y.o.   MRN: [id number]   Subjective:    Patient ID: Ann Perkins, female    DOB: 1945/09/20, 72 y.o.   MRN: [id number]  HPI  Patient here for her physical exam.  Is s/p CTS.  Still some numbness.  Seeing surgery.  Tries to stay active.  No chest pain.  No sob.  No acid reflux.  No abdominal pain.  Bowels moving.  No urine change.  Has been following her blood pressures.  Outside readings averaging 108-130/60-70s.  Colonoscopy 12/2017 - 60mm polyp, diverticulosis and internal hemorrhoid.  Recommended f/u in 5 years.     Past Medical History:  Diagnosis Date  . Allergy   . GERD (gastroesophageal reflux disease)   . Hemorrhoids   . History of colon polyps   . Hypertension   . Thyroid disease    Past Surgical History:  Procedure Laterality Date  . COLONOSCOPY  2016  . MOUTH SURGERY     x 2  . TONSILLECTOMY     Family History  Problem Relation Age of Onset  . Hyperlipidemia Mother   . Heart disease Mother   . Diabetes Mother   . Stroke Father   . Colon cancer Brother        Half(Father Side)  . Colon cancer Sister        x 2 Half(Father Side)  . Colon cancer Cousin        Mother Side   . Esophageal cancer Neg Hx   . Rectal cancer Neg Hx   . Stomach cancer Neg Hx    Social History   Socioeconomic History  . Marital status: Single    Spouse name: Not on file  . Number of children: Not on file  . Years of education: Not on file  . Highest education level: Not on file  Occupational History  . Occupation: Retired  Scientific laboratory technician  . Financial resource strain: Not on file  . Food insecurity:    Worry: Not on file    Inability: Not on file  . Transportation needs:    Medical: Not on file    Non-medical: Not on file  Tobacco Use  . Smoking status: Never Smoker  . Smokeless tobacco: Never Used  Substance and Sexual Activity  . Alcohol use: No    Alcohol/week: 0.0 standard drinks  . Drug use: No  . Sexual activity:  Not on file  Lifestyle  . Physical activity:    Days per week: Not on file    Minutes per session: Not on file  . Stress: Not on file  Relationships  . Social connections:    Talks on phone: Not on file    Gets together: Not on file    Attends religious service: Not on file    Active member of club or organization: Not on file    Attends meetings of clubs or organizations: Not on file    Relationship status: Not on file  Other Topics Concern  . Not on file  Social History Narrative  . Not on file    Outpatient Encounter Medications as of 07/11/2018  Medication Sig  . lisinopril (PRINIVIL,ZESTRIL) 5 MG tablet TAKE ONE TABLET 3 TIMES DAILY  . sodium chloride (OCEAN) 0.65 % nasal spray Place 1 spray into the nose as needed for congestion.  Marland Kitchen SYNTHROID 75 MCG tablet TAKE ONE TABLET BY MOUTH EVERY DAY   Facility-Administered Encounter Medications as of 07/11/2018  Medication  . 0.9 %  sodium chloride infusion    Review of Systems  Constitutional: Negative for appetite change and unexpected weight change.  HENT: Negative for congestion and sinus pressure.   Respiratory: Negative for cough, chest tightness and shortness of breath.   Cardiovascular: Negative for chest pain, palpitations and leg swelling.  Gastrointestinal: Negative for abdominal pain, diarrhea, nausea and vomiting.  Genitourinary: Negative for difficulty urinating and dysuria.  Musculoskeletal: Negative for joint swelling and myalgias.  Skin: Negative for color change and rash.  Neurological: Negative for dizziness, light-headedness and headaches.  Psychiatric/Behavioral: Negative for agitation and dysphoric mood.       Objective:    Physical Exam Constitutional:      Appearance: Normal appearance. She is well-developed and normal weight.  HENT:     Nose: Nose normal. No congestion.     Mouth/Throat:     Pharynx: No oropharyngeal exudate or posterior oropharyngeal erythema.  Eyes:     General: No  scleral icterus.       Right eye: No discharge.        Left eye: No discharge.  Neck:     Musculoskeletal: Neck supple. No muscular tenderness.     Thyroid: No thyromegaly.  Cardiovascular:     Rate and Rhythm: Normal rate and regular rhythm.  Pulmonary:     Effort: No tachypnea, accessory muscle usage or respiratory distress.     Breath sounds: Normal breath sounds. No decreased breath sounds or wheezing.  Chest:     Breasts:        Right: No inverted nipple, mass, nipple discharge or tenderness (no axillary adenopathy).        Left: No inverted nipple, mass, nipple discharge or tenderness (no axilarry adenopathy).  Abdominal:     General: Bowel sounds are normal.     Palpations: Abdomen is soft.     Tenderness: There is no abdominal tenderness.  Musculoskeletal:        General: No swelling or tenderness.  Lymphadenopathy:     Cervical: No cervical adenopathy.  Skin:    Findings: No erythema or rash.  Neurological:     Mental Status: She is alert and oriented to person, place, and time.  Psychiatric:        Mood and Affect: Mood normal.        Behavior: Behavior normal.     BP (!) 148/84   Pulse 80   Temp 97.7 F (36.5 C)   Ht 4\' 10"  (1.473 m)   Wt 129 lb 6.4 oz (58.7 kg)   LMP 07/21/1991   SpO2 98%   BMI 27.04 kg/m  Wt Readings from Last 3 Encounters:  07/11/18 129 lb 6.4 oz (58.7 kg)  03/03/18 134 lb 9.6 oz (61.1 kg)  01/05/18 133 lb (60.3 kg)     Lab Results  Component Value Date   WBC 6.6 10/20/2017   HGB 14.2 10/20/2017   HCT 41.6 10/20/2017   PLT 219.0 10/20/2017   GLUCOSE 97 07/11/2018   CHOL 178 07/11/2018   TRIG 90.0 07/11/2018   HDL 45.80 07/11/2018   LDLCALC 114 (H) 07/11/2018   ALT 13 07/11/2018   AST 17 07/11/2018   NA 140 07/11/2018   K 4.5 07/11/2018   CL 105 07/11/2018   CREATININE 1.13 07/11/2018   BUN 20 07/11/2018   CO2 28 07/11/2018   TSH 1.08 07/11/2018       Assessment & Plan:   Problem List Items Addressed This Visit  Essential hypertension, benign    Blood pressure as outlined.  On lisinopril.  Discussed elevation here in the office.  Outside checks under good control.  Desires to continue current medication regimen.  Follow pressures.  Follow metabolic panel.      Relevant Orders   Basic metabolic panel (Completed)   Family history of malignant neoplasm of gastrointestinal tract    Colonoscopy 12/2017.  Recommended f/u in 5 years.        Health care maintenance    Physical today 07/11/18.  Colonoscopy 12/2017.  Declines mammogram.        Hypercholesterolemia    Low cholesterol diet and exercise.  Desires not to start cholesterol medication.  Follow lipid panel.        Relevant Orders   Hepatic function panel (Completed)   Lipid panel (Completed)   Hypothyroidism    On thyroid replacement.  Follow tsh.        Relevant Orders   TSH (Completed)    Other Visit Diagnoses    Routine general medical examination at a health care facility    -  Primary       Einar Pheasant, MD

## 2018-07-17 ENCOUNTER — Encounter: Payer: Self-pay | Admitting: Internal Medicine

## 2018-07-17 NOTE — Assessment & Plan Note (Signed)
Low cholesterol diet and exercise.  Desires not to start cholesterol medication.  Follow lipid panel.

## 2018-07-17 NOTE — Assessment & Plan Note (Signed)
On thyroid replacement.  Follow tsh.  

## 2018-07-17 NOTE — Assessment & Plan Note (Signed)
Colonoscopy 12/2017.  Recommended f/u in 5 years.

## 2018-07-17 NOTE — Assessment & Plan Note (Signed)
Blood pressure as outlined.  On lisinopril.  Discussed elevation here in the office.  Outside checks under good control.  Desires to continue current medication regimen.  Follow pressures.  Follow metabolic panel.

## 2018-07-19 ENCOUNTER — Encounter: Payer: Self-pay | Admitting: *Deleted

## 2018-07-22 ENCOUNTER — Other Ambulatory Visit: Payer: Self-pay | Admitting: Internal Medicine

## 2018-07-27 ENCOUNTER — Other Ambulatory Visit: Payer: Self-pay | Admitting: Internal Medicine

## 2018-07-28 DIAGNOSIS — H20011 Primary iridocyclitis, right eye: Secondary | ICD-10-CM | POA: Diagnosis not present

## 2018-08-04 DIAGNOSIS — R69 Illness, unspecified: Secondary | ICD-10-CM | POA: Diagnosis not present

## 2018-08-12 ENCOUNTER — Ambulatory Visit (INDEPENDENT_AMBULATORY_CARE_PROVIDER_SITE_OTHER): Payer: Medicare HMO | Admitting: Family Medicine

## 2018-08-12 ENCOUNTER — Encounter: Payer: Self-pay | Admitting: Family Medicine

## 2018-08-12 VITALS — BP 140/82 | HR 92 | Temp 98.9°F | Resp 18 | Ht <= 58 in | Wt 131.4 lb

## 2018-08-12 DIAGNOSIS — J019 Acute sinusitis, unspecified: Secondary | ICD-10-CM

## 2018-08-12 MED ORDER — AMOXICILLIN 500 MG PO CAPS: 500.0000 mg | ORAL_CAPSULE | Freq: Two times a day (BID) | ORAL | 0 refills | Status: DC

## 2018-08-12 MED ORDER — TRIAMCINOLONE ACETONIDE 55 MCG/ACT NA AERO: 2.0000 | INHALATION_SPRAY | Freq: Every day | NASAL | 0 refills | Status: DC

## 2018-08-12 NOTE — Patient Instructions (Signed)
Eat yogurt daily while on antibiotic  Do saline nasal sprays and try using nasacort spray

## 2018-08-12 NOTE — Progress Notes (Signed)
Subjective:    Patient ID: Ann Perkins, female    DOB: 18-Apr-1946, 73 y.o.   MRN: [id number]  HPI   Patient presents to clinic complaining of sinus congestion, yellow nasal drainage, drainage down back of throat, ear fullness that has been present for a little over 1 week.  Patient states she also has a cough, sometimes will bring up yellow mucus as well.  Denies fever or chills.  Denies shortness of breath or wheezing.  Denies chest pain.  Denies nausea/vomiting/diarrhea.  Patient Active Problem List   Diagnosis Date Noted  . Acute pain of left wrist 11/09/2017  . Closed fracture of distal end of left radius 11/09/2017  . Health care maintenance 10/15/2014  . Hypercholesterolemia 02/05/2014  . Hypothyroidism 11/13/2012  . Essential hypertension, benign 10/19/2012  . History of colonic polyps 01/27/2012  . Hemorrhage of rectum and anus 11/23/2011  . Family history of malignant neoplasm of gastrointestinal tract 11/23/2011   Social History   Tobacco Use  . Smoking status: Never Smoker  . Smokeless tobacco: Never Used  Substance Use Topics  . Alcohol use: No    Alcohol/week: 0.0 standard drinks   Review of Systems  Constitutional: Negative for chills, fatigue and fever.  HENT:+congestion, ear pain, sinus pain, drainage and sore throat.   Eyes: Negative.   Respiratory: +cough. Negative for shortness of breath and wheezing.   Cardiovascular: Negative for chest pain, palpitations and leg swelling.  Gastrointestinal: Negative for abdominal pain, diarrhea, nausea and vomiting.  Genitourinary: Negative for dysuria, frequency and urgency.  Musculoskeletal: Negative for arthralgias and myalgias.  Skin: Negative for color change, pallor and rash.  Neurological: Negative for syncope, light-headedness and headaches.  Psychiatric/Behavioral: The patient is not nervous/anxious.       Objective:   Physical Exam Vitals signs and nursing note reviewed.  Constitutional:      General:  She is not in acute distress.    Appearance: She is not toxic-appearing.  HENT:     Nose:     Right Sinus: Maxillary sinus tenderness and frontal sinus tenderness present.     Left Sinus: Maxillary sinus tenderness and frontal sinus tenderness present.     Comments: Fullness bilateral TMs.  Positive thick yellow nasal drainage and postnasal drip. Eyes:     Extraocular Movements: Extraocular movements intact.     Conjunctiva/sclera: Conjunctivae normal.  Cardiovascular:     Rate and Rhythm: Normal rate and regular rhythm.  Pulmonary:     Effort: Pulmonary effort is normal. No respiratory distress.     Breath sounds: Normal breath sounds.  Musculoskeletal:     Right lower leg: No edema.     Left lower leg: No edema.  Skin:    General: Skin is warm and dry.  Neurological:     Mental Status: She is alert and oriented to person, place, and time.  Psychiatric:        Mood and Affect: Mood normal.        Behavior: Behavior normal.    Vitals:   08/12/18 1034  BP: 140/82  Pulse: 92  Resp: 18  Temp: 98.9 F (37.2 C)  SpO2: 96%      Assessment & Plan:   Acute sinusitis - patient will take amoxicillin twice daily for 10 days.  Advised to also do saline nasal rinses to help reduce congestion.  She will trial Nasacort nasal spray to see if this helps reduce congestion as well.  Advised to eat a yogurt daily while  on antibiotic.  She will rest, increase fluids and do good handwashing.  Lungs are clear on exam, suspect cough is related to postnasal drip from sinus infection.  Advised over-the-counter Robitussin syrup or Mucinex tablets are good choices to help reduce cough.  Advised patient to keep regularly scheduled follow-up with PCP as planned.  She will return to clinic sooner if any issues arise.

## 2018-09-13 DIAGNOSIS — M25532 Pain in left wrist: Secondary | ICD-10-CM | POA: Diagnosis not present

## 2018-09-13 DIAGNOSIS — G5602 Carpal tunnel syndrome, left upper limb: Secondary | ICD-10-CM | POA: Diagnosis not present

## 2018-09-23 DIAGNOSIS — J340 Abscess, furuncle and carbuncle of nose: Secondary | ICD-10-CM | POA: Diagnosis not present

## 2018-09-23 DIAGNOSIS — J301 Allergic rhinitis due to pollen: Secondary | ICD-10-CM | POA: Diagnosis not present

## 2018-10-17 ENCOUNTER — Other Ambulatory Visit: Payer: Self-pay | Admitting: Internal Medicine

## 2018-11-16 ENCOUNTER — Other Ambulatory Visit: Payer: Self-pay

## 2018-11-16 ENCOUNTER — Encounter: Payer: Self-pay | Admitting: Internal Medicine

## 2018-11-16 ENCOUNTER — Ambulatory Visit (INDEPENDENT_AMBULATORY_CARE_PROVIDER_SITE_OTHER): Payer: Medicare HMO | Admitting: Internal Medicine

## 2018-11-16 DIAGNOSIS — E039 Hypothyroidism, unspecified: Secondary | ICD-10-CM

## 2018-11-16 DIAGNOSIS — E7800 Pure hypercholesterolemia, unspecified: Secondary | ICD-10-CM | POA: Diagnosis not present

## 2018-11-16 DIAGNOSIS — I10 Essential (primary) hypertension: Secondary | ICD-10-CM

## 2018-11-16 DIAGNOSIS — Z9109 Other allergy status, other than to drugs and biological substances: Secondary | ICD-10-CM | POA: Diagnosis not present

## 2018-11-16 NOTE — Assessment & Plan Note (Signed)
Discussed nasacort as outlined.  Follow.

## 2018-11-16 NOTE — Assessment & Plan Note (Signed)
Outside blood pressure readings as outlined.  Continue same medication.  Follow pressures.  Follow metabolic panel.

## 2018-11-16 NOTE — Assessment & Plan Note (Signed)
Low cholesterol diet and exercise.  She declines to start cholesterol medication.  Follow lipid panel.

## 2018-11-16 NOTE — Assessment & Plan Note (Signed)
On thyroid replacement.  Follow tsh.  

## 2018-11-16 NOTE — Progress Notes (Signed)
Patient ID: Ann Perkins, female   DOB: 1945/11/07, 73 y.o.   MRN: [id number] Virtual Visit via Telephone Note  This visit type was conducted due to national recommendations for restrictions regarding the COVID-19 pandemic (e.g. social distancing).  This format is felt to be most appropriate for this patient at this time.  All issues noted in this document were discussed and addressed.  No physical exam was performed (except for noted visual exam findings with Video Visits).   I connected with [facility] on 11/16/18 at  9:00 AM EDT by telephone and verified that I am speaking with the correct person using two identifiers. Location patient: home Location provider: work Persons participating in the telephone visit: patient, provider  I discussed the limitations, risks, security and privacy concerns of performing an evaluation and management service by telephone and the availability of in person appointments.  The patient expressed understanding and agreed to proceed.   Reason for visit: scheduled follow up.   HPI: She reports she is doing relatively well.  Trying to stay active.  Recently had CTS.  No numbness now.  Still some stiffness in her wrist and clicking of her fingers.  She has seen ortho.  They are following.  States her blood pressures have been doing well.  Last couple of checks:  118/73, 106/62.  Discussed previous cholesterol results.  She desires not to take cholesterol medication.  No chest pain.  No sob.  No acid reflux.  No abdominal pain.  Bowels moving.  Some allergies.  Concern flonase may dry her out.  Discussed trial of nasacort.  No significant sinus infection.  No fever.  No known COVID exposure.    ROS: See pertinent positives and negatives per HPI.  Past Medical History:  Diagnosis Date  . Allergy   . GERD (gastroesophageal reflux disease)   . Hemorrhoids   . History of colon polyps   . Hypertension   . Thyroid disease     Past Surgical History:  Procedure  Laterality Date  . COLONOSCOPY  2016  . MOUTH SURGERY     x 2  . TONSILLECTOMY      Family History  Problem Relation Age of Onset  . Hyperlipidemia Mother   . Heart disease Mother   . Diabetes Mother   . Stroke Father   . Colon cancer Brother        Half(Father Side)  . Colon cancer Sister        x 2 Half(Father Side)  . Colon cancer Cousin        Mother Side   . Esophageal cancer Neg Hx   . Rectal cancer Neg Hx   . Stomach cancer Neg Hx     SOCIAL HX: reviewed.    Current Outpatient Medications:  .  lisinopril (PRINIVIL,ZESTRIL) 5 MG tablet, TAKE ONE TABLET 3 TIMES DAILY, Disp: 90 tablet, Rfl: 1 .  sodium chloride (OCEAN) 0.65 % nasal spray, Place 1 spray into the nose as needed for congestion., Disp: , Rfl:  .  SYNTHROID 75 MCG tablet, TAKE ONE TABLET EVERY DAY, Disp: 30 tablet, Rfl: 10  Current Facility-Administered Medications:  .  0.9 %  sodium chloride infusion, 500 mL, Intravenous, Once, Pyrtle, Lajuan Lines, MD  EXAM:  GENERAL: alert.  Sounds to be in no acute distress.  Answering questions appropriately.    PSYCH/NEURO: pleasant and cooperative, no obvious depression or anxiety, speech and thought processing grossly intact  ASSESSMENT AND PLAN:  Discussed the following assessment and  plan:  Essential hypertension, benign  Hypercholesterolemia  Hypothyroidism, unspecified type  Environmental allergies  Essential hypertension, benign Outside blood pressure readings as outlined.  Continue same medication.  Follow pressures.  Follow metabolic panel.   Hypercholesterolemia Low cholesterol diet and exercise.  She declines to start cholesterol medication.  Follow lipid panel.   Hypothyroidism On thyroid replacement.  Follow tsh.   Environmental allergies Discussed nasacort as outlined.  Follow.      I discussed the assessment and treatment plan with the patient. The patient was provided an opportunity to ask questions and all were answered. The patient  agreed with the plan and demonstrated an understanding of the instructions.   The patient was advised to call back or seek an in-person evaluation if the symptoms worsen or if the condition fails to improve as anticipated.  I provided 15 minutes of non-face-to-face time during this encounter.   Einar Pheasant, MD

## 2018-11-18 ENCOUNTER — Other Ambulatory Visit: Payer: Self-pay | Admitting: Internal Medicine

## 2019-02-13 ENCOUNTER — Other Ambulatory Visit: Payer: Self-pay | Admitting: Internal Medicine

## 2019-03-21 ENCOUNTER — Other Ambulatory Visit: Payer: Self-pay | Admitting: Internal Medicine

## 2019-04-13 DIAGNOSIS — R69 Illness, unspecified: Secondary | ICD-10-CM | POA: Diagnosis not present

## 2019-05-03 ENCOUNTER — Ambulatory Visit: Payer: Medicare HMO

## 2019-05-05 ENCOUNTER — Other Ambulatory Visit: Payer: Self-pay

## 2019-05-05 ENCOUNTER — Ambulatory Visit (INDEPENDENT_AMBULATORY_CARE_PROVIDER_SITE_OTHER): Payer: Medicare HMO

## 2019-05-05 DIAGNOSIS — Z23 Encounter for immunization: Secondary | ICD-10-CM

## 2019-05-19 ENCOUNTER — Other Ambulatory Visit: Payer: Self-pay | Admitting: Internal Medicine

## 2019-05-25 DIAGNOSIS — K08404 Partial loss of teeth, unspecified cause, class IV: Secondary | ICD-10-CM | POA: Diagnosis not present

## 2019-05-25 DIAGNOSIS — R69 Illness, unspecified: Secondary | ICD-10-CM | POA: Diagnosis not present

## 2019-06-23 ENCOUNTER — Other Ambulatory Visit: Payer: Self-pay | Admitting: Internal Medicine

## 2019-06-26 NOTE — Telephone Encounter (Signed)
Pt called to check on this refill, she stated she has 3 pills left .  Please advise [phone]

## 2019-08-14 ENCOUNTER — Other Ambulatory Visit: Payer: Self-pay | Admitting: Internal Medicine

## 2019-09-13 ENCOUNTER — Other Ambulatory Visit: Payer: Self-pay | Admitting: Internal Medicine

## 2019-09-17 ENCOUNTER — Other Ambulatory Visit: Payer: Self-pay

## 2019-09-17 ENCOUNTER — Encounter: Payer: Self-pay | Admitting: Emergency Medicine

## 2019-09-17 ENCOUNTER — Emergency Department: Payer: Medicare HMO

## 2019-09-17 ENCOUNTER — Emergency Department
Admission: EM | Admit: 2019-09-17 | Discharge: 2019-09-17 | Disposition: A | Discharge status: Home / Self Care | Payer: Medicare HMO | Attending: Emergency Medicine | Admitting: Emergency Medicine

## 2019-09-17 DIAGNOSIS — S0990XA Unspecified injury of head, initial encounter: Secondary | ICD-10-CM | POA: Diagnosis not present

## 2019-09-17 DIAGNOSIS — S20211A Contusion of right front wall of thorax, initial encounter: Secondary | ICD-10-CM | POA: Insufficient documentation

## 2019-09-17 DIAGNOSIS — Z23 Encounter for immunization: Secondary | ICD-10-CM | POA: Insufficient documentation

## 2019-09-17 DIAGNOSIS — R000 Tachycardia, unspecified: Secondary | ICD-10-CM | POA: Diagnosis not present

## 2019-09-17 DIAGNOSIS — Y9301 Activity, walking, marching and hiking: Secondary | ICD-10-CM | POA: Diagnosis not present

## 2019-09-17 DIAGNOSIS — S52614A Nondisplaced fracture of right ulna styloid process, initial encounter for closed fracture: Secondary | ICD-10-CM | POA: Diagnosis not present

## 2019-09-17 DIAGNOSIS — Y999 Unspecified external cause status: Secondary | ICD-10-CM | POA: Insufficient documentation

## 2019-09-17 DIAGNOSIS — Z79899 Other long term (current) drug therapy: Secondary | ICD-10-CM | POA: Insufficient documentation

## 2019-09-17 DIAGNOSIS — W19XXXA Unspecified fall, initial encounter: Secondary | ICD-10-CM | POA: Diagnosis not present

## 2019-09-17 DIAGNOSIS — E039 Hypothyroidism, unspecified: Secondary | ICD-10-CM | POA: Insufficient documentation

## 2019-09-17 DIAGNOSIS — R609 Edema, unspecified: Secondary | ICD-10-CM | POA: Diagnosis not present

## 2019-09-17 DIAGNOSIS — S52591A Other fractures of lower end of right radius, initial encounter for closed fracture: Secondary | ICD-10-CM | POA: Diagnosis not present

## 2019-09-17 DIAGNOSIS — S6291XA Unspecified fracture of right wrist and hand, initial encounter for closed fracture: Secondary | ICD-10-CM | POA: Insufficient documentation

## 2019-09-17 DIAGNOSIS — R0781 Pleurodynia: Secondary | ICD-10-CM | POA: Diagnosis not present

## 2019-09-17 DIAGNOSIS — I10 Essential (primary) hypertension: Secondary | ICD-10-CM | POA: Diagnosis not present

## 2019-09-17 DIAGNOSIS — S62101A Fracture of unspecified carpal bone, right wrist, initial encounter for closed fracture: Secondary | ICD-10-CM

## 2019-09-17 DIAGNOSIS — S199XXA Unspecified injury of neck, initial encounter: Secondary | ICD-10-CM | POA: Diagnosis not present

## 2019-09-17 DIAGNOSIS — Y92511 Restaurant or cafe as the place of occurrence of the external cause: Secondary | ICD-10-CM | POA: Insufficient documentation

## 2019-09-17 DIAGNOSIS — W0110XA Fall on same level from slipping, tripping and stumbling with subsequent striking against unspecified object, initial encounter: Secondary | ICD-10-CM | POA: Insufficient documentation

## 2019-09-17 DIAGNOSIS — M25531 Pain in right wrist: Secondary | ICD-10-CM | POA: Diagnosis not present

## 2019-09-17 MED ORDER — HYDROCODONE-ACETAMINOPHEN 5-325 MG PO TABS: 1.0000 | ORAL_TABLET | ORAL | 0 refills | Status: DC | PRN

## 2019-09-17 MED ORDER — TETANUS-DIPHTH-ACELL PERTUSSIS 5-2.5-18.5 LF-MCG/0.5 IM SUSP
0.5000 mL | Freq: Once | INTRAMUSCULAR | Status: AC
  Administered 2019-09-17: 15:00:00 0.5 mL via INTRAMUSCULAR
  Filled 2019-09-17: qty 0.5

## 2019-09-17 NOTE — ED Notes (Signed)
Sister Sherilyn Banker called at this time and updated- requests call when pt is to be discharged. Confirmed number in the system.

## 2019-09-17 NOTE — ED Provider Notes (Signed)
[facility] Emergency Department Provider Note  ____________________________________________  Time seen: Approximately 1:18 PM  I have reviewed the triage vital signs and the nursing notes.   HISTORY  Chief Complaint Fall and Wrist Pain    HPI Ann Perkins is a 74 y.o. female who presents the emergency department complaining of injury to the right forehead, right ribs, right wrist/hand after mechanical fall.  Patient states that she was walking into [employer] when her foot caught an exposed piece of metal.  This caused patient to fall onto her right side.  She tried to catch herself with extended hands.  Patient did hit her head but did not lose consciousness.  She denies any headache, visual changes, neck pain.  Patient is complaining of very mild right rib pain but no shortness of breath.  No substernal chest pain.  No abdominal pain.  Patient's main concern/complaint is right wrist injury.  Patient does have a history of a fracture previously to this wrist.  She states that she does have typical mild deformity along the medial aspect over the distal ulna, but is experiencing pain, edema, deformity along the lateral and dorsal aspect of the wrist.  Patient still has good range of motion to the digits.  She denies any back pain, bilateral hip pain, knee or lower extremity pain.         Past Medical History:  Diagnosis Date  . Allergy   . GERD (gastroesophageal reflux disease)   . Hemorrhoids   . History of colon polyps   . Hypertension   . Thyroid disease     Patient Active Problem List   Diagnosis Date Noted  . Environmental allergies 11/16/2018  . Acute pain of left wrist 11/09/2017  . Closed fracture of distal end of left radius 11/09/2017  . Health care maintenance 10/15/2014  . Hypercholesterolemia 02/05/2014  . Hypothyroidism 11/13/2012  . Essential hypertension, benign 10/19/2012  . History of colonic polyps 01/27/2012  . Hemorrhage of  rectum and anus 11/23/2011  . Family history of malignant neoplasm of gastrointestinal tract 11/23/2011    Past Surgical History:  Procedure Laterality Date  . COLONOSCOPY  2016  . MOUTH SURGERY     x 2  . TONSILLECTOMY      Prior to Admission medications   Medication Sig Start Date End Date Taking? Authorizing Provider  HYDROcodone-acetaminophen (NORCO/VICODIN) 5-325 MG tablet Take 1 tablet by mouth every 4 (four) hours as needed for moderate pain. 09/17/19   Shannyn Jankowiak, Charline Bills, PA-C  lisinopril (ZESTRIL) 5 MG tablet TAKE ONE TABLET 3 TIMES DAILY 09/13/19   Einar Pheasant, MD  sodium chloride (OCEAN) 0.65 % nasal spray Place 1 spray into the nose as needed for congestion.    [provider]  SYNTHROID 75 MCG tablet TAKE ONE TABLET BY MOUTH EVERY DAY 06/26/19   Einar Pheasant, MD    Allergies Erythromycin  Family History  Problem Relation Age of Onset  . Hyperlipidemia Mother   . Heart disease Mother   . Diabetes Mother   . Stroke Father   . Colon cancer Brother        Half(Father Side)  . Colon cancer Sister        x 2 Half(Father Side)  . Colon cancer Cousin        Mother Side   . Esophageal cancer Neg Hx   . Rectal cancer Neg Hx   . Stomach cancer Neg Hx     Social History Social History  Tobacco Use  . Smoking status: Never Smoker  . Smokeless tobacco: Never Used  Substance Use Topics  . Alcohol use: No    Alcohol/week: 0.0 standard drinks  . Drug use: No     Review of Systems  Constitutional: No fever/chills.  Patient hit her head but no loss of consciousness. Eyes: No visual changes. No discharge ENT: No upper respiratory complaints. Cardiovascular: no chest pain. Respiratory: no cough. No SOB. Gastrointestinal: No abdominal pain.  No nausea, no vomiting.   Musculoskeletal: Positive for right rib pain, right wrist/hand pain Skin: Negative for rash, abrasions, lacerations, ecchymosis. Neurological: Negative for headaches, focal weakness  or numbness. 10-point ROS otherwise negative.  ____________________________________________   PHYSICAL EXAM:  VITAL SIGNS: ED Triage Vitals  Enc Vitals Group     BP 09/17/19 1312 (!) 190/105     Pulse Rate 09/17/19 1312 (!) 111     Resp 09/17/19 1312 16     Temp 09/17/19 1312 98 F (36.7 C)     Temp Source 09/17/19 1312 Oral     SpO2 09/17/19 1312 99 %     Weight 09/17/19 1310 138 lb (62.6 kg)     Height 09/17/19 1310 4\' 11"  (1.499 m)     Head Circumference --      Peak Flow --      Pain Score 09/17/19 1310 8     Pain Loc --      Pain Edu? --      Excl. in [place]? --      Constitutional: Alert and oriented. Well appearing and in no acute distress. Eyes: Conjunctivae are normal. PERRL. EOMI. Head: Patient with hematoma, superficial abrasion noted to the right forehead.  No other gross signs of trauma to the head or face.  Patient is very tender to palpation over the hematoma but no crepitus or palpable findings other than soft tissue edema.  No tenderness to palpation of the remaining osseous structures of the skull and face.  No battle signs, raccoon eyes, serosanguineous fluid drainage from the ears or nares. ENT:      Ears:       Nose: No congestion/rhinnorhea.      Mouth/Throat: Mucous membranes are moist.  Neck: No stridor.  No cervical spine tenderness to palpation.  Cardiovascular: Normal rate, regular rhythm. Normal S1 and S2.  Good peripheral circulation. Respiratory: Normal respiratory effort without tachypnea or retractions. Lungs CTAB. Good air entry to the bases with no decreased or absent breath sounds. Gastrointestinal: Bowel sounds 4 quadrants. Soft and nontender to palpation. No guarding or rigidity. No palpable masses. No distention. No CVA tenderness. Musculoskeletal: Full range of motion to all extremities. No gross deformities appreciated.  Visualization of the ribs reveals equal chest rise and fall.  No paradoxical chest wall movement.  No abrasions,  lacerations.  No significant ecchymosis.  Patient with no significant tenderness along the right ribs.  No palpable abnormality or crepitus.  No subcutaneous emphysema.  Visualization of the right wrist reveals deformity, edema noted primarily over the lateral and dorsal aspect of the wrist.  Patient still has good range of motion to all 5 digits but no range of motion is performed with the right wrist.  Examination of the right shoulder, right elbow is unremarkable.  Palpation along the wrist reveals palpable abnormality.  No extension of palpable abnormality into the hand.  Wrist is grossly tender to palpation both medial, lateral, anterior and dorsal aspects.  Radial pulse intact.  Capillary refill intact  all digits.  Sensation intact all digits. Neurologic:  Normal speech and language. No gross focal neurologic deficits are appreciated.  Skin:  Skin is warm, dry and intact. No rash noted. Psychiatric: Mood and affect are normal. Speech and behavior are normal. Patient exhibits appropriate insight and judgement.   ____________________________________________   LABS (all labs ordered are listed, but only abnormal results are displayed)  Labs Reviewed - No data to display ____________________________________________  EKG   ____________________________________________  RADIOLOGY I personally viewed and evaluated these images as part of my medical decision making, as well as reviewing the written report by the radiologist.  DG Ribs Unilateral W/Chest Right  Result Date: 09/17/2019 CLINICAL DATA:  Right rib pain. EXAM: RIGHT RIBS AND CHEST - 3+ VIEW COMPARISON:  None. FINDINGS: No fracture or other bone lesions are seen involving the ribs. There is no evidence of pneumothorax or pleural effusion. Both lungs are clear. Heart size and mediastinal contours are within normal limits. IMPRESSION: Negative. Electronically Signed   By: Kathreen Devoid   On: 09/17/2019 14:26   DG Wrist Complete  Right  Result Date: 09/17/2019 CLINICAL DATA:  Status post fall, right wrist pain EXAM: RIGHT WRIST - COMPLETE 3+ VIEW COMPARISON:  None. FINDINGS: Generalized osteopenia. Comminuted and impacted distal radial metaphysis fracture extending to the articular surface of the radiocarpal joint. Nondisplaced fracture of the ulnar styloid process. Mild osteoarthritis of the first [facility] joint, first MCP joint and first IP joint. Soft tissue swelling around the right wrist. IMPRESSION: 1. Comminuted and impacted distal radial metaphysis fracture extending to the articular surface of the radiocarpal joint. 2. Nondisplaced fracture of the ulnar styloid process. Electronically Signed   By: Kathreen Devoid   On: 09/17/2019 14:28   CT Head Wo Contrast  Result Date: 09/17/2019 CLINICAL DATA:  Poly trauma head and C-spine injury. EXAM: CT HEAD WITHOUT CONTRAST CT CERVICAL SPINE WITHOUT CONTRAST TECHNIQUE: Multidetector CT imaging of the head and cervical spine was performed following the standard protocol without intravenous contrast. Multiplanar CT image reconstructions of the cervical spine were also generated. COMPARISON:  None FINDINGS: CT HEAD FINDINGS Brain: No evidence of acute infarction, hemorrhage, hydrocephalus, extra-axial collection or mass lesion/mass effect. Signs of chronic microvascular ischemic change and atrophy. Vascular: No hyperdense vessel or unexpected calcification. Skull: Normal. Negative for fracture or focal lesion. Sinuses/Orbits: No acute finding. Other: Signs of right frontal hematoma. Small hematoma without underlying fracture. CT CERVICAL SPINE FINDINGS Alignment: Normal. Skull base and vertebrae: No acute fracture. No primary bone lesion or focal pathologic process. Soft tissues and spinal canal: No prevertebral fluid or swelling. No visible canal hematoma. Disc levels: Multilevel degenerative changes in the spine greatest at C5-6 and C6-7 with mild to moderate anterior osteophytes and  uncovertebral degenerative changes. Upper chest: Negative. Other: None IMPRESSION: 1. No CT evidence for acute intracranial process. 2. Signs of chronic microvascular ischemic change and atrophy. 3. Signs of right frontal hematoma without underlying fracture. 4. No fracture or malalignment in the cervical spine. Electronically Signed   By: Zetta Bills M.D.   On: 09/17/2019 14:36   CT Cervical Spine Wo Contrast  Result Date: 09/17/2019 CLINICAL DATA:  Poly trauma head and C-spine injury. EXAM: CT HEAD WITHOUT CONTRAST CT CERVICAL SPINE WITHOUT CONTRAST TECHNIQUE: Multidetector CT imaging of the head and cervical spine was performed following the standard protocol without intravenous contrast. Multiplanar CT image reconstructions of the cervical spine were also generated. COMPARISON:  None FINDINGS: CT HEAD FINDINGS Brain: No evidence  of acute infarction, hemorrhage, hydrocephalus, extra-axial collection or mass lesion/mass effect. Signs of chronic microvascular ischemic change and atrophy. Vascular: No hyperdense vessel or unexpected calcification. Skull: Normal. Negative for fracture or focal lesion. Sinuses/Orbits: No acute finding. Other: Signs of right frontal hematoma. Small hematoma without underlying fracture. CT CERVICAL SPINE FINDINGS Alignment: Normal. Skull base and vertebrae: No acute fracture. No primary bone lesion or focal pathologic process. Soft tissues and spinal canal: No prevertebral fluid or swelling. No visible canal hematoma. Disc levels: Multilevel degenerative changes in the spine greatest at C5-6 and C6-7 with mild to moderate anterior osteophytes and uncovertebral degenerative changes. Upper chest: Negative. Other: None IMPRESSION: 1. No CT evidence for acute intracranial process. 2. Signs of chronic microvascular ischemic change and atrophy. 3. Signs of right frontal hematoma without underlying fracture. 4. No fracture or malalignment in the cervical spine. Electronically Signed    By: Zetta Bills M.D.   On: 09/17/2019 14:36    ____________________________________________    PROCEDURES  Procedure(s) performed:    .Splint Application  Date/Time: 09/17/2019 2:53 PM Performed by: Darletta Moll, PA-C Authorized by: Darletta Moll, PA-C   Consent:    Consent obtained:  Verbal   Consent given by:  Patient   Risks discussed:  Pain and swelling Pre-procedure details:    Sensation:  Normal Procedure details:    Laterality:  Right   Location:  Wrist   Wrist:  R wrist   Splint type:  Volar short arm   Supplies:  Cotton padding, Ortho-Glass and elastic bandage Post-procedure details:    Pain:  Improved   Sensation:  Normal   Patient tolerance of procedure:  Tolerated well, no immediate complications      Medications  Tdap (BOOSTRIX) injection 0.5 mL (has no administration in time range)     ____________________________________________   INITIAL IMPRESSION / ASSESSMENT AND PLAN / ED COURSE  Pertinent labs & imaging results that were available during my care of the patient were reviewed by me and considered in my medical decision making (see chart for details).  Review of the [place] CSRS was performed in accordance of the [place] prior to dispensing any controlled drugs.           Patient's diagnosis is consistent with fall resulting in right wrist fracture, hematoma of the forehead.  Patient presented to the emergency department after mechanical fall.  Patient's main complaint was right wrist pain/injury.  Patient did strike her head but did not lose consciousness.  Patient was also complaining of right rib pain with no substernal chest pain or shortness of breath.  CT scans of the head and neck are reassuring with no acute traumatic findings.  Chest x-ray is reassuring with no evidence of pneumothorax, rib fracture.  Patient does have fracture to the right wrist.  Patient will be splinted in the emergency department, referred to  orthopedics.  Patient has an orthopedic surgeon from previous left wrist fracture and states that she would prefer to follow-up with him in [place].. Patient is given ED precautions to return to the ED for any worsening or new symptoms.     ____________________________________________  FINAL CLINICAL IMPRESSION(S) / ED DIAGNOSES  Final diagnoses:  Fall, initial encounter  Minor head injury, initial encounter  Closed fracture of right wrist, initial encounter  Contusion of rib on right side, initial encounter      NEW MEDICATIONS STARTED DURING THIS VISIT:  ED Discharge Orders         Ordered  HYDROcodone-acetaminophen (NORCO/VICODIN) 5-325 MG tablet  Every 4 hours PRN     09/17/19 1503              This chart was dictated using voice recognition software/Dragon. Despite best efforts to proofread, errors can occur which can change the meaning. Any change was purely unintentional.    Darletta Moll, PA-C 09/17/19 1503    Harvest Dark, MD 09/17/19 1534

## 2019-09-17 NOTE — ED Triage Notes (Signed)
Pt to ED via [facility] [place] s/p fall. Pt tripped and hit her head and landed on her right wrist. No LOC, no blood thinners. Pt did start c/o chest pain after fall. Per EMS 12 lead WNL. Pt is c/o pain in her right wrist. Pt is A & O on arrival.

## 2019-09-17 NOTE — ED Provider Notes (Signed)
Patient's x-ray shows an impacted distal radius fracture that extends into the intra-articular surface.  CT scans are negative.  Patient strongly wishes to follow-up with her orthopedist in [place].  We have placed in a sugar tong splint.  I reevaluated patient after the splint she remains neurovascularly intact.   Harvest Dark, MD 09/17/19 1531

## 2019-09-17 NOTE — ED Notes (Signed)
ED tech applying splint

## 2019-09-18 ENCOUNTER — Telehealth: Payer: Self-pay | Admitting: Internal Medicine

## 2019-09-18 DIAGNOSIS — S52501A Unspecified fracture of the lower end of right radius, initial encounter for closed fracture: Secondary | ICD-10-CM | POA: Diagnosis not present

## 2019-09-18 DIAGNOSIS — M25531 Pain in right wrist: Secondary | ICD-10-CM | POA: Diagnosis not present

## 2019-09-18 NOTE — Telephone Encounter (Signed)
Pt wants to know if she should take another blood pressure pill every day 210/120 yesterday at ER. Pt fell yesterday and broke her wrist and hit her head. Pt is concerned about blood pressure. Please call pt asap to let her know.

## 2019-09-18 NOTE — Telephone Encounter (Signed)
Pt returned call and would like a call back.   °

## 2019-09-18 NOTE — Telephone Encounter (Signed)
LMTCB

## 2019-09-18 NOTE — Telephone Encounter (Signed)
Called patient back. Phone was busy

## 2019-09-21 NOTE — Telephone Encounter (Signed)
Confirmed pt doing ok. Patient stated she is not having any acute symptoms. Does not have a bp cuff to recheck. Was evaluated by ortho since ER visit. BP ok. Has surgery tomorrow with Dr Amedeo Plenty. Will let us know if she needs anything.

## 2019-09-22 DIAGNOSIS — Y999 Unspecified external cause status: Secondary | ICD-10-CM | POA: Diagnosis not present

## 2019-09-22 DIAGNOSIS — S52571A Other intraarticular fracture of lower end of right radius, initial encounter for closed fracture: Secondary | ICD-10-CM | POA: Diagnosis not present

## 2019-10-09 DIAGNOSIS — M25531 Pain in right wrist: Secondary | ICD-10-CM | POA: Diagnosis not present

## 2019-10-09 DIAGNOSIS — S52501D Unspecified fracture of the lower end of right radius, subsequent encounter for closed fracture with routine healing: Secondary | ICD-10-CM | POA: Diagnosis not present

## 2019-10-09 DIAGNOSIS — Z4789 Encounter for other orthopedic aftercare: Secondary | ICD-10-CM | POA: Diagnosis not present

## 2019-10-19 DIAGNOSIS — S52501D Unspecified fracture of the lower end of right radius, subsequent encounter for closed fracture with routine healing: Secondary | ICD-10-CM | POA: Diagnosis not present

## 2019-10-19 DIAGNOSIS — M25531 Pain in right wrist: Secondary | ICD-10-CM | POA: Diagnosis not present

## 2019-10-19 DIAGNOSIS — Z4789 Encounter for other orthopedic aftercare: Secondary | ICD-10-CM | POA: Diagnosis not present

## 2019-10-26 DIAGNOSIS — M25531 Pain in right wrist: Secondary | ICD-10-CM | POA: Diagnosis not present

## 2019-11-02 DIAGNOSIS — M25531 Pain in right wrist: Secondary | ICD-10-CM | POA: Diagnosis not present

## 2019-11-09 DIAGNOSIS — M25531 Pain in right wrist: Secondary | ICD-10-CM | POA: Diagnosis not present

## 2019-11-16 DIAGNOSIS — M65341 Trigger finger, right ring finger: Secondary | ICD-10-CM | POA: Diagnosis not present

## 2019-11-16 DIAGNOSIS — M25531 Pain in right wrist: Secondary | ICD-10-CM | POA: Diagnosis not present

## 2019-11-16 DIAGNOSIS — Z4789 Encounter for other orthopedic aftercare: Secondary | ICD-10-CM | POA: Diagnosis not present

## 2019-11-16 DIAGNOSIS — S52501D Unspecified fracture of the lower end of right radius, subsequent encounter for closed fracture with routine healing: Secondary | ICD-10-CM | POA: Diagnosis not present

## 2019-12-11 ENCOUNTER — Other Ambulatory Visit: Payer: Self-pay | Admitting: Internal Medicine

## 2019-12-13 DIAGNOSIS — J019 Acute sinusitis, unspecified: Secondary | ICD-10-CM | POA: Diagnosis not present

## 2019-12-13 DIAGNOSIS — I10 Essential (primary) hypertension: Secondary | ICD-10-CM | POA: Diagnosis not present

## 2019-12-13 DIAGNOSIS — Z03818 Encounter for observation for suspected exposure to other biological agents ruled out: Secondary | ICD-10-CM | POA: Diagnosis not present

## 2019-12-14 DIAGNOSIS — Z4789 Encounter for other orthopedic aftercare: Secondary | ICD-10-CM | POA: Diagnosis not present

## 2019-12-14 DIAGNOSIS — S52501D Unspecified fracture of the lower end of right radius, subsequent encounter for closed fracture with routine healing: Secondary | ICD-10-CM | POA: Diagnosis not present

## 2019-12-14 DIAGNOSIS — M25531 Pain in right wrist: Secondary | ICD-10-CM | POA: Diagnosis not present

## 2019-12-14 DIAGNOSIS — M65341 Trigger finger, right ring finger: Secondary | ICD-10-CM | POA: Diagnosis not present

## 2019-12-14 DIAGNOSIS — G5601 Carpal tunnel syndrome, right upper limb: Secondary | ICD-10-CM | POA: Diagnosis not present

## 2019-12-25 DIAGNOSIS — J301 Allergic rhinitis due to pollen: Secondary | ICD-10-CM | POA: Diagnosis not present

## 2019-12-25 DIAGNOSIS — K121 Other forms of stomatitis: Secondary | ICD-10-CM | POA: Diagnosis not present

## 2019-12-25 DIAGNOSIS — J019 Acute sinusitis, unspecified: Secondary | ICD-10-CM | POA: Diagnosis not present

## 2020-01-03 ENCOUNTER — Telehealth: Payer: Self-pay | Admitting: Internal Medicine

## 2020-01-03 NOTE — Telephone Encounter (Signed)
Leda Gauze from [facility] called wanted to know if bone density test had been ordered for pt.

## 2020-01-04 NOTE — Telephone Encounter (Signed)
Dexa not ordered. No CB number to reach them

## 2020-01-05 ENCOUNTER — Telehealth: Payer: Self-pay | Admitting: Internal Medicine

## 2020-01-05 MED ORDER — FLUCONAZOLE 150 MG PO TABS: 150.0000 mg | ORAL_TABLET | Freq: Every day | ORAL | 0 refills | Status: DC

## 2020-01-05 NOTE — Telephone Encounter (Signed)
Patient called on status of request from this morning. Patient was informed that a prescription was sent to [facility].

## 2020-01-05 NOTE — Telephone Encounter (Signed)
Pt was put on a antibiotic by ENT and now thinks she has a yeast infection and wanted to know what she needed to do

## 2020-01-05 NOTE — Telephone Encounter (Signed)
Fluconazole 1 tablet daily x 2 days  rx has been sent  to [facility]

## 2020-01-05 NOTE — Telephone Encounter (Signed)
Patient was told by ENT to get medication from PCP. Please advise.

## 2020-01-11 DIAGNOSIS — S52501D Unspecified fracture of the lower end of right radius, subsequent encounter for closed fracture with routine healing: Secondary | ICD-10-CM | POA: Diagnosis not present

## 2020-01-11 DIAGNOSIS — G5601 Carpal tunnel syndrome, right upper limb: Secondary | ICD-10-CM | POA: Diagnosis not present

## 2020-01-11 DIAGNOSIS — M65341 Trigger finger, right ring finger: Secondary | ICD-10-CM | POA: Diagnosis not present

## 2020-01-16 DIAGNOSIS — R69 Illness, unspecified: Secondary | ICD-10-CM | POA: Diagnosis not present

## 2020-02-13 ENCOUNTER — Other Ambulatory Visit: Payer: Self-pay

## 2020-02-13 ENCOUNTER — Telehealth: Payer: Self-pay | Admitting: Internal Medicine

## 2020-02-13 ENCOUNTER — Ambulatory Visit (INDEPENDENT_AMBULATORY_CARE_PROVIDER_SITE_OTHER): Payer: Medicare HMO | Admitting: Internal Medicine

## 2020-02-13 ENCOUNTER — Encounter: Payer: Self-pay | Admitting: Internal Medicine

## 2020-02-13 VITALS — BP 160/82 | HR 92 | Temp 98.4°F | Resp 16 | Ht <= 58 in | Wt 129.4 lb

## 2020-02-13 DIAGNOSIS — E039 Hypothyroidism, unspecified: Secondary | ICD-10-CM

## 2020-02-13 DIAGNOSIS — I10 Essential (primary) hypertension: Secondary | ICD-10-CM

## 2020-02-13 DIAGNOSIS — Z86010 Personal history of colonic polyps: Secondary | ICD-10-CM | POA: Diagnosis not present

## 2020-02-13 DIAGNOSIS — S52502D Unspecified fracture of the lower end of left radius, subsequent encounter for closed fracture with routine healing: Secondary | ICD-10-CM | POA: Diagnosis not present

## 2020-02-13 DIAGNOSIS — E7800 Pure hypercholesterolemia, unspecified: Secondary | ICD-10-CM

## 2020-02-13 LAB — LIPID PANEL
Cholesterol: 181 mg/dL (ref 0–200)
HDL: 45.2 mg/dL (ref >39.00)
LDL Cholesterol: 113 mg/dL — ABNORMAL HIGH (ref 0–99)
NonHDL: 135.63
Total CHOL/HDL Ratio: 4
Triglycerides: 114 mg/dL (ref 0.0–149.0)
VLDL: 22.8 mg/dL (ref 0.0–40.0)

## 2020-02-13 LAB — CBC WITH DIFFERENTIAL/PLATELET
Basophils Absolute: 0 10*3/uL (ref 0.0–0.1)
Basophils Relative: 0.4 % (ref 0.0–3.0)
Eosinophils Absolute: 0.1 10*3/uL (ref 0.0–0.7)
Eosinophils Relative: 1.3 % (ref 0.0–5.0)
HCT: 38.6 % (ref 36.0–46.0)
Hemoglobin: 13 g/dL (ref 12.0–15.0)
Lymphocytes Relative: 28.5 % (ref 12.0–46.0)
Lymphs Abs: 1.5 10*3/uL (ref 0.7–4.0)
MCHC: 33.8 g/dL (ref 30.0–36.0)
MCV: 90.6 fl (ref 78.0–100.0)
Monocytes Absolute: 0.4 10*3/uL (ref 0.1–1.0)
Monocytes Relative: 8.5 % (ref 3.0–12.0)
Neutro Abs: 3.2 10*3/uL (ref 1.4–7.7)
Neutrophils Relative %: 61.3 % (ref 43.0–77.0)
Platelets: 193 10*3/uL (ref 150.0–400.0)
RBC: 4.26 Mil/uL (ref 3.87–5.11)
RDW: 14 % (ref 11.5–15.5)
WBC: 5.2 10*3/uL (ref 4.0–10.5)

## 2020-02-13 LAB — COMPREHENSIVE METABOLIC PANEL
ALT: 11 U/L (ref 0–35)
AST: 17 U/L (ref 0–37)
Albumin: 4.1 g/dL (ref 3.5–5.2)
Alkaline Phosphatase: 59 U/L (ref 39–117)
BUN: 12 mg/dL (ref 6–23)
CO2: 26 mEq/L (ref 19–32)
Calcium: 9.3 mg/dL (ref 8.4–10.5)
Chloride: 106 mEq/L (ref 96–112)
Creatinine, Ser: 1.1 mg/dL (ref 0.40–1.20)
GFR: 48.76 mL/min — ABNORMAL LOW (ref >60.00)
Glucose, Bld: 98 mg/dL (ref 70–99)
Potassium: 3.9 mEq/L (ref 3.5–5.1)
Sodium: 140 mEq/L (ref 135–145)
Total Bilirubin: 0.7 mg/dL (ref 0.2–1.2)
Total Protein: 6.9 g/dL (ref 6.0–8.3)

## 2020-02-13 LAB — TSH: TSH: 0.28 u[IU]/mL — ABNORMAL LOW (ref 0.35–4.50)

## 2020-02-13 MED ORDER — LISINOPRIL 10 MG PO TABS: 10.0000 mg | ORAL_TABLET | Freq: Two times a day (BID) | ORAL | 2 refills | Status: DC

## 2020-02-13 NOTE — Progress Notes (Signed)
Patient ID: Ann Perkins, female   DOB: [birth year], 74 y.o.   MRN: [id number]   Subjective:    Patient ID: Ann Perkins, female    DOB: [birth year], 74 y.o.   MRN: [id number]  HPI This visit occurred during the SARS-CoV-2 public health emergency.  Safety protocols were in place, including screening questions prior to the visit, additional usage of staff PPE, and extensive cleaning of exam room while observing appropriate contact time as indicated for disinfecting solutions.  Patient here for a scheduled follow up. She reports she is doing relatively well.  Seeing Dr Amedeo Plenty for f/u wrist fracture (s/p ORIF) and CTS.  Is s/p injections for trigger finger and CTS.  Doing better.  Has f/u with him next week.  Blood pressure elevated.  States has been running a little high at [employer] office.  Taking her lisinopril.  Demonstrated how to properly apply her bp cuff.  No chest pain or sob.  She mows her yard.  No abdominal pain.  Bowels moving.  Discussed need for bone density.  She declines.  Declines mammogram.    Past Medical History:  Diagnosis Date   Allergy    GERD (gastroesophageal reflux disease)    Hemorrhoids    History of colon polyps    Hypertension    Thyroid disease    Past Surgical History:  Procedure Laterality Date   COLONOSCOPY  2016   MOUTH SURGERY     x 2   TONSILLECTOMY     Family History  Problem Relation Age of Onset   Hyperlipidemia Mother    Heart disease Mother    Diabetes Mother    Stroke Father    Colon cancer Brother        Half(Father Side)   Colon cancer Sister        x 2 Half(Father Side)   Colon cancer Cousin        Mother Side    Esophageal cancer Neg Hx    Rectal cancer Neg Hx    Stomach cancer Neg Hx    Social History   Socioeconomic History   Marital status: Single    Spouse name: Not on file   Number of children: Not on file   Years of education: Not on file   Highest education level: Not on file  Occupational History    Occupation: Retired  Tobacco Use   Smoking status: Never Smoker   Smokeless tobacco: Never Used  Scientific laboratory technician Use: Never used  Substance and Sexual Activity   Alcohol use: No    Alcohol/week: 0.0 standard drinks   Drug use: No   Sexual activity: Not on file  Other Topics Concern   Not on file  Social History Narrative   Not on file   Social Determinants of Health   Financial Resource Strain:    Difficulty of Paying Living Expenses:   Food Insecurity:    Worried About Charity fundraiser in the Last Year:    Arboriculturist in the Last Year:   Transportation Needs:    Film/video editor (Medical):    Lack of Transportation (Non-Medical):   Physical Activity:    Days of Exercise per Week:    Minutes of Exercise per Session:   Stress:    Feeling of Stress :   Social Connections:    Frequency of Communication with Friends and Family:    Frequency of Social Gatherings with Friends and Family:    Attends  Religious Services:    Active Member of Clubs or Organizations:    Attends Archivist Meetings:    Marital Status:     Outpatient Encounter Medications as of 02/13/2020  Medication Sig   lisinopril (ZESTRIL) 10 MG tablet Take 1 tablet (10 mg total) by mouth in the morning and at bedtime.   sodium chloride (OCEAN) 0.65 % nasal spray Place 1 spray into the nose as needed for congestion.   [DISCONTINUED] fluconazole (DIFLUCAN) 150 MG tablet Take 1 tablet (150 mg total) by mouth daily.   [DISCONTINUED] HYDROcodone-acetaminophen (NORCO/VICODIN) 5-325 MG tablet Take 1 tablet by mouth every 4 (four) hours as needed for moderate pain.   [DISCONTINUED] lisinopril (ZESTRIL) 5 MG tablet TAKE ONE TABLET 3 TIMES DAILY   [DISCONTINUED] SYNTHROID 75 MCG tablet TAKE ONE TABLET BY MOUTH EVERY DAY   Facility-Administered Encounter Medications as of 02/13/2020  Medication   0.9 %  sodium chloride infusion    Review of Systems    Constitutional: Negative for appetite change and unexpected weight change.  HENT: Negative for congestion and sinus pressure.   Respiratory: Negative for cough, chest tightness and shortness of breath.   Cardiovascular: Negative for chest pain, palpitations and leg swelling.  Gastrointestinal: Negative for abdominal pain, diarrhea, nausea and vomiting.  Genitourinary: Negative for difficulty urinating and dysuria.  Musculoskeletal: Negative for joint swelling and myalgias.  Skin: Negative for color change and rash.  Neurological: Negative for dizziness, light-headedness and headaches.  Psychiatric/Behavioral: Negative for agitation and dysphoric mood.       Objective:    Physical Exam Vitals reviewed.  Constitutional:      General: She is not in acute distress.    Appearance: Normal appearance.  HENT:     Head: Normocephalic and atraumatic.     Right Ear: External ear normal.     Left Ear: External ear normal.  Eyes:     General: No scleral icterus.       Right eye: No discharge.        Left eye: No discharge.     Conjunctiva/sclera: Conjunctivae normal.  Neck:     Thyroid: No thyromegaly.  Cardiovascular:     Rate and Rhythm: Normal rate and regular rhythm.  Pulmonary:     Effort: No respiratory distress.     Breath sounds: Normal breath sounds. No wheezing.  Abdominal:     General: Bowel sounds are normal.     Palpations: Abdomen is soft.     Tenderness: There is no abdominal tenderness.  Musculoskeletal:        General: No swelling or tenderness.     Cervical back: Neck supple. No tenderness.  Lymphadenopathy:     Cervical: No cervical adenopathy.  Skin:    Findings: No erythema or rash.  Neurological:     Mental Status: She is alert.  Psychiatric:        Mood and Affect: Mood normal.        Behavior: Behavior normal.     BP (!) 160/82    Pulse 92    Temp 98.4 F (36.9 C)    Resp 16    Ht 4\' 10"  (1.473 m)    Wt 129 lb 6.4 oz (58.7 kg)    LMP 07/21/1991     SpO2 98%    BMI 27.04 kg/m  Wt Readings from Last 3 Encounters:  02/13/20 129 lb 6.4 oz (58.7 kg)  09/17/19 138 lb (62.6 kg)  08/12/18 131 lb 6.4 oz (  59.6 kg)     Lab Results  Component Value Date   WBC 5.2 02/13/2020   HGB 13.0 02/13/2020   HCT 38.6 02/13/2020   PLT 193.0 02/13/2020   GLUCOSE 98 02/13/2020   CHOL 181 02/13/2020   TRIG 114.0 02/13/2020   HDL 45.20 02/13/2020   LDLCALC 113 (H) 02/13/2020   ALT 11 02/13/2020   AST 17 02/13/2020   NA 140 02/13/2020   K 3.9 02/13/2020   CL 106 02/13/2020   CREATININE 1.10 02/13/2020   BUN 12 02/13/2020   CO2 26 02/13/2020   TSH 0.28 (L) 02/13/2020    DG Ribs Unilateral W/Chest Right  Result Date: 09/17/2019 CLINICAL DATA:  Right rib pain. EXAM: RIGHT RIBS AND CHEST - 3+ VIEW COMPARISON:  None. FINDINGS: No fracture or other bone lesions are seen involving the ribs. There is no evidence of pneumothorax or pleural effusion. Both lungs are clear. Heart size and mediastinal contours are within normal limits. IMPRESSION: Negative. Electronically Signed   By: Kathreen Devoid   On: 09/17/2019 14:26   DG Wrist Complete Right  Result Date: 09/17/2019 CLINICAL DATA:  Status post fall, right wrist pain EXAM: RIGHT WRIST - COMPLETE 3+ VIEW COMPARISON:  None. FINDINGS: Generalized osteopenia. Comminuted and impacted distal radial metaphysis fracture extending to the articular surface of the radiocarpal joint. Nondisplaced fracture of the ulnar styloid process. Mild osteoarthritis of the first [facility] joint, first MCP joint and first IP joint. Soft tissue swelling around the right wrist. IMPRESSION: 1. Comminuted and impacted distal radial metaphysis fracture extending to the articular surface of the radiocarpal joint. 2. Nondisplaced fracture of the ulnar styloid process. Electronically Signed   By: Kathreen Devoid   On: 09/17/2019 14:28   CT Head Wo Contrast  Result Date: 09/17/2019 CLINICAL DATA:  Poly trauma head and C-spine injury. EXAM: CT HEAD  WITHOUT CONTRAST CT CERVICAL SPINE WITHOUT CONTRAST TECHNIQUE: Multidetector CT imaging of the head and cervical spine was performed following the standard protocol without intravenous contrast. Multiplanar CT image reconstructions of the cervical spine were also generated. COMPARISON:  None FINDINGS: CT HEAD FINDINGS Brain: No evidence of acute infarction, hemorrhage, hydrocephalus, extra-axial collection or mass lesion/mass effect. Signs of chronic microvascular ischemic change and atrophy. Vascular: No hyperdense vessel or unexpected calcification. Skull: Normal. Negative for fracture or focal lesion. Sinuses/Orbits: No acute finding. Other: Signs of right frontal hematoma. Small hematoma without underlying fracture. CT CERVICAL SPINE FINDINGS Alignment: Normal. Skull base and vertebrae: No acute fracture. No primary bone lesion or focal pathologic process. Soft tissues and spinal canal: No prevertebral fluid or swelling. No visible canal hematoma. Disc levels: Multilevel degenerative changes in the spine greatest at C5-6 and C6-7 with mild to moderate anterior osteophytes and uncovertebral degenerative changes. Upper chest: Negative. Other: None IMPRESSION: 1. No CT evidence for acute intracranial process. 2. Signs of chronic microvascular ischemic change and atrophy. 3. Signs of right frontal hematoma without underlying fracture. 4. No fracture or malalignment in the cervical spine. Electronically Signed   By: Zetta Bills M.D.   On: 09/17/2019 14:36   CT Cervical Spine Wo Contrast  Result Date: 09/17/2019 CLINICAL DATA:  Poly trauma head and C-spine injury. EXAM: CT HEAD WITHOUT CONTRAST CT CERVICAL SPINE WITHOUT CONTRAST TECHNIQUE: Multidetector CT imaging of the head and cervical spine was performed following the standard protocol without intravenous contrast. Multiplanar CT image reconstructions of the cervical spine were also generated. COMPARISON:  None FINDINGS: CT HEAD FINDINGS Brain: No evidence  of acute infarction, hemorrhage, hydrocephalus, extra-axial collection or mass lesion/mass effect. Signs of chronic microvascular ischemic change and atrophy. Vascular: No hyperdense vessel or unexpected calcification. Skull: Normal. Negative for fracture or focal lesion. Sinuses/Orbits: No acute finding. Other: Signs of right frontal hematoma. Small hematoma without underlying fracture. CT CERVICAL SPINE FINDINGS Alignment: Normal. Skull base and vertebrae: No acute fracture. No primary bone lesion or focal pathologic process. Soft tissues and spinal canal: No prevertebral fluid or swelling. No visible canal hematoma. Disc levels: Multilevel degenerative changes in the spine greatest at C5-6 and C6-7 with mild to moderate anterior osteophytes and uncovertebral degenerative changes. Upper chest: Negative. Other: None IMPRESSION: 1. No CT evidence for acute intracranial process. 2. Signs of chronic microvascular ischemic change and atrophy. 3. Signs of right frontal hematoma without underlying fracture. 4. No fracture or malalignment in the cervical spine. Electronically Signed   By: Zetta Bills M.D.   On: 09/17/2019 14:36       Assessment & Plan:   Problem List Items Addressed This Visit    Closed fracture of distal end of left radius    Followed by Dr Amedeo Plenty.        Essential hypertension, benign    Blood pressure elevated.  Increase lisinopril to 10mg  bid (pt preferred this dosing regimen).  Follow pressures.  Send in readings.  Get her back in soon to reassess.  Check metabolic panel.       Relevant Medications   lisinopril (ZESTRIL) 10 MG tablet   Other Relevant Orders   CBC with Differential/Platelet (Completed)   Comprehensive metabolic panel (Completed)   History of colonic polyps    Colonoscopy 12/2017.  Followed by GI - Dr Hilarie Fredrickson.       Hypercholesterolemia    Low cholesterol diet and exercise.  Continues to decline starting cholesterol medication.       Relevant Medications    lisinopril (ZESTRIL) 10 MG tablet   Other Relevant Orders   Lipid panel (Completed)   Hypothyroidism - Primary    On thyroid replacement.  Follow tsh.       Relevant Orders   TSH (Completed)       Einar Pheasant, MD

## 2020-02-13 NOTE — Telephone Encounter (Signed)
Pt asked for you to mail her a copy of her blood results when they come back.

## 2020-02-14 NOTE — Telephone Encounter (Signed)
Will mail once I have results

## 2020-02-15 ENCOUNTER — Other Ambulatory Visit: Payer: Self-pay

## 2020-02-15 MED ORDER — LEVOTHYROXINE SODIUM 50 MCG PO TABS: 50.0000 ug | ORAL_TABLET | Freq: Every day | ORAL | 2 refills | Status: DC

## 2020-02-15 NOTE — Telephone Encounter (Signed)
Pt called to ask about lab results

## 2020-02-15 NOTE — Telephone Encounter (Signed)
See result note.  

## 2020-02-16 ENCOUNTER — Telehealth: Payer: Self-pay | Admitting: Internal Medicine

## 2020-02-16 NOTE — Telephone Encounter (Signed)
PRESCRIPTION HAS BEEN FILLED FOR BRAND NAME. PT IS AWARE.

## 2020-02-16 NOTE — Telephone Encounter (Signed)
Pt called back stated that the pharmacy stated that they still do not have the Prescription the band yet

## 2020-02-19 ENCOUNTER — Encounter: Payer: Self-pay | Admitting: Internal Medicine

## 2020-02-19 NOTE — Assessment & Plan Note (Signed)
Blood pressure elevated.  Increase lisinopril to 10mg  bid (pt preferred this dosing regimen).  Follow pressures.  Send in readings.  Get her back in soon to reassess.  Check metabolic panel.

## 2020-02-19 NOTE — Assessment & Plan Note (Signed)
Low cholesterol diet and exercise.  Continues to decline starting cholesterol medication.

## 2020-02-19 NOTE — Assessment & Plan Note (Signed)
Followed by Dr Amedeo Plenty.

## 2020-02-19 NOTE — Assessment & Plan Note (Signed)
Colonoscopy 12/2017.  Followed by GI - Dr Hilarie Fredrickson.

## 2020-02-19 NOTE — Assessment & Plan Note (Signed)
On thyroid replacement.  Follow tsh.  

## 2020-02-21 DIAGNOSIS — G5601 Carpal tunnel syndrome, right upper limb: Secondary | ICD-10-CM | POA: Diagnosis not present

## 2020-02-21 DIAGNOSIS — M65341 Trigger finger, right ring finger: Secondary | ICD-10-CM | POA: Diagnosis not present

## 2020-02-21 DIAGNOSIS — M25531 Pain in right wrist: Secondary | ICD-10-CM | POA: Diagnosis not present

## 2020-03-08 ENCOUNTER — Telehealth: Payer: Self-pay | Admitting: Internal Medicine

## 2020-03-08 NOTE — Telephone Encounter (Signed)
[employer] [place] asking about a bone density test request that was faxed over on 8/10 for patient

## 2020-03-08 NOTE — Telephone Encounter (Signed)
I do not have this record and there is no CB # listed.

## 2020-03-20 ENCOUNTER — Ambulatory Visit (INDEPENDENT_AMBULATORY_CARE_PROVIDER_SITE_OTHER): Payer: Medicare HMO | Admitting: Internal Medicine

## 2020-03-20 ENCOUNTER — Encounter: Payer: Self-pay | Admitting: Internal Medicine

## 2020-03-20 ENCOUNTER — Other Ambulatory Visit: Payer: Self-pay

## 2020-03-20 VITALS — BP 140/88 | HR 101 | Temp 98.7°F | Ht <= 58 in | Wt 133.0 lb

## 2020-03-20 DIAGNOSIS — I10 Essential (primary) hypertension: Secondary | ICD-10-CM

## 2020-03-20 DIAGNOSIS — E039 Hypothyroidism, unspecified: Secondary | ICD-10-CM | POA: Diagnosis not present

## 2020-03-20 DIAGNOSIS — E7800 Pure hypercholesterolemia, unspecified: Secondary | ICD-10-CM

## 2020-03-20 LAB — BASIC METABOLIC PANEL
BUN: 16 mg/dL (ref 6–23)
CO2: 25 mEq/L (ref 19–32)
Calcium: 9.1 mg/dL (ref 8.4–10.5)
Chloride: 106 mEq/L (ref 96–112)
Creatinine, Ser: 1.13 mg/dL (ref 0.40–1.20)
GFR: 47.25 mL/min — ABNORMAL LOW (ref >60.00)
Glucose, Bld: 114 mg/dL — ABNORMAL HIGH (ref 70–99)
Potassium: 4 mEq/L (ref 3.5–5.1)
Sodium: 139 mEq/L (ref 135–145)

## 2020-03-20 LAB — TSH: TSH: 4.52 u[IU]/mL — ABNORMAL HIGH (ref 0.35–4.50)

## 2020-03-20 NOTE — Progress Notes (Signed)
Patient ID: Ann Perkins, female   DOB: [birth year], 74 y.o.   MRN: [id number]   Subjective:    Patient ID: Ann Perkins, female    DOB: [birth year], 74 y.o.   MRN: [id number]  HPI This visit occurred during the SARS-CoV-2 public health emergency.  Safety protocols were in place, including screening questions prior to the visit, additional usage of staff PPE, and extensive cleaning of exam room while observing appropriate contact time as indicated for disinfecting solutions.  Patient here for a scheduled follow up. She reports she is doing relatively well.  Tries to stay active.  No chest pain or sob with increased activity or exertion.  No nausea or vomiting.  Bowels moving.  Outside blood pressure readings reviewed - averaging 113-136/70-80s.  Recently adjusted synthroid dose.  Doing well with this.  Discussed labs.  She had questions about various lab results.    Past Medical History:  Diagnosis Date  . Allergy   . GERD (gastroesophageal reflux disease)   . Hemorrhoids   . History of colon polyps   . Hypertension   . Thyroid disease    Past Surgical History:  Procedure Laterality Date  . COLONOSCOPY  2016  . MOUTH SURGERY     x 2  . TONSILLECTOMY     Family History  Problem Relation Age of Onset  . Hyperlipidemia Mother   . Heart disease Mother   . Diabetes Mother   . Stroke Father   . Colon cancer Brother        Half(Father Side)  . Colon cancer Sister        x 2 Half(Father Side)  . Colon cancer Cousin        Mother Side   . Esophageal cancer Neg Hx   . Rectal cancer Neg Hx   . Stomach cancer Neg Hx    Social History   Socioeconomic History  . Marital status: Single    Spouse name: Not on file  . Number of children: Not on file  . Years of education: Not on file  . Highest education level: Not on file  Occupational History  . Occupation: Retired  Tobacco Use  . Smoking status: Never Smoker  . Smokeless tobacco: Never Used  Vaping Use  . Vaping Use: Never used    Substance and Sexual Activity  . Alcohol use: No    Alcohol/week: 0.0 standard drinks  . Drug use: No  . Sexual activity: Not on file  Other Topics Concern  . Not on file  Social History Narrative  . Not on file   Social Determinants of Health   Financial Resource Strain:   . Difficulty of Paying Living Expenses: Not on file  Food Insecurity:   . Worried About Charity fundraiser in the Last Year: Not on file  . Ran Out of Food in the Last Year: Not on file  Transportation Needs:   . Lack of Transportation (Medical): Not on file  . Lack of Transportation (Non-Medical): Not on file  Physical Activity:   . Days of Exercise per Week: Not on file  . Minutes of Exercise per Session: Not on file  Stress:   . Feeling of Stress : Not on file  Social Connections:   . Frequency of Communication with Friends and Family: Not on file  . Frequency of Social Gatherings with Friends and Family: Not on file  . Attends Religious Services: Not on file  . Active Member of Clubs or Organizations: Not  on file  . Attends Archivist Meetings: Not on file  . Marital Status: Not on file    Outpatient Encounter Medications as of 03/20/2020  Medication Sig  . levothyroxine (SYNTHROID) 50 MCG tablet Take 1 tablet (50 mcg total) by mouth daily before breakfast.  . lisinopril (ZESTRIL) 10 MG tablet Take 1 tablet (10 mg total) by mouth in the morning and at bedtime.  . sodium chloride (OCEAN) 0.65 % nasal spray Place 1 spray into the nose as needed for congestion.   Facility-Administered Encounter Medications as of 03/20/2020  Medication  . 0.9 %  sodium chloride infusion    Review of Systems  Constitutional: Negative for appetite change and unexpected weight change.  HENT: Negative for congestion and sinus pressure.   Respiratory: Negative for cough, chest tightness and shortness of breath.   Cardiovascular: Negative for chest pain, palpitations and leg swelling.  Gastrointestinal:  Negative for abdominal pain, diarrhea, nausea and vomiting.  Genitourinary: Negative for difficulty urinating and dysuria.  Musculoskeletal: Negative for joint swelling and myalgias.  Skin: Negative for color change and rash.  Neurological: Negative for dizziness, light-headedness and headaches.  Psychiatric/Behavioral: Negative for agitation and dysphoric mood.       Objective:    Physical Exam Vitals reviewed.  Constitutional:      General: She is not in acute distress.    Appearance: Normal appearance.  HENT:     Head: Normocephalic and atraumatic.     Right Ear: External ear normal.     Left Ear: External ear normal.  Eyes:     General: No scleral icterus.       Right eye: No discharge.        Left eye: No discharge.     Conjunctiva/sclera: Conjunctivae normal.  Neck:     Thyroid: No thyromegaly.  Cardiovascular:     Rate and Rhythm: Normal rate and regular rhythm.  Pulmonary:     Effort: No respiratory distress.     Breath sounds: Normal breath sounds. No wheezing.  Abdominal:     General: Bowel sounds are normal.     Palpations: Abdomen is soft.     Tenderness: There is no abdominal tenderness.  Musculoskeletal:        General: No swelling or tenderness.     Cervical back: Neck supple. No tenderness.  Lymphadenopathy:     Cervical: No cervical adenopathy.  Skin:    Findings: No erythema or rash.  Neurological:     Mental Status: She is alert.  Psychiatric:        Mood and Affect: Mood normal.        Behavior: Behavior normal.     BP 140/88 (BP Location: Left Arm, Patient Position: Sitting)   Pulse (!) 101   Temp 98.7 F (37.1 C)   Ht 4' 9.99" (1.473 m)   Wt 133 lb (60.3 kg)   LMP 07/21/1991   SpO2 96%   BMI 27.80 kg/m  Wt Readings from Last 3 Encounters:  03/20/20 133 lb (60.3 kg)  02/13/20 129 lb 6.4 oz (58.7 kg)  09/17/19 138 lb (62.6 kg)     Lab Results  Component Value Date   WBC 5.2 02/13/2020   HGB 13.0 02/13/2020   HCT 38.6  02/13/2020   PLT 193.0 02/13/2020   GLUCOSE 114 (H) 03/20/2020   CHOL 181 02/13/2020   TRIG 114.0 02/13/2020   HDL 45.20 02/13/2020   LDLCALC 113 (H) 02/13/2020   ALT 11 02/13/2020  AST 17 02/13/2020   NA 139 03/20/2020   K 4.0 03/20/2020   CL 106 03/20/2020   CREATININE 1.13 03/20/2020   BUN 16 03/20/2020   CO2 25 03/20/2020   TSH 4.52 (H) 03/20/2020    DG Ribs Unilateral W/Chest Right  Result Date: 09/17/2019 CLINICAL DATA:  Right rib pain. EXAM: RIGHT RIBS AND CHEST - 3+ VIEW COMPARISON:  None. FINDINGS: No fracture or other bone lesions are seen involving the ribs. There is no evidence of pneumothorax or pleural effusion. Both lungs are clear. Heart size and mediastinal contours are within normal limits. IMPRESSION: Negative. Electronically Signed   By: Kathreen Devoid   On: 09/17/2019 14:26   DG Wrist Complete Right  Result Date: 09/17/2019 CLINICAL DATA:  Status post fall, right wrist pain EXAM: RIGHT WRIST - COMPLETE 3+ VIEW COMPARISON:  None. FINDINGS: Generalized osteopenia. Comminuted and impacted distal radial metaphysis fracture extending to the articular surface of the radiocarpal joint. Nondisplaced fracture of the ulnar styloid process. 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 Soft tissue swelling around the right wrist. IMPRESSION: 1. Comminuted and impacted distal radial metaphysis fracture extending to the articular surface of the radiocarpal joint. 2. Nondisplaced fracture of the ulnar styloid process. Electronically Signed   By: Kathreen Devoid   On: 09/17/2019 14:28   CT Head Wo Contrast  Result Date: 09/17/2019 CLINICAL DATA:  Poly trauma head and C-spine injury. EXAM: CT HEAD WITHOUT CONTRAST CT CERVICAL SPINE WITHOUT CONTRAST TECHNIQUE: Multidetector CT imaging of the head and cervical spine was performed following the standard protocol without intravenous contrast. Multiplanar CT image reconstructions of the cervical spine were  also generated. COMPARISON:  None FINDINGS: CT HEAD FINDINGS Brain: No evidence of acute infarction, hemorrhage, hydrocephalus, extra-axial collection or mass lesion/mass effect. Signs of chronic microvascular ischemic change and atrophy. Vascular: No hyperdense vessel or unexpected calcification. Skull: Normal. Negative for fracture or focal lesion. Sinuses/Orbits: No acute finding. Other: Signs of right frontal hematoma. Small hematoma without underlying fracture. CT CERVICAL SPINE FINDINGS Alignment: Normal. Skull base and vertebrae: No acute fracture. No primary bone lesion or focal pathologic process. Soft tissues and spinal canal: No prevertebral fluid or swelling. No visible canal hematoma. Disc levels: Multilevel degenerative changes in the spine greatest at C5-6 and C6-7 with mild to moderate anterior osteophytes and uncovertebral degenerative changes. Upper chest: Negative. Other: None IMPRESSION: 1. No CT evidence for acute intracranial process. 2. Signs of chronic microvascular ischemic change and atrophy. 3. Signs of right frontal hematoma without underlying fracture. 4. No fracture or malalignment in the cervical spine. Electronically Signed   By: Zetta Bills M.D.   On: 09/17/2019 14:36   CT Cervical Spine Wo Contrast  Result Date: 09/17/2019 CLINICAL DATA:  Poly trauma head and C-spine injury. EXAM: CT HEAD WITHOUT CONTRAST CT CERVICAL SPINE WITHOUT CONTRAST TECHNIQUE: Multidetector CT imaging of the head and cervical spine was performed following the standard protocol without intravenous contrast. Multiplanar CT image reconstructions of the cervical spine were also generated. COMPARISON:  None FINDINGS: CT HEAD FINDINGS Brain: No evidence of acute infarction, hemorrhage, hydrocephalus, extra-axial collection or mass lesion/mass effect. Signs of chronic microvascular ischemic change and atrophy. Vascular: No hyperdense vessel or unexpected calcification. Skull: Normal. Negative for fracture or  focal lesion. Sinuses/Orbits: No acute finding. Other: Signs of right frontal hematoma. Small hematoma without underlying fracture. CT CERVICAL SPINE FINDINGS Alignment: Normal. Skull base and vertebrae: No acute fracture. No primary bone  lesion or focal pathologic process. Soft tissues and spinal canal: No prevertebral fluid or swelling. No visible canal hematoma. Disc levels: Multilevel degenerative changes in the spine greatest at C5-6 and C6-7 with mild to moderate anterior osteophytes and uncovertebral degenerative changes. Upper chest: Negative. Other: None IMPRESSION: 1. No CT evidence for acute intracranial process. 2. Signs of chronic microvascular ischemic change and atrophy. 3. Signs of right frontal hematoma without underlying fracture. 4. No fracture or malalignment in the cervical spine. Electronically Signed   By: Zetta Bills M.D.   On: 09/17/2019 14:36       Assessment & Plan:   Problem List Items Addressed This Visit    Hypothyroidism - Primary    On thyroid replacement.  Just adjusted the dose.  Follow tsh.       Relevant Orders   TSH (Completed)   Hypercholesterolemia    The 10-year ASCVD risk score Mikey Bussing DC Jr., et al., 2013) is: 18.3%   Values used to calculate the score:     Age: 36 years     Sex: Female     Is Non-Hispanic African American: No     Diabetic: No     Tobacco smoker: No     Systolic Blood Pressure: 616 mmHg     Is BP treated: Yes     HDL Cholesterol: 45.2 mg/dL     Total Cholesterol: 181 mg/dL  Discussed calculated cholesterol risk.  Discussed recommendation to start a cholesterol medication.  She declines.  Low cholesterol diet and exercise.  Follow lipid panel.       Essential hypertension, benign    Blood pressure recheck improved.  Checks outside of here - improved.  Continue lisinopril.  Follow pressures.  Follow metabolic panel.       Relevant Orders   Basic metabolic panel (Completed)       Einar Pheasant, MD

## 2020-03-20 NOTE — Assessment & Plan Note (Addendum)
The 10-year ASCVD risk score [name]., et al., 2013) is: 18.3%   Values used to calculate the score:     Age: 74 years     Sex: Female     Is Non-Hispanic African American: No     Diabetic: No     Tobacco smoker: No     Systolic Blood Pressure: 171 mmHg     Is BP treated: Yes     HDL Cholesterol: 45.2 mg/dL     Total Cholesterol: 181 mg/dL  Discussed calculated cholesterol risk.  Discussed recommendation to start a cholesterol medication.  She declines.  Low cholesterol diet and exercise.  Follow lipid panel.

## 2020-03-25 ENCOUNTER — Encounter: Payer: Self-pay | Admitting: Internal Medicine

## 2020-03-25 NOTE — Assessment & Plan Note (Signed)
On thyroid replacement.  Just adjusted the dose.  Follow tsh.

## 2020-03-25 NOTE — Assessment & Plan Note (Signed)
Blood pressure recheck improved.  Checks outside of here - improved.  Continue lisinopril.  Follow pressures.  Follow metabolic panel.

## 2020-04-04 DIAGNOSIS — H524 Presbyopia: Secondary | ICD-10-CM | POA: Diagnosis not present

## 2020-04-04 DIAGNOSIS — H25013 Cortical age-related cataract, bilateral: Secondary | ICD-10-CM | POA: Diagnosis not present

## 2020-04-04 DIAGNOSIS — H5213 Myopia, bilateral: Secondary | ICD-10-CM | POA: Diagnosis not present

## 2020-04-04 DIAGNOSIS — H2513 Age-related nuclear cataract, bilateral: Secondary | ICD-10-CM | POA: Diagnosis not present

## 2020-04-04 DIAGNOSIS — H52223 Regular astigmatism, bilateral: Secondary | ICD-10-CM | POA: Diagnosis not present

## 2020-04-09 DIAGNOSIS — R69 Illness, unspecified: Secondary | ICD-10-CM | POA: Diagnosis not present

## 2020-04-10 ENCOUNTER — Other Ambulatory Visit: Payer: Self-pay | Admitting: Internal Medicine

## 2020-04-23 DIAGNOSIS — R69 Illness, unspecified: Secondary | ICD-10-CM | POA: Diagnosis not present

## 2020-04-26 ENCOUNTER — Telehealth: Payer: Self-pay | Admitting: *Deleted

## 2020-04-26 DIAGNOSIS — I10 Essential (primary) hypertension: Secondary | ICD-10-CM

## 2020-04-26 DIAGNOSIS — E039 Hypothyroidism, unspecified: Secondary | ICD-10-CM

## 2020-04-26 NOTE — Telephone Encounter (Signed)
Please place future orders for lab appt.  

## 2020-04-27 NOTE — Telephone Encounter (Signed)
Orders placed for labs

## 2020-04-30 ENCOUNTER — Other Ambulatory Visit (INDEPENDENT_AMBULATORY_CARE_PROVIDER_SITE_OTHER): Payer: Medicare HMO

## 2020-04-30 ENCOUNTER — Other Ambulatory Visit: Payer: Self-pay

## 2020-04-30 DIAGNOSIS — E039 Hypothyroidism, unspecified: Secondary | ICD-10-CM | POA: Diagnosis not present

## 2020-04-30 DIAGNOSIS — Z23 Encounter for immunization: Secondary | ICD-10-CM | POA: Diagnosis not present

## 2020-04-30 DIAGNOSIS — I10 Essential (primary) hypertension: Secondary | ICD-10-CM | POA: Diagnosis not present

## 2020-04-30 LAB — BASIC METABOLIC PANEL
BUN: 17 mg/dL (ref 6–23)
CO2: 28 mEq/L (ref 19–32)
Calcium: 9.5 mg/dL (ref 8.4–10.5)
Chloride: 104 mEq/L (ref 96–112)
Creatinine, Ser: 1.17 mg/dL (ref 0.40–1.20)
GFR: 46.29 mL/min — ABNORMAL LOW (ref >60.00)
Glucose, Bld: 110 mg/dL — ABNORMAL HIGH (ref 70–99)
Potassium: 4.1 mEq/L (ref 3.5–5.1)
Sodium: 140 mEq/L (ref 135–145)

## 2020-04-30 LAB — TSH: TSH: 1.05 u[IU]/mL (ref 0.35–4.50)

## 2020-05-20 DIAGNOSIS — R69 Illness, unspecified: Secondary | ICD-10-CM | POA: Diagnosis not present

## 2020-05-21 ENCOUNTER — Other Ambulatory Visit: Payer: Self-pay | Admitting: Internal Medicine

## 2020-05-21 DIAGNOSIS — R69 Illness, unspecified: Secondary | ICD-10-CM | POA: Diagnosis not present

## 2020-06-25 ENCOUNTER — Encounter: Payer: Self-pay | Admitting: Internal Medicine

## 2020-06-25 ENCOUNTER — Ambulatory Visit (INDEPENDENT_AMBULATORY_CARE_PROVIDER_SITE_OTHER): Payer: Medicare HMO | Admitting: Internal Medicine

## 2020-06-25 ENCOUNTER — Other Ambulatory Visit: Payer: Self-pay | Admitting: Internal Medicine

## 2020-06-25 ENCOUNTER — Other Ambulatory Visit: Payer: Self-pay

## 2020-06-25 DIAGNOSIS — I10 Essential (primary) hypertension: Secondary | ICD-10-CM | POA: Diagnosis not present

## 2020-06-25 DIAGNOSIS — E7800 Pure hypercholesterolemia, unspecified: Secondary | ICD-10-CM

## 2020-06-25 DIAGNOSIS — E039 Hypothyroidism, unspecified: Secondary | ICD-10-CM | POA: Diagnosis not present

## 2020-06-25 DIAGNOSIS — Z0000 Encounter for general adult medical examination without abnormal findings: Secondary | ICD-10-CM

## 2020-06-25 MED ORDER — AMLODIPINE BESYLATE 2.5 MG PO TABS: 2.5000 mg | ORAL_TABLET | Freq: Every day | ORAL | 2 refills | Status: DC

## 2020-06-25 NOTE — Progress Notes (Signed)
Patient ID: Ann Perkins, female   DOB: [birth year], 74 y.o.   MRN: [id number]   Subjective:    Patient ID: Ann Perkins, female    DOB: 1946/03/09, 74 y.o.   MRN: [id number]  HPI This visit occurred during the SARS-CoV-2 public health emergency.  Safety protocols were in place, including screening questions prior to the visit, additional usage of staff PPE, and extensive cleaning of exam room while observing appropriate contact time as indicated for disinfecting solutions.  Patient with past history of hypertension, hypothyroidism and hypercholesterolemia.  She comes in today to follow up on these issues as well as for a complete physical exam.  She reports she is doing relatively well.  Tries to stay active.  No chest pain or sob reported.  No abdominal pain or bowel change reported.  Handling stress.  Blood pressures at home - 120s/60-70s.  Will bring her cuff next visit.  Blood pressure here elevated.  Taking lisinopril.    Past Medical History:  Diagnosis Date  . Allergy   . GERD (gastroesophageal reflux disease)   . Hemorrhoids   . History of colon polyps   . Hypertension   . Thyroid disease    Past Surgical History:  Procedure Laterality Date  . COLONOSCOPY  2016  . MOUTH SURGERY     x 2  . TONSILLECTOMY     Family History  Problem Relation Age of Onset  . Hyperlipidemia Mother   . Heart disease Mother   . Diabetes Mother   . Stroke Father   . Colon cancer Brother        Half(Father Side)  . Colon cancer Sister        x 2 Half(Father Side)  . Colon cancer Cousin        Mother Side   . Esophageal cancer Neg Hx   . Rectal cancer Neg Hx   . Stomach cancer Neg Hx    Social History   Socioeconomic History  . Marital status: Single    Spouse name: Not on file  . Number of children: Not on file  . Years of education: Not on file  . Highest education level: Not on file  Occupational History  . Occupation: Retired  Tobacco Use  . Smoking status: Never Smoker  .  Smokeless tobacco: Never Used  Vaping Use  . Vaping Use: Never used  Substance and Sexual Activity  . Alcohol use: No    Alcohol/week: 0.0 standard drinks  . Drug use: No  . Sexual activity: Not on file  Other Topics Concern  . Not on file  Social History Narrative  . Not on file   Social Determinants of Health   Financial Resource Strain: Not on file  Food Insecurity: Not on file  Transportation Needs: Not on file  Physical Activity: Not on file  Stress: Not on file  Social Connections: Not on file   Outpatient Encounter Medications as of 06/25/2020  Medication Sig  . lisinopril (ZESTRIL) 10 MG tablet TAKE ONE TABLET BY MOUTH TWICE DAILY, INTHE MORNING AND AT BEDTIME.  . sodium chloride (OCEAN) 0.65 % nasal spray Place 1 spray into the nose as needed for congestion.  Marland Kitchen SYNTHROID 75 MCG tablet TAKE 1 TABLET BY MOUTH DAILY  . [DISCONTINUED] amLODipine (NORVASC) 2.5 MG tablet Take 1 tablet (2.5 mg total) by mouth daily.  . [DISCONTINUED] levothyroxine (SYNTHROID) 50 MCG tablet Take 1 tablet (50 mcg total) by mouth daily before breakfast. (Patient not taking: Reported on 06/25/2020)  Facility-Administered Encounter Medications as of 06/25/2020  Medication  . 0.9 %  sodium chloride infusion    Review of Systems  Constitutional: Negative for appetite change and unexpected weight change.  HENT: Negative for congestion, sinus pressure and sore throat.   Eyes: Negative for pain and visual disturbance.  Respiratory: Negative for cough, chest tightness and shortness of breath.   Cardiovascular: Negative for chest pain, palpitations and leg swelling.  Gastrointestinal: Negative for abdominal pain, diarrhea, nausea and vomiting.  Genitourinary: Negative for difficulty urinating and dysuria.  Musculoskeletal: Negative for back pain and joint swelling.  Skin: Negative for color change and rash.  Neurological: Negative for dizziness, light-headedness and headaches.  Hematological:  Negative for adenopathy. Does not bruise/bleed easily.  Psychiatric/Behavioral: Negative for agitation and dysphoric mood.       Objective:    Physical Exam Vitals reviewed.  Constitutional:      General: She is not in acute distress.    Appearance: Normal appearance. She is well-developed and well-nourished.  HENT:     Head: Normocephalic and atraumatic.     Right Ear: External ear normal.     Left Ear: External ear normal.     Mouth/Throat:     Mouth: Oropharynx is clear and moist.  Eyes:     General: No scleral icterus.       Right eye: No discharge.        Left eye: No discharge.     Conjunctiva/sclera: Conjunctivae normal.  Neck:     Thyroid: No thyromegaly.  Cardiovascular:     Rate and Rhythm: Normal rate and regular rhythm.  Pulmonary:     Effort: No tachypnea, accessory muscle usage or respiratory distress.     Breath sounds: Normal breath sounds. No decreased breath sounds or wheezing.  Chest:  Breasts:     Right: No inverted nipple, mass, nipple discharge or tenderness (no axillary adenopathy).     Left: No inverted nipple, mass, nipple discharge or tenderness (no axilarry adenopathy).    Abdominal:     General: Bowel sounds are normal.     Palpations: Abdomen is soft.     Tenderness: There is no abdominal tenderness.  Musculoskeletal:        General: No swelling, tenderness or edema.     Cervical back: Neck supple. No tenderness.  Lymphadenopathy:     Cervical: No cervical adenopathy.  Skin:    Findings: No erythema or rash.  Neurological:     Mental Status: She is alert and oriented to person, place, and time.  Psychiatric:        Mood and Affect: Mood and affect and mood normal.        Behavior: Behavior normal.     BP (!) 150/82   Pulse 84   Temp 97.7 F (36.5 C) (Oral)   Resp 16   Ht 4\' 9"  (1.448 m)   Wt 129 lb (58.5 kg)   LMP 07/21/1991   SpO2 98%   BMI 27.92 kg/m  Wt Readings from Last 3 Encounters:  06/25/20 129 lb (58.5 kg)   03/20/20 133 lb (60.3 kg)  02/13/20 129 lb 6.4 oz (58.7 kg)     Lab Results  Component Value Date   WBC 5.2 02/13/2020   HGB 13.0 02/13/2020   HCT 38.6 02/13/2020   PLT 193.0 02/13/2020   GLUCOSE 110 (H) 04/30/2020   CHOL 181 02/13/2020   TRIG 114.0 02/13/2020   HDL 45.20 02/13/2020   LDLCALC 113 (H) 02/13/2020  ALT 11 02/13/2020   AST 17 02/13/2020   NA 140 04/30/2020   K 4.1 04/30/2020   CL 104 04/30/2020   CREATININE 1.17 04/30/2020   BUN 17 04/30/2020   CO2 28 04/30/2020   TSH 1.05 04/30/2020    DG Ribs Unilateral W/Chest Right  Result Date: 09/17/2019 CLINICAL DATA:  Right rib pain. EXAM: RIGHT RIBS AND CHEST - 3+ VIEW COMPARISON:  None. FINDINGS: No fracture or other bone lesions are seen involving the ribs. There is no evidence of pneumothorax or pleural effusion. Both lungs are clear. Heart size and mediastinal contours are within normal limits. IMPRESSION: Negative. Electronically Signed   By: Kathreen Devoid   On: 09/17/2019 14:26   DG Wrist Complete Right  Result Date: 09/17/2019 CLINICAL DATA:  Status post fall, right wrist pain EXAM: RIGHT WRIST - COMPLETE 3+ VIEW COMPARISON:  None. FINDINGS: Generalized osteopenia. Comminuted and impacted distal radial metaphysis fracture extending to the articular surface of the radiocarpal joint. Nondisplaced fracture of the ulnar styloid process. Mild osteoarthritis of the first [facility] joint, first MCP joint and first IP joint. Soft tissue swelling around the right wrist. IMPRESSION: 1. Comminuted and impacted distal radial metaphysis fracture extending to the articular surface of the radiocarpal joint. 2. Nondisplaced fracture of the ulnar styloid process. Electronically Signed   By: Kathreen Devoid   On: 09/17/2019 14:28   CT Head Wo Contrast  Result Date: 09/17/2019 CLINICAL DATA:  Poly trauma head and C-spine injury. EXAM: CT HEAD WITHOUT CONTRAST CT CERVICAL SPINE WITHOUT CONTRAST TECHNIQUE: Multidetector CT imaging of the head  and cervical spine was performed following the standard protocol without intravenous contrast. Multiplanar CT image reconstructions of the cervical spine were also generated. COMPARISON:  None FINDINGS: CT HEAD FINDINGS Brain: No evidence of acute infarction, hemorrhage, hydrocephalus, extra-axial collection or mass lesion/mass effect. Signs of chronic microvascular ischemic change and atrophy. Vascular: No hyperdense vessel or unexpected calcification. Skull: Normal. Negative for fracture or focal lesion. Sinuses/Orbits: No acute finding. Other: Signs of right frontal hematoma. Small hematoma without underlying fracture. CT CERVICAL SPINE FINDINGS Alignment: Normal. Skull base and vertebrae: No acute fracture. No primary bone lesion or focal pathologic process. Soft tissues and spinal canal: No prevertebral fluid or swelling. No visible canal hematoma. Disc levels: Multilevel degenerative changes in the spine greatest at C5-6 and C6-7 with mild to moderate anterior osteophytes and uncovertebral degenerative changes. Upper chest: Negative. Other: None IMPRESSION: 1. No CT evidence for acute intracranial process. 2. Signs of chronic microvascular ischemic change and atrophy. 3. Signs of right frontal hematoma without underlying fracture. 4. No fracture or malalignment in the cervical spine. Electronically Signed   By: Zetta Bills M.D.   On: 09/17/2019 14:36   CT Cervical Spine Wo Contrast  Result Date: 09/17/2019 CLINICAL DATA:  Poly trauma head and C-spine injury. EXAM: CT HEAD WITHOUT CONTRAST CT CERVICAL SPINE WITHOUT CONTRAST TECHNIQUE: Multidetector CT imaging of the head and cervical spine was performed following the standard protocol without intravenous contrast. Multiplanar CT image reconstructions of the cervical spine were also generated. COMPARISON:  None FINDINGS: CT HEAD FINDINGS Brain: No evidence of acute infarction, hemorrhage, hydrocephalus, extra-axial collection or mass lesion/mass effect.  Signs of chronic microvascular ischemic change and atrophy. Vascular: No hyperdense vessel or unexpected calcification. Skull: Normal. Negative for fracture or focal lesion. Sinuses/Orbits: No acute finding. Other: Signs of right frontal hematoma. Small hematoma without underlying fracture. CT CERVICAL SPINE FINDINGS Alignment: Normal. Skull base and vertebrae: No acute  fracture. No primary bone lesion or focal pathologic process. Soft tissues and spinal canal: No prevertebral fluid or swelling. No visible canal hematoma. Disc levels: Multilevel degenerative changes in the spine greatest at C5-6 and C6-7 with mild to moderate anterior osteophytes and uncovertebral degenerative changes. Upper chest: Negative. Other: None IMPRESSION: 1. No CT evidence for acute intracranial process. 2. Signs of chronic microvascular ischemic change and atrophy. 3. Signs of right frontal hematoma without underlying fracture. 4. No fracture or malalignment in the cervical spine. Electronically Signed   By: Zetta Bills M.D.   On: 09/17/2019 14:36       Assessment & Plan:   Problem List Items Addressed This Visit    Hypothyroidism    On thyroid replacement.  Follow tsh.       Hypercholesterolemia    The 10-year ASCVD risk score Mikey Bussing DC Jr., et al., 2013) is: 20.7%   Values used to calculate the score:     Age: 66 years     Sex: Female     Is Non-Hispanic African American: No     Diabetic: No     Tobacco smoker: No     Systolic Blood Pressure: 734 mmHg     Is BP treated: Yes     HDL Cholesterol: 45.2 mg/dL     Total Cholesterol: 181 mg/dL  Discussed calculated cholesterol risk and recommendation to start cholesterol medication.  She declines.  Low cholesterol diet and exercise.  Follow lipid panel.       Health care maintenance    Physical today 06/25/20.  Colonoscopy 2019 - Dr Hilarie Fredrickson.  Declines mammogram.       Essential hypertension, benign    Blood pressure on outside checks wnl.  She will bring her  blood pressure cuff to next visit.  Recheck improved, but still elevated.  Taking lisinopril 10mg  bid.  Will add amlodipine 2.5mg  q day. Follow pressures.  Follow metabolic panel.           Einar Pheasant, MD

## 2020-07-01 ENCOUNTER — Encounter: Payer: Self-pay | Admitting: Internal Medicine

## 2020-07-01 NOTE — Assessment & Plan Note (Signed)
The 10-year ASCVD risk score [name]., et al., 2013) is: 20.7%   Values used to calculate the score:     Age: 74 years     Sex: Female     Is Non-Hispanic African American: No     Diabetic: No     Tobacco smoker: No     Systolic Blood Pressure: 945 mmHg     Is BP treated: Yes     HDL Cholesterol: 45.2 mg/dL     Total Cholesterol: 181 mg/dL  Discussed calculated cholesterol risk and recommendation to start cholesterol medication.  She declines.  Low cholesterol diet and exercise.  Follow lipid panel.

## 2020-07-01 NOTE — Assessment & Plan Note (Signed)
Physical today 06/25/20.  Colonoscopy 2019 - Dr Hilarie Fredrickson.  Declines mammogram.

## 2020-07-01 NOTE — Assessment & Plan Note (Signed)
On thyroid replacement.  Follow tsh.  

## 2020-07-01 NOTE — Assessment & Plan Note (Signed)
Blood pressure on outside checks wnl.  She will bring her blood pressure cuff to next visit.  Recheck improved, but still elevated.  Taking lisinopril 10mg  bid.  Will add amlodipine 2.5mg  q day. Follow pressures.  Follow metabolic panel.

## 2020-07-10 ENCOUNTER — Other Ambulatory Visit: Payer: Self-pay | Admitting: Internal Medicine

## 2020-07-22 ENCOUNTER — Other Ambulatory Visit: Payer: Self-pay | Admitting: Internal Medicine

## 2020-07-23 ENCOUNTER — Other Ambulatory Visit: Payer: Self-pay | Admitting: Internal Medicine

## 2020-08-07 ENCOUNTER — Ambulatory Visit: Payer: Medicare HMO | Admitting: Internal Medicine

## 2020-08-14 ENCOUNTER — Other Ambulatory Visit: Payer: Self-pay

## 2020-08-14 ENCOUNTER — Ambulatory Visit (INDEPENDENT_AMBULATORY_CARE_PROVIDER_SITE_OTHER): Payer: Medicare HMO | Admitting: Internal Medicine

## 2020-08-14 ENCOUNTER — Encounter: Payer: Self-pay | Admitting: Internal Medicine

## 2020-08-14 VITALS — BP 148/80 | HR 105 | Temp 98.2°F | Resp 16 | Ht <= 58 in | Wt 128.0 lb

## 2020-08-14 DIAGNOSIS — E7800 Pure hypercholesterolemia, unspecified: Secondary | ICD-10-CM

## 2020-08-14 DIAGNOSIS — N898 Other specified noninflammatory disorders of vagina: Secondary | ICD-10-CM | POA: Diagnosis not present

## 2020-08-14 DIAGNOSIS — I10 Essential (primary) hypertension: Secondary | ICD-10-CM | POA: Diagnosis not present

## 2020-08-14 DIAGNOSIS — E039 Hypothyroidism, unspecified: Secondary | ICD-10-CM | POA: Diagnosis not present

## 2020-08-14 LAB — BASIC METABOLIC PANEL
BUN: 16 mg/dL (ref 6–23)
CO2: 29 mEq/L (ref 19–32)
Calcium: 9.7 mg/dL (ref 8.4–10.5)
Chloride: 104 mEq/L (ref 96–112)
Creatinine, Ser: 1.11 mg/dL (ref 0.40–1.20)
GFR: 49.49 mL/min — ABNORMAL LOW (ref >60.00)
Glucose, Bld: 121 mg/dL — ABNORMAL HIGH (ref 70–99)
Potassium: 4.1 mEq/L (ref 3.5–5.1)
Sodium: 139 mEq/L (ref 135–145)

## 2020-08-14 LAB — TSH: TSH: 0.68 u[IU]/mL (ref 0.35–4.50)

## 2020-08-14 MED ORDER — MUPIROCIN 2 % EX OINT: 1.0000 "application " | TOPICAL_OINTMENT | Freq: Two times a day (BID) | CUTANEOUS | 0 refills | Status: DC

## 2020-08-14 NOTE — Progress Notes (Signed)
Patient ID: [name], female   DOB: [birth year], 75 y.o.   MRN: [id number]   Subjective:    Patient ID: [name], female    DOB: Apr 13, 1946, 75 y.o.   MRN: [id number]  HPI This visit occurred during the SARS-CoV-2 public health emergency.  Safety protocols were in place, including screening questions prior to the visit, additional usage of staff PPE, and extensive cleaning of exam room while observing appropriate contact time as indicated for disinfecting solutions.  Patient here for a scheduled follow up.  Here to follow up regarding her blood pressure and cholesterol.  Reviewed outside blood pressure readings.  Blood pressures range - 103-130/60s-80.  Brought her cuff.  Correlates.  She reports she had a stressful morning.  Car would not start.  Feels this is why blood pressure is elevated today.  Trying to stay active. No chest pain or sob reported.  No acid reflux or abdominal pain.  Bowels moving.  Does report vaginal lesion.  Has drained.  No significant pain.  No intravaginal irritation.  No discharge.  Previous pulling in back. Has not occurred lately.    Past Medical History:  Diagnosis Date   Allergy    GERD (gastroesophageal reflux disease)    Hemorrhoids    History of colon polyps    Hypertension    Thyroid disease    Past Surgical History:  Procedure Laterality Date   COLONOSCOPY  2016   MOUTH SURGERY     x 2   TONSILLECTOMY     Family History  Problem Relation Age of Onset   Hyperlipidemia Mother    Heart disease Mother    Diabetes Mother    Stroke Father    Colon cancer Brother        Half(Father Side)   Colon cancer Sister        x 2 Half(Father Side)   Colon cancer Cousin        Mother Side    Esophageal cancer Neg Hx    Rectal cancer Neg Hx    Stomach cancer Neg Hx    Social History   Socioeconomic History   Marital status: Single    Spouse name: Not on file   Number of children: Not on file   Years of education: Not on file    Highest education level: Not on file  Occupational History   Occupation: Retired  Tobacco Use   Smoking status: Never Smoker   Smokeless tobacco: Never Used  Scientific laboratory technician Use: Never used  Substance and Sexual Activity   Alcohol use: No    Alcohol/week: 0.0 standard drinks   Drug use: No   Sexual activity: Not on file  Other Topics Concern   Not on file  Social History Narrative   Not on file   Social Determinants of Health   Financial Resource Strain: Not on file  Food Insecurity: Not on file  Transportation Needs: Not on file  Physical Activity: Not on file  Stress: Not on file  Social Connections: Not on file    Outpatient Encounter Medications as of 08/14/2020  Medication Sig   amLODipine (NORVASC) 2.5 MG tablet TAKE 1 TABLET BY MOUTH DAILY   lisinopril (ZESTRIL) 10 MG tablet TAKE ONE TABLET BY MOUTH TWICE DAILY, INTHE MORNING AND AT BEDTIME.   mupirocin ointment (BACTROBAN) 2 % Apply 1 application topically 2 (two) times daily.   sodium chloride (OCEAN) 0.65 % nasal spray Place 1 spray into the nose as needed  for congestion.   SYNTHROID 75 MCG tablet TAKE 1 TABLET BY MOUTH DAILY   Facility-Administered Encounter Medications as of 08/14/2020  Medication   0.9 %  sodium chloride infusion    Review of Systems  Constitutional: Negative for appetite change and unexpected weight change.  HENT: Negative for congestion and sinus pressure.   Respiratory: Negative for cough, chest tightness and shortness of breath.   Cardiovascular: Negative for chest pain, palpitations and leg swelling.  Gastrointestinal: Negative for abdominal pain, diarrhea, nausea and vomiting.  Genitourinary: Negative for difficulty urinating and dysuria.  Musculoskeletal: Negative for joint swelling and myalgias.  Skin: Negative for color change and rash.  Neurological: Negative for dizziness, light-headedness and headaches.  Hematological: Negative for adenopathy.   Psychiatric/Behavioral: Negative for agitation and dysphoric mood.       Objective:    Physical Exam Vitals reviewed.  Constitutional:      General: She is not in acute distress.    Appearance: Normal appearance.  HENT:     Head: Normocephalic and atraumatic.     Right Ear: External ear normal.     Left Ear: External ear normal.     Mouth/Throat:     Mouth: Oropharynx is clear and moist.  Eyes:     General: No scleral icterus.       Right eye: No discharge.        Left eye: No discharge.     Conjunctiva/sclera: Conjunctivae normal.  Neck:     Thyroid: No thyromegaly.  Cardiovascular:     Rate and Rhythm: Normal rate and regular rhythm.  Pulmonary:     Effort: No respiratory distress.     Breath sounds: Normal breath sounds. No wheezing.  Abdominal:     General: Bowel sounds are normal.     Palpations: Abdomen is soft.     Tenderness: There is no abdominal tenderness.  Genitourinary:    Comments: Healing lesion - perivaginal area.  No pain.   Musculoskeletal:        General: No swelling, tenderness or edema.     Cervical back: Neck supple. No tenderness.  Lymphadenopathy:     Cervical: No cervical adenopathy.  Skin:    Findings: No erythema or rash.  Neurological:     Mental Status: She is alert.  Psychiatric:        Mood and Affect: Mood normal.        Behavior: Behavior normal.     BP (!) 148/80    Pulse (!) 105    Temp 98.2 F (36.8 C) (Oral)    Resp 16    Ht 4\' 9"  (1.448 m)    Wt 128 lb (58.1 kg)    LMP 07/21/1991    SpO2 99%    BMI 27.70 kg/m  Wt Readings from Last 3 Encounters:  08/14/20 128 lb (58.1 kg)  06/25/20 129 lb (58.5 kg)  03/20/20 133 lb (60.3 kg)     Lab Results  Component Value Date   WBC 5.2 02/13/2020   HGB 13.0 02/13/2020   HCT 38.6 02/13/2020   PLT 193.0 02/13/2020   GLUCOSE 121 (H) 08/14/2020   CHOL 181 02/13/2020   TRIG 114.0 02/13/2020   HDL 45.20 02/13/2020   LDLCALC 113 (H) 02/13/2020   ALT 11 02/13/2020   AST 17  02/13/2020   NA 139 08/14/2020   K 4.1 08/14/2020   CL 104 08/14/2020   CREATININE 1.11 08/14/2020   BUN 16 08/14/2020   CO2 29 08/14/2020  TSH 0.68 08/14/2020    DG Ribs Unilateral W/Chest Right  Result Date: 09/17/2019 CLINICAL DATA:  Right rib pain. EXAM: RIGHT RIBS AND CHEST - 3+ VIEW COMPARISON:  None. FINDINGS: No fracture or other bone lesions are seen involving the ribs. There is no evidence of pneumothorax or pleural effusion. Both lungs are clear. Heart size and mediastinal contours are within normal limits. IMPRESSION: Negative. Electronically Signed   By: Kathreen Devoid   On: 09/17/2019 14:26   DG Wrist Complete Right  Result Date: 09/17/2019 CLINICAL DATA:  Status post fall, right wrist pain EXAM: RIGHT WRIST - COMPLETE 3+ VIEW COMPARISON:  None. FINDINGS: Generalized osteopenia. Comminuted and impacted distal radial metaphysis fracture extending to the articular surface of the radiocarpal joint. Nondisplaced fracture of the ulnar styloid process. Mild osteoarthritis of the first [facility] joint, first MCP joint and first IP joint. Soft tissue swelling around the right wrist. IMPRESSION: 1. Comminuted and impacted distal radial metaphysis fracture extending to the articular surface of the radiocarpal joint. 2. Nondisplaced fracture of the ulnar styloid process. Electronically Signed   By: Kathreen Devoid   On: 09/17/2019 14:28   CT Head Wo Contrast  Result Date: 09/17/2019 CLINICAL DATA:  Poly trauma head and C-spine injury. EXAM: CT HEAD WITHOUT CONTRAST CT CERVICAL SPINE WITHOUT CONTRAST TECHNIQUE: Multidetector CT imaging of the head and cervical spine was performed following the standard protocol without intravenous contrast. Multiplanar CT image reconstructions of the cervical spine were also generated. COMPARISON:  None FINDINGS: CT HEAD FINDINGS Brain: No evidence of acute infarction, hemorrhage, hydrocephalus, extra-axial collection or mass lesion/mass effect. Signs of chronic  microvascular ischemic change and atrophy. Vascular: No hyperdense vessel or unexpected calcification. Skull: Normal. Negative for fracture or focal lesion. Sinuses/Orbits: No acute finding. Other: Signs of right frontal hematoma. Small hematoma without underlying fracture. CT CERVICAL SPINE FINDINGS Alignment: Normal. Skull base and vertebrae: No acute fracture. No primary bone lesion or focal pathologic process. Soft tissues and spinal canal: No prevertebral fluid or swelling. No visible canal hematoma. Disc levels: Multilevel degenerative changes in the spine greatest at C5-6 and C6-7 with mild to moderate anterior osteophytes and uncovertebral degenerative changes. Upper chest: Negative. Other: None IMPRESSION: 1. No CT evidence for acute intracranial process. 2. Signs of chronic microvascular ischemic change and atrophy. 3. Signs of right frontal hematoma without underlying fracture. 4. No fracture or malalignment in the cervical spine. Electronically Signed   By: Zetta Bills M.D.   On: 09/17/2019 14:36   CT Cervical Spine Wo Contrast  Result Date: 09/17/2019 CLINICAL DATA:  Poly trauma head and C-spine injury. EXAM: CT HEAD WITHOUT CONTRAST CT CERVICAL SPINE WITHOUT CONTRAST TECHNIQUE: Multidetector CT imaging of the head and cervical spine was performed following the standard protocol without intravenous contrast. Multiplanar CT image reconstructions of the cervical spine were also generated. COMPARISON:  None FINDINGS: CT HEAD FINDINGS Brain: No evidence of acute infarction, hemorrhage, hydrocephalus, extra-axial collection or mass lesion/mass effect. Signs of chronic microvascular ischemic change and atrophy. Vascular: No hyperdense vessel or unexpected calcification. Skull: Normal. Negative for fracture or focal lesion. Sinuses/Orbits: No acute finding. Other: Signs of right frontal hematoma. Small hematoma without underlying fracture. CT CERVICAL SPINE FINDINGS Alignment: Normal. Skull base and  vertebrae: No acute fracture. No primary bone lesion or focal pathologic process. Soft tissues and spinal canal: No prevertebral fluid or swelling. No visible canal hematoma. Disc levels: Multilevel degenerative changes in the spine greatest at C5-6 and C6-7 with mild to moderate  anterior osteophytes and uncovertebral degenerative changes. Upper chest: Negative. Other: None IMPRESSION: 1. No CT evidence for acute intracranial process. 2. Signs of chronic microvascular ischemic change and atrophy. 3. Signs of right frontal hematoma without underlying fracture. 4. No fracture or malalignment in the cervical spine. Electronically Signed   By: Zetta Bills M.D.   On: 09/17/2019 14:36       Assessment & Plan:   Problem List Items Addressed This Visit    Essential hypertension, benign - Primary    Blood pressure cuff correlates. Outside readings reviewed.  Doing well on outside checks.  Continue lisinopril 10mg  q day and amlodipine 2.5mg  q day.  Follow pressures.  Follow metabolic panel.       Relevant Orders   Basic metabolic panel (Completed)   Hypercholesterolemia    Discussed calculated cholesterol risk.  Discussed recommendation for cholesterol medication.  She declines.  Low cholesterol diet and exercise.  Follow lipid panel.       Hypothyroidism    On thyroid replacement.  Follow tsh.       Relevant Orders   TSH (Completed)   Vaginal lesion    bactroban ointment as directed.  Follow.           Einar Pheasant, MD

## 2020-08-19 ENCOUNTER — Encounter: Payer: Self-pay | Admitting: Internal Medicine

## 2020-08-19 ENCOUNTER — Other Ambulatory Visit: Payer: Self-pay | Admitting: Internal Medicine

## 2020-08-19 DIAGNOSIS — N898 Other specified noninflammatory disorders of vagina: Secondary | ICD-10-CM | POA: Insufficient documentation

## 2020-08-19 NOTE — Assessment & Plan Note (Signed)
Blood pressure cuff correlates. Outside readings reviewed.  Doing well on outside checks.  Continue lisinopril 10mg  q day and amlodipine 2.5mg  q day.  Follow pressures.  Follow metabolic panel.

## 2020-08-19 NOTE — Assessment & Plan Note (Signed)
Discussed calculated cholesterol risk.  Discussed recommendation for cholesterol medication.  She declines.  Low cholesterol diet and exercise.  Follow lipid panel.

## 2020-08-19 NOTE — Assessment & Plan Note (Signed)
bactroban ointment as directed.  Follow.  

## 2020-08-19 NOTE — Assessment & Plan Note (Signed)
On thyroid replacement.  Follow tsh.  

## 2020-10-17 ENCOUNTER — Other Ambulatory Visit: Payer: Self-pay | Admitting: Internal Medicine

## 2020-10-24 IMAGING — CR DG WRIST COMPLETE 3+V*R*
4 series · 4 of 4 positions shown · non-contrast
Comparison: None.

CLINICAL DATA: Status post fall, right wrist pain

EXAM:
RIGHT WRIST - COMPLETE 3+ VIEW

[wrist pa]
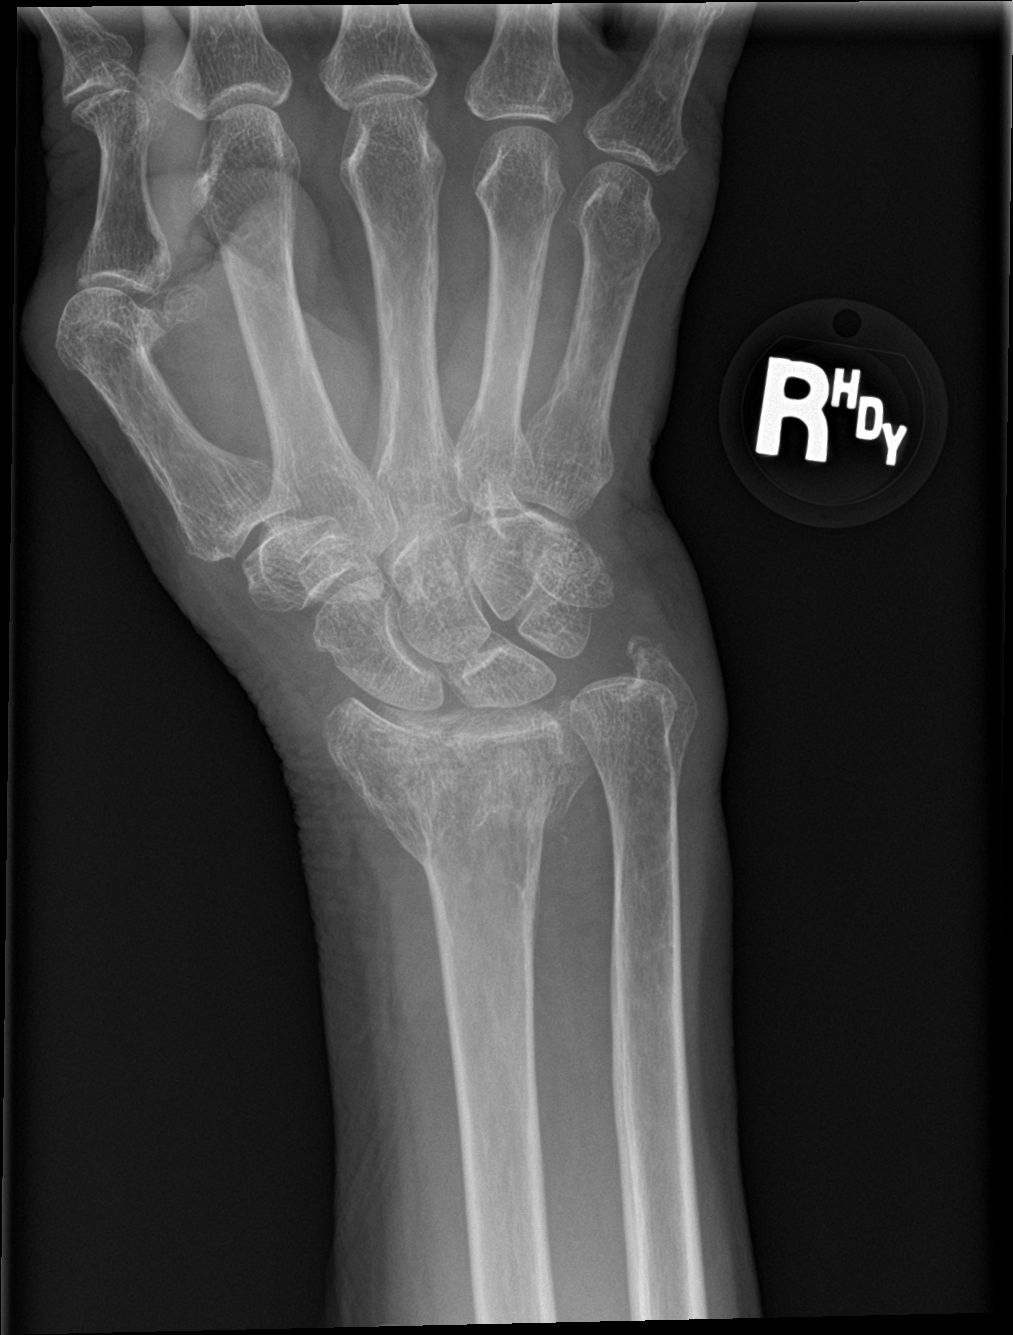

[wrist obl]
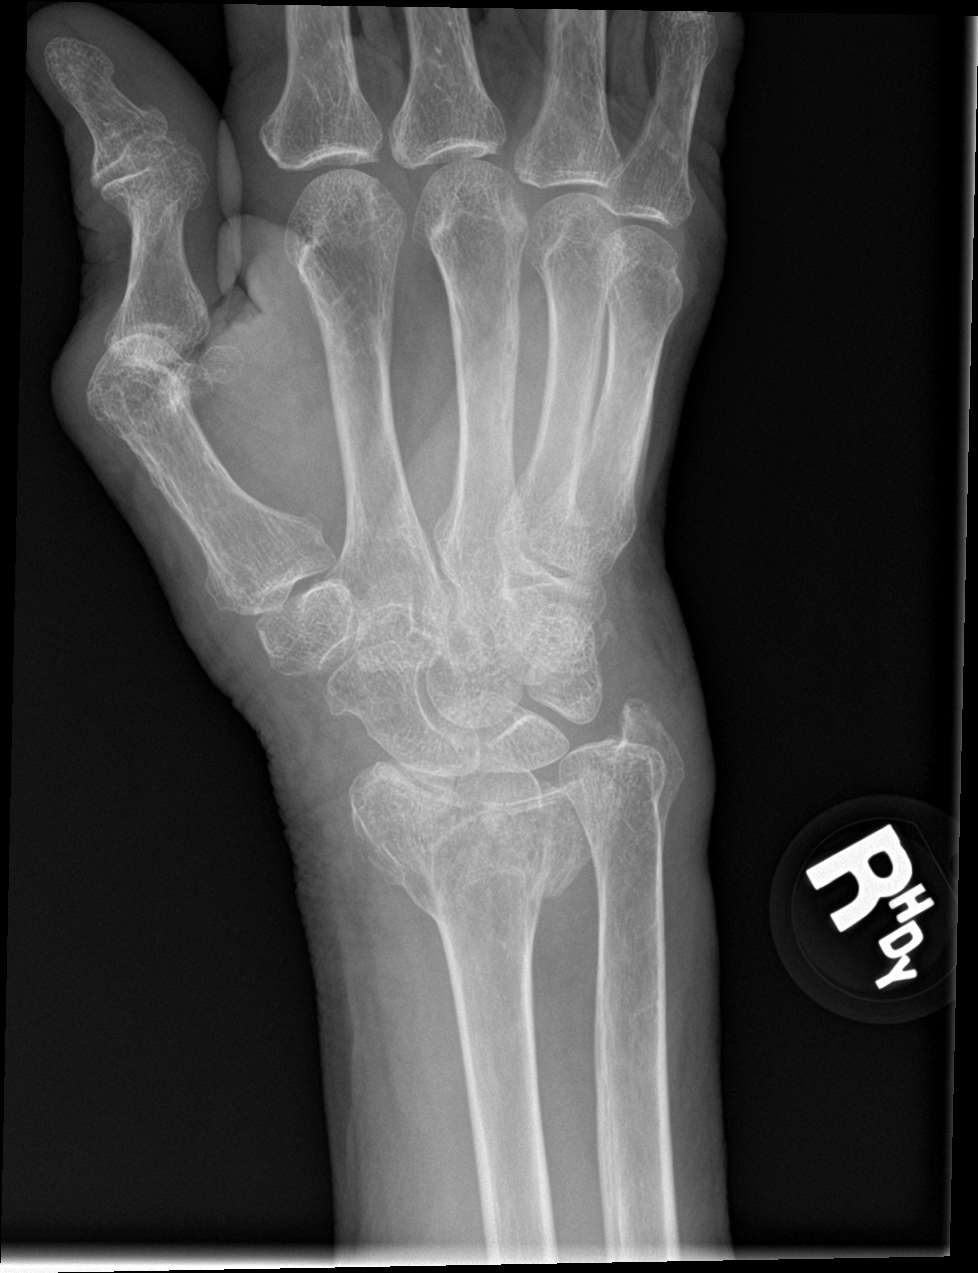

[wrist lat]
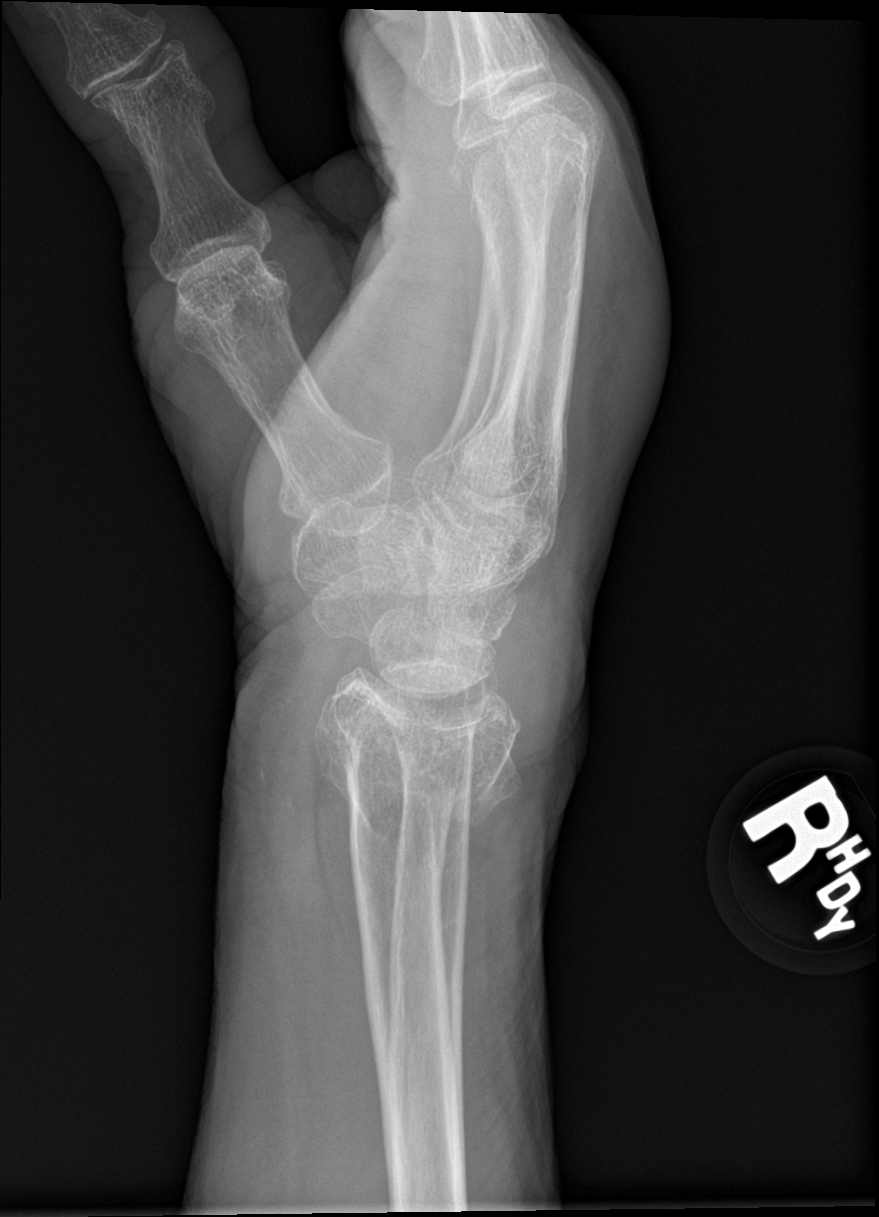

[navicular]
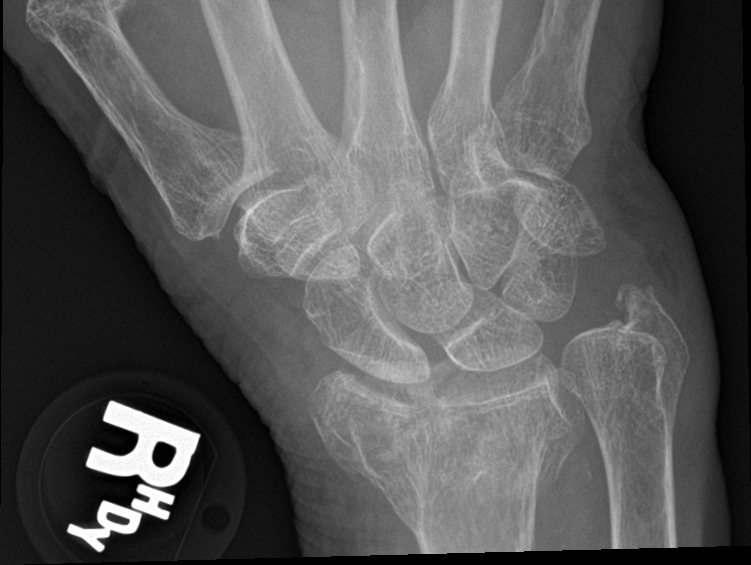

[4 of 4 positions shown; findings below may reference images not displayed]

FINDINGS: Generalized osteopenia.

Comminuted and impacted distal radial metaphysis fracture extending
to the articular surface of the radiocarpal joint.

Nondisplaced fracture of the ulnar styloid process.

Mild osteoarthritis of the first CMC joint, first MCP joint and
first IP joint. Soft tissue swelling around the right wrist.
IMPRESSION: 1. Comminuted and impacted distal radial metaphysis fracture
extending to the articular surface of the radiocarpal joint.
2. Nondisplaced fracture of the ulnar styloid process.

## 2020-11-04 ENCOUNTER — Other Ambulatory Visit: Payer: Self-pay | Admitting: Internal Medicine

## 2020-11-19 ENCOUNTER — Other Ambulatory Visit: Payer: Self-pay

## 2020-11-19 ENCOUNTER — Ambulatory Visit (INDEPENDENT_AMBULATORY_CARE_PROVIDER_SITE_OTHER): Payer: Medicare HMO | Admitting: Internal Medicine

## 2020-11-19 ENCOUNTER — Encounter: Payer: Self-pay | Admitting: Internal Medicine

## 2020-11-19 VITALS — BP 128/78 | HR 96 | Temp 97.3°F | Resp 16 | Ht <= 58 in | Wt 128.8 lb

## 2020-11-19 DIAGNOSIS — I10 Essential (primary) hypertension: Secondary | ICD-10-CM | POA: Diagnosis not present

## 2020-11-19 DIAGNOSIS — E039 Hypothyroidism, unspecified: Secondary | ICD-10-CM

## 2020-11-19 DIAGNOSIS — Z9109 Other allergy status, other than to drugs and biological substances: Secondary | ICD-10-CM | POA: Diagnosis not present

## 2020-11-19 DIAGNOSIS — Z86010 Personal history of colonic polyps: Secondary | ICD-10-CM

## 2020-11-19 DIAGNOSIS — E7800 Pure hypercholesterolemia, unspecified: Secondary | ICD-10-CM | POA: Diagnosis not present

## 2020-11-19 LAB — COMPREHENSIVE METABOLIC PANEL
ALT: 13 U/L (ref 0–35)
AST: 18 U/L (ref 0–37)
Albumin: 4.2 g/dL (ref 3.5–5.2)
Alkaline Phosphatase: 57 U/L (ref 39–117)
BUN: 20 mg/dL (ref 6–23)
CO2: 26 mEq/L (ref 19–32)
Calcium: 9.4 mg/dL (ref 8.4–10.5)
Chloride: 105 mEq/L (ref 96–112)
Creatinine, Ser: 1.1 mg/dL (ref 0.40–1.20)
GFR: 49.94 mL/min — ABNORMAL LOW (ref >60.00)
Glucose, Bld: 100 mg/dL — ABNORMAL HIGH (ref 70–99)
Potassium: 4.3 mEq/L (ref 3.5–5.1)
Sodium: 138 mEq/L (ref 135–145)
Total Bilirubin: 0.6 mg/dL (ref 0.2–1.2)
Total Protein: 7.2 g/dL (ref 6.0–8.3)

## 2020-11-19 LAB — LIPID PANEL
Cholesterol: 182 mg/dL (ref 0–200)
HDL: 44.9 mg/dL (ref >39.00)
LDL Cholesterol: 113 mg/dL — ABNORMAL HIGH (ref 0–99)
NonHDL: 137.1
Total CHOL/HDL Ratio: 4
Triglycerides: 123 mg/dL (ref 0.0–149.0)
VLDL: 24.6 mg/dL (ref 0.0–40.0)

## 2020-11-19 MED ORDER — AMLODIPINE BESYLATE 2.5 MG PO TABS: 2.5000 mg | ORAL_TABLET | Freq: Every day | ORAL | 1 refills | Status: DC

## 2020-11-19 MED ORDER — SYNTHROID 75 MCG PO TABS: 75.0000 ug | ORAL_TABLET | Freq: Every day | ORAL | 5 refills | Status: DC

## 2020-11-19 MED ORDER — SYNTHROID 75 MCG PO TABS: 75.0000 ug | ORAL_TABLET | Freq: Every day | ORAL | 1 refills | Status: DC

## 2020-11-19 NOTE — Progress Notes (Signed)
Patient ID: Ann Perkins, female   DOB: [birth year], 75 y.o.   MRN: [id number]   Subjective:    Patient ID: Ann Perkins, female    DOB: [birth year], 75 y.o.   MRN: [id number]  HPI This visit occurred during the SARS-CoV-2 public health emergency.  Safety protocols were in place, including screening questions prior to the visit, additional usage of staff PPE, and extensive cleaning of exam room while observing appropriate contact time as indicated for disinfecting solutions.  Patient here for a scheduled follow up.  Here to follow up regarding her blood pressure, hypothyroidism and cholesterol.  She brought in blood pressure readings from home - 103/62, 125/75, 125/71 and 111/68.  Tolerating the medication.  No headache.  No dizziness.  No chest pain or sob reported.  No abdominal pain.  Bowels moving.  Occasional leg cramp.  Occurs when sitting in a certain position with leg propped up.  Occasionally will notice sitting in car.  Not a persistent problem for her.  Discussed staying hydrated, stretches and avoid putting leg in a strained position.    Past Medical History:  Diagnosis Date  . Allergy   . GERD (gastroesophageal reflux disease)   . Hemorrhoids   . History of colon polyps   . Hypertension   . Thyroid disease    Past Surgical History:  Procedure Laterality Date  . COLONOSCOPY  2016  . MOUTH SURGERY     x 2  . TONSILLECTOMY     Family History  Problem Relation Age of Onset  . Hyperlipidemia Mother   . Heart disease Mother   . Diabetes Mother   . Stroke Father   . Colon cancer Brother        Half(Father Side)  . Colon cancer Sister        x 2 Half(Father Side)  . Colon cancer Cousin        Mother Side   . Esophageal cancer Neg Hx   . Rectal cancer Neg Hx   . Stomach cancer Neg Hx    Social History   Socioeconomic History  . Marital status: Single    Spouse name: Not on file  . Number of children: Not on file  . Years of education: Not on file  . Highest education  level: Not on file  Occupational History  . Occupation: Retired  Tobacco Use  . Smoking status: Never Smoker  . Smokeless tobacco: Never Used  Vaping Use  . Vaping Use: Never used  Substance and Sexual Activity  . Alcohol use: No    Alcohol/week: 0.0 standard drinks  . Drug use: No  . Sexual activity: Not on file  Other Topics Concern  . Not on file  Social History Narrative  . Not on file   Social Determinants of Health   Financial Resource Strain: Not on file  Food Insecurity: Not on file  Transportation Needs: Not on file  Physical Activity: Not on file  Stress: Not on file  Social Connections: Not on file    Outpatient Encounter Medications as of 11/19/2020  Medication Sig  . lisinopril (ZESTRIL) 10 MG tablet TAKE ONE TABLET BY MOUTH TWICE DAILY, INTHE MORNING AND AT BEDTIME.  . mupirocin ointment (BACTROBAN) 2 % Apply 1 application topically 2 (two) times daily.  . sodium chloride (OCEAN) 0.65 % nasal spray Place 1 spray into the nose as needed for congestion.  . [DISCONTINUED] amLODipine (NORVASC) 2.5 MG tablet TAKE 1 TABLET BY MOUTH DAILY  . [DISCONTINUED] SYNTHROID  75 MCG tablet TAKE 1 TABLET BY MOUTH DAILY  . amLODipine (NORVASC) 2.5 MG tablet Take 1 tablet (2.5 mg total) by mouth daily.  Marland Kitchen SYNTHROID 75 MCG tablet Take 1 tablet (75 mcg total) by mouth daily.  . [DISCONTINUED] SYNTHROID 75 MCG tablet Take 1 tablet (75 mcg total) by mouth daily.   Facility-Administered Encounter Medications as of 11/19/2020  Medication  . 0.9 %  sodium chloride infusion    Review of Systems  Constitutional: Negative for appetite change and unexpected weight change.  HENT: Negative for congestion.   Respiratory: Negative for cough, chest tightness and shortness of breath.   Cardiovascular: Negative for chest pain, palpitations and leg swelling.  Gastrointestinal: Negative for abdominal pain, diarrhea, nausea and vomiting.  Genitourinary: Negative for difficulty urinating and  dysuria.  Musculoskeletal: Negative for joint swelling and myalgias.  Skin: Negative for color change and rash.  Neurological: Negative for dizziness, light-headedness and headaches.  Psychiatric/Behavioral: Negative for agitation and dysphoric mood.       Objective:    Physical Exam Vitals reviewed.  Constitutional:      General: She is not in acute distress.    Appearance: Normal appearance.  HENT:     Head: Normocephalic and atraumatic.     Right Ear: External ear normal.  Eyes:     General: No scleral icterus.       Right eye: No discharge.        Left eye: No discharge.     Conjunctiva/sclera: Conjunctivae normal.  Neck:     Thyroid: No thyromegaly.  Cardiovascular:     Rate and Rhythm: Normal rate and regular rhythm.  Pulmonary:     Effort: No respiratory distress.     Breath sounds: Normal breath sounds. No wheezing.  Abdominal:     General: Bowel sounds are normal.     Palpations: Abdomen is soft.     Tenderness: There is no abdominal tenderness.  Musculoskeletal:        General: No swelling or tenderness.     Cervical back: Neck supple. No tenderness.  Lymphadenopathy:     Cervical: No cervical adenopathy.  Skin:    Findings: No erythema or rash.  Neurological:     Mental Status: She is alert.  Psychiatric:        Mood and Affect: Mood normal.        Behavior: Behavior normal.     BP 128/78   Pulse 96   Temp (!) 97.3 F (36.3 C)   Resp 16   Ht 4\' 9"  (1.448 m)   Wt 128 lb 12.8 oz (58.4 kg)   LMP 07/21/1991   SpO2 99%   BMI 27.87 kg/m  Wt Readings from Last 3 Encounters:  11/19/20 128 lb 12.8 oz (58.4 kg)  08/14/20 128 lb (58.1 kg)  06/25/20 129 lb (58.5 kg)     Lab Results  Component Value Date   WBC 5.2 02/13/2020   HGB 13.0 02/13/2020   HCT 38.6 02/13/2020   PLT 193.0 02/13/2020   GLUCOSE 100 (H) 11/19/2020   CHOL 182 11/19/2020   TRIG 123.0 11/19/2020   HDL 44.90 11/19/2020   LDLCALC 113 (H) 11/19/2020   ALT 13 11/19/2020   AST  18 11/19/2020   NA 138 11/19/2020   K 4.3 11/19/2020   CL 105 11/19/2020   CREATININE 1.10 11/19/2020   BUN 20 11/19/2020   CO2 26 11/19/2020   TSH 0.68 08/14/2020    DG Ribs Unilateral W/Chest  Right  Result Date: 09/17/2019 CLINICAL DATA:  Right rib pain. EXAM: RIGHT RIBS AND CHEST - 3+ VIEW COMPARISON:  None. FINDINGS: No fracture or other bone lesions are seen involving the ribs. There is no evidence of pneumothorax or pleural effusion. Both lungs are clear. Heart size and mediastinal contours are within normal limits. IMPRESSION: Negative. Electronically Signed   By: Kathreen Devoid   On: 09/17/2019 14:26   DG Wrist Complete Right  Result Date: 09/17/2019 CLINICAL DATA:  Status post fall, right wrist pain EXAM: RIGHT WRIST - COMPLETE 3+ VIEW COMPARISON:  None. FINDINGS: Generalized osteopenia. Comminuted and impacted distal radial metaphysis fracture extending to the articular surface of the radiocarpal joint. Nondisplaced fracture of the ulnar styloid process. Mild osteoarthritis of the first [facility] joint, first MCP joint and first IP joint. Soft tissue swelling around the right wrist. IMPRESSION: 1. Comminuted and impacted distal radial metaphysis fracture extending to the articular surface of the radiocarpal joint. 2. Nondisplaced fracture of the ulnar styloid process. Electronically Signed   By: Kathreen Devoid   On: 09/17/2019 14:28   CT Head Wo Contrast  Result Date: 09/17/2019 CLINICAL DATA:  Poly trauma head and C-spine injury. EXAM: CT HEAD WITHOUT CONTRAST CT CERVICAL SPINE WITHOUT CONTRAST TECHNIQUE: Multidetector CT imaging of the head and cervical spine was performed following the standard protocol without intravenous contrast. Multiplanar CT image reconstructions of the cervical spine were also generated. COMPARISON:  None FINDINGS: CT HEAD FINDINGS Brain: No evidence of acute infarction, hemorrhage, hydrocephalus, extra-axial collection or mass lesion/mass effect. Signs of chronic  microvascular ischemic change and atrophy. Vascular: No hyperdense vessel or unexpected calcification. Skull: Normal. Negative for fracture or focal lesion. Sinuses/Orbits: No acute finding. Other: Signs of right frontal hematoma. Small hematoma without underlying fracture. CT CERVICAL SPINE FINDINGS Alignment: Normal. Skull base and vertebrae: No acute fracture. No primary bone lesion or focal pathologic process. Soft tissues and spinal canal: No prevertebral fluid or swelling. No visible canal hematoma. Disc levels: Multilevel degenerative changes in the spine greatest at C5-6 and C6-7 with mild to moderate anterior osteophytes and uncovertebral degenerative changes. Upper chest: Negative. Other: None IMPRESSION: 1. No CT evidence for acute intracranial process. 2. Signs of chronic microvascular ischemic change and atrophy. 3. Signs of right frontal hematoma without underlying fracture. 4. No fracture or malalignment in the cervical spine. Electronically Signed   By: Zetta Bills M.D.   On: 09/17/2019 14:36   CT Cervical Spine Wo Contrast  Result Date: 09/17/2019 CLINICAL DATA:  Poly trauma head and C-spine injury. EXAM: CT HEAD WITHOUT CONTRAST CT CERVICAL SPINE WITHOUT CONTRAST TECHNIQUE: Multidetector CT imaging of the head and cervical spine was performed following the standard protocol without intravenous contrast. Multiplanar CT image reconstructions of the cervical spine were also generated. COMPARISON:  None FINDINGS: CT HEAD FINDINGS Brain: No evidence of acute infarction, hemorrhage, hydrocephalus, extra-axial collection or mass lesion/mass effect. Signs of chronic microvascular ischemic change and atrophy. Vascular: No hyperdense vessel or unexpected calcification. Skull: Normal. Negative for fracture or focal lesion. Sinuses/Orbits: No acute finding. Other: Signs of right frontal hematoma. Small hematoma without underlying fracture. CT CERVICAL SPINE FINDINGS Alignment: Normal. Skull base and  vertebrae: No acute fracture. No primary bone lesion or focal pathologic process. Soft tissues and spinal canal: No prevertebral fluid or swelling. No visible canal hematoma. Disc levels: Multilevel degenerative changes in the spine greatest at C5-6 and C6-7 with mild to moderate anterior osteophytes and uncovertebral degenerative changes. Upper chest: Negative. Other:  None IMPRESSION: 1. No CT evidence for acute intracranial process. 2. Signs of chronic microvascular ischemic change and atrophy. 3. Signs of right frontal hematoma without underlying fracture. 4. No fracture or malalignment in the cervical spine. Electronically Signed   By: Zetta Bills M.D.   On: 09/17/2019 14:36       Assessment & Plan:   Problem List Items Addressed This Visit    Environmental allergies    Saline nasal spray.  Steroid nasal spray.  Follow.       Essential hypertension, benign    Blood pressure as outlined.  Continues on lisinopril and amlodipine 2.5mg  q day.  Blood pressure dong better.  Continue to follow pressures.  Follow metabolic panel.        Relevant Medications   amLODipine (NORVASC) 2.5 MG tablet   Other Relevant Orders   Comprehensive metabolic panel (Completed)   History of colonic polyps    Colonoscopy 12/2017 - Dr Hilarie Fredrickson.  Continue f/u with GI.       Hypercholesterolemia - Primary    Have discussed calculated cholesterol risk.  Have discussed recommendation to start a cholesterol medication.  She declines.  Low cholesterol diet and exercise.  Follow lipid panel and liver function tests.        Relevant Medications   amLODipine (NORVASC) 2.5 MG tablet   Other Relevant Orders   Lipid panel (Completed)   Hypothyroidism    On thyroid replacement.  Follow tsh.       Relevant Medications   SYNTHROID 75 MCG tablet       Einar Pheasant, MD

## 2020-11-24 ENCOUNTER — Encounter: Payer: Self-pay | Admitting: Internal Medicine

## 2020-11-24 NOTE — Assessment & Plan Note (Signed)
Have discussed calculated cholesterol risk.  Have discussed recommendation to start a cholesterol medication.  She declines.  Low cholesterol diet and exercise.  Follow lipid panel and liver function tests.   

## 2020-11-24 NOTE — Assessment & Plan Note (Signed)
On thyroid replacement.  Follow tsh.  

## 2020-11-24 NOTE — Assessment & Plan Note (Signed)
Blood pressure as outlined.  Continues on lisinopril and amlodipine 2.5mg  q day.  Blood pressure dong better.  Continue to follow pressures.  Follow metabolic panel.

## 2020-11-24 NOTE — Assessment & Plan Note (Signed)
Saline nasal spray.  Steroid nasal spray.  Follow.

## 2020-11-24 NOTE — Assessment & Plan Note (Signed)
Colonoscopy 12/2017 - Dr Hilarie Fredrickson.  Continue f/u with GI.

## 2021-01-07 ENCOUNTER — Other Ambulatory Visit: Payer: Self-pay | Admitting: Internal Medicine

## 2021-01-21 ENCOUNTER — Telehealth: Payer: Self-pay | Admitting: Internal Medicine

## 2021-01-21 NOTE — Telephone Encounter (Signed)
Tried to reach patient line busy.

## 2021-01-21 NOTE — Telephone Encounter (Signed)
PT called to speak to [place]. PT stated she has questions about her BP due to she been feeling lightheaded. She states she also takes 2 BP meds one she hasnt taken since Sat which is amLODipine (NORVASC) 2.5 MG tablet. She has still been taking her lisinopril (ZESTRIL) 10 MG tablet and would like to talk to [place].

## 2021-01-22 NOTE — Telephone Encounter (Signed)
Agree with needing evaluation to confirm nothing more acute going on.
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
## 2021-01-22 NOTE — Telephone Encounter (Signed)
Called pt back. Pt stated she no longer wanted to do the walk-in and asked to do the Friday appt at [facility]. Pt has been scheduled

## 2021-01-22 NOTE — Telephone Encounter (Signed)
Pt aware of below.

## 2021-01-22 NOTE — Telephone Encounter (Signed)
Pt called and wanted to discuss message below

## 2021-01-22 NOTE — Telephone Encounter (Signed)
PT called again to advise she is not taking her amLODipine (NORVASC) 2.5 MG tablet and states that if she does not answer when they call to please leave a message.

## 2021-01-24 ENCOUNTER — Other Ambulatory Visit: Payer: Self-pay

## 2021-01-24 ENCOUNTER — Encounter: Payer: Self-pay | Admitting: Primary Care

## 2021-01-24 ENCOUNTER — Ambulatory Visit (INDEPENDENT_AMBULATORY_CARE_PROVIDER_SITE_OTHER): Payer: Medicare HMO | Admitting: Primary Care

## 2021-01-24 ENCOUNTER — Telehealth: Payer: Self-pay | Admitting: Internal Medicine

## 2021-01-24 VITALS — BP 160/90 | HR 110 | Temp 98.4°F | Ht <= 58 in | Wt 129.0 lb

## 2021-01-24 DIAGNOSIS — R42 Dizziness and giddiness: Secondary | ICD-10-CM

## 2021-01-24 DIAGNOSIS — I10 Essential (primary) hypertension: Secondary | ICD-10-CM

## 2021-01-24 LAB — POC URINALSYSI DIPSTICK (AUTOMATED)
Bilirubin, UA: NEGATIVE
Blood, UA: NEGATIVE
Glucose, UA: NEGATIVE
Ketones, UA: NEGATIVE
Leukocytes, UA: NEGATIVE
Nitrite, UA: NEGATIVE
Protein, UA: NEGATIVE
Spec Grav, UA: 1.015 (ref 1.010–1.025)
Urobilinogen, UA: 0.2 E.U./dL
pH, UA: 7 (ref 5.0–8.0)

## 2021-01-24 LAB — CBC WITH DIFFERENTIAL/PLATELET
Basophils Absolute: 0 10*3/uL (ref 0.0–0.1)
Basophils Relative: 0.4 % (ref 0.0–3.0)
Eosinophils Absolute: 0.1 10*3/uL (ref 0.0–0.7)
Eosinophils Relative: 1 % (ref 0.0–5.0)
HCT: 41.9 % (ref 36.0–46.0)
Hemoglobin: 14.1 g/dL (ref 12.0–15.0)
Lymphocytes Relative: 23.2 % (ref 12.0–46.0)
Lymphs Abs: 1.5 10*3/uL (ref 0.7–4.0)
MCHC: 33.6 g/dL (ref 30.0–36.0)
MCV: 89.6 fl (ref 78.0–100.0)
Monocytes Absolute: 0.5 10*3/uL (ref 0.1–1.0)
Monocytes Relative: 7.7 % (ref 3.0–12.0)
Neutro Abs: 4.5 10*3/uL (ref 1.4–7.7)
Neutrophils Relative %: 67.7 % (ref 43.0–77.0)
Platelets: 217 10*3/uL (ref 150.0–400.0)
RBC: 4.67 Mil/uL (ref 3.87–5.11)
RDW: 13.5 % (ref 11.5–15.5)
WBC: 6.6 10*3/uL (ref 4.0–10.5)

## 2021-01-24 LAB — BASIC METABOLIC PANEL
BUN: 14 mg/dL (ref 6–23)
CO2: 29 mEq/L (ref 19–32)
Calcium: 9.7 mg/dL (ref 8.4–10.5)
Chloride: 103 mEq/L (ref 96–112)
Creatinine, Ser: 1.14 mg/dL (ref 0.40–1.20)
GFR: 47.78 mL/min — ABNORMAL LOW (ref >60.00)
Glucose, Bld: 96 mg/dL (ref 70–99)
Potassium: 4 mEq/L (ref 3.5–5.1)
Sodium: 141 mEq/L (ref 135–145)

## 2021-01-24 LAB — TSH: TSH: 0.55 u[IU]/mL (ref 0.35–5.50)

## 2021-01-24 NOTE — Progress Notes (Signed)
Subjective:    Patient ID: Ann Perkins, female    DOB: 24-Feb-1946, 75 y.o.   MRN: [id number]  Ann Perkins is a very pleasant 75 y.o. female patient of Dr. Nicki Reaper with a history of hypertension, hypothyroidism, rectal bleeding who presents today to discuss dizziness.   Her lightheadedness is intermittent, which began about 1-2 weeks ago, occurs when she turns her head, feels unsteady when walking. She feels "like my head flips at times". She is up to date with her eye exam, due in September 2022.   She has noticed right ear pain last night. She has chronic allergies, has nasal congestion every morning. Uses Saline nasal spray.   She denies chest, pain, shortness of breath, headaches, rectal bleeding, falls, abrupt visual changes.   She stopped her amlodipine last week as she thought this was contributing to her symptoms, she resumed two days ago after she saw elevated readings at home.   She doesn't drink much water, has been trying to increase over the last few days. She's been outdoors in the heat a few times. She denies CAD or cardiac history.   BP Readings from Last 3 Encounters:  01/24/21 (!) 160/90  11/19/20 128/78  08/19/20 (!) 148/80        Review of Systems  Eyes:  Negative for visual disturbance.  Respiratory:  Negative for shortness of breath.   Cardiovascular:  Negative for chest pain.  Gastrointestinal:  Negative for blood in stool.  Neurological:  Positive for light-headedness. Negative for headaches.        Past Medical History:  Diagnosis Date   Allergy    GERD (gastroesophageal reflux disease)    Hemorrhoids    History of colon polyps    Hypertension    Thyroid disease     Social History   Socioeconomic History   Marital status: Single    Spouse name: Not on file   Number of children: Not on file   Years of education: Not on file   Highest education level: Not on file  Occupational History   Occupation: Retired  Tobacco Use   Smoking  status: Never   Smokeless tobacco: Never  Vaping Use   Vaping Use: Never used  Substance and Sexual Activity   Alcohol use: No    Alcohol/week: 0.0 standard drinks   Drug use: No   Sexual activity: Not on file  Other Topics Concern   Not on file  Social History Narrative   Not on file   Social Determinants of Health   Financial Resource Strain: Not on file  Food Insecurity: Not on file  Transportation Needs: Not on file  Physical Activity: Not on file  Stress: Not on file  Social Connections: Not on file  Intimate Partner Violence: Not on file    Past Surgical History:  Procedure Laterality Date   COLONOSCOPY  2016   MOUTH SURGERY     x 2   TONSILLECTOMY      Family History  Problem Relation Age of Onset   Hyperlipidemia Mother    Heart disease Mother    Diabetes Mother    Stroke Father    Colon cancer Brother        Half(Father Side)   Colon cancer Sister        x 2 Half(Father Side)   Colon cancer Cousin        Mother Side    Esophageal cancer Neg Hx    Rectal cancer Neg Hx  Stomach cancer Neg Hx     Allergies  Allergen Reactions   Erythromycin Diarrhea    Upset stomach    Current Outpatient Medications on File Prior to Visit  Medication Sig Dispense Refill   amLODipine (NORVASC) 2.5 MG tablet Take 1 tablet (2.5 mg total) by mouth daily. 90 tablet 1   lisinopril (ZESTRIL) 10 MG tablet TAKE ONE TABLET BY MOUTH TWICE DAILY, INTHE MORNING AND AT BEDTIME. 60 tablet 2   sodium chloride (OCEAN) 0.65 % nasal spray Place 1 spray into the nose as needed for congestion.     SYNTHROID 75 MCG tablet Take 1 tablet (75 mcg total) by mouth daily. 30 tablet 5   Current Facility-Administered Medications on File Prior to Visit  Medication Dose Route Frequency Provider Last Rate Last Admin   0.9 %  sodium chloride infusion  500 mL Intravenous Once Pyrtle, Lajuan Lines, MD        BP (!) 160/90   Pulse (!) 110   Temp 98.4 F (36.9 C) (Temporal)   Ht 4\' 9"  (1.448 m)    Wt 129 lb (58.5 kg)   LMP 07/21/1991   SpO2 97%   BMI 27.92 kg/m  Objective:   Physical Exam Eyes:     Extraocular Movements: Extraocular movements intact.  Cardiovascular:     Rate and Rhythm: Normal rate and regular rhythm.  Pulmonary:     Effort: Pulmonary effort is normal.     Breath sounds: Normal breath sounds.  Musculoskeletal:     Cervical back: Neck supple.  Skin:    General: Skin is warm and dry.  Neurological:     Cranial Nerves: No cranial nerve deficit.     Motor: No weakness.     Coordination: Coordination normal.     Gait: Gait normal.          Assessment & Plan:      This visit occurred during the SARS-CoV-2 public health emergency.  Safety protocols were in place, including screening questions prior to the visit, additional usage of staff PPE, and extensive cleaning of exam room while observing appropriate contact time as indicated for disinfecting solutions.

## 2021-01-24 NOTE — Telephone Encounter (Signed)
Ms Ohmer was seen by [name] for dizziness.  Please call and see how she is doing.  If persistent problems, will need to be reevaluated.

## 2021-01-24 NOTE — Patient Instructions (Signed)
Stop by the lab prior to leaving today. I will notify you of your results once received.   For the lightheadedness: Try using Flonase (fluticasone) nasal spray. Instill 1 spray in each nostril twice daily.   Increase your intake of water.   It was a pleasure meeting you!

## 2021-01-24 NOTE — Assessment & Plan Note (Signed)
Above goal in the office today, however, she just resumed amlodipine two days ago.   Encouraged daily compliance.  Orthostatic vitals negative.

## 2021-01-24 NOTE — Assessment & Plan Note (Addendum)
Acute and intermittent x 1-2 weeks.  Neuro exam today unremarkable. She is able to ascend and descend from the exam table without difficulty. Ambulates well in clinic.   No alarm signs during HPI.   Differentials include cardiac cause, inner ear disorder, UTI, hypertension.   ECG today with RBBB, PVC, LVH. No acute ST changes. No murmur on exam. May need echo in the future.   Checking labs. UA today negative.   Discussed trial of Flonase, she has this at home. Encouraged good water intake.   Will cc to PCP for any thoughts.

## 2021-01-27 ENCOUNTER — Telehealth: Payer: Self-pay

## 2021-01-27 NOTE — Telephone Encounter (Signed)
See lab results for further documentation.  

## 2021-01-27 NOTE — Telephone Encounter (Signed)
New message    Patient returning call from Friday .

## 2021-01-28 NOTE — Progress Notes (Signed)
Patient says she is "feeling much better and her head does not seem to be bothering me"bright now" patient is scheduled for follow up with PCP 02/25/21 . Patient has questions about her EKG done at visit with Carlis Abbott NP.  I have printed EKG for you, patient said NP advised she may need an Echo cardiogram.

## 2021-01-28 NOTE — Telephone Encounter (Signed)
Called and spoke with Atiya. Sharalee states that she is feeling better since being seen.

## 2021-01-28 NOTE — Telephone Encounter (Signed)
Left message to call back  

## 2021-01-30 ENCOUNTER — Other Ambulatory Visit: Payer: Self-pay | Admitting: Internal Medicine

## 2021-01-30 DIAGNOSIS — I4510 Unspecified right bundle-branch block: Secondary | ICD-10-CM

## 2021-01-30 DIAGNOSIS — R42 Dizziness and giddiness: Secondary | ICD-10-CM

## 2021-01-30 NOTE — Progress Notes (Signed)
Order placed for cardiology referral.   

## 2021-01-31 ENCOUNTER — Telehealth: Payer: Self-pay | Admitting: Internal Medicine

## 2021-01-31 NOTE — Telephone Encounter (Signed)
PT called to advise that she would like to wait till her August 9th apt to discuss further the Heart Doctor before scheduling/setting it up.

## 2021-01-31 NOTE — Telephone Encounter (Signed)
FYI- patient would like to hold off on seeing cardiology right now. Has upcoming appt with you on 8/9 and would like to discuss this then.

## 2021-02-25 ENCOUNTER — Other Ambulatory Visit: Payer: Self-pay

## 2021-02-25 ENCOUNTER — Encounter: Payer: Self-pay | Admitting: Internal Medicine

## 2021-02-25 ENCOUNTER — Ambulatory Visit (INDEPENDENT_AMBULATORY_CARE_PROVIDER_SITE_OTHER): Payer: Medicare HMO | Admitting: Internal Medicine

## 2021-02-25 DIAGNOSIS — E7800 Pure hypercholesterolemia, unspecified: Secondary | ICD-10-CM | POA: Diagnosis not present

## 2021-02-25 DIAGNOSIS — E039 Hypothyroidism, unspecified: Secondary | ICD-10-CM | POA: Diagnosis not present

## 2021-02-25 DIAGNOSIS — Z86010 Personal history of colonic polyps: Secondary | ICD-10-CM | POA: Diagnosis not present

## 2021-02-25 DIAGNOSIS — I10 Essential (primary) hypertension: Secondary | ICD-10-CM

## 2021-02-25 DIAGNOSIS — N1831 Chronic kidney disease, stage 3a: Secondary | ICD-10-CM | POA: Diagnosis not present

## 2021-02-25 DIAGNOSIS — R42 Dizziness and giddiness: Secondary | ICD-10-CM | POA: Diagnosis not present

## 2021-02-25 DIAGNOSIS — Z9109 Other allergy status, other than to drugs and biological substances: Secondary | ICD-10-CM | POA: Diagnosis not present

## 2021-02-25 NOTE — Progress Notes (Signed)
Patient ID: Ann Perkins, female   DOB: 04-Jan-1946, 75 y.o.   MRN: [id number]   Subjective:    Patient ID: Ann Perkins, female    DOB: 21-Dec-1945, 75 y.o.   MRN: [id number]  HPI This visit occurred during the SARS-CoV-2 public health emergency.  Safety protocols were in place, including screening questions prior to the visit, additional usage of staff PPE, and extensive cleaning of exam room while observing appropriate contact time as indicated for disinfecting solutions.   Patient here for scheduled follow up.  Here to follow up regarding her blood pressure.  Also recently evaluated for an acute visit for light headedness.  She was evaluated by Ann Perkins.  Labs unrevealing.  Instructed to use flonase.  EKG - RBBB.  Recommended f/u with cardiology.  She comes in today stating she feels better.  No light headedness or dizziness.  No headache. No chest pain or sob reported.  No abdominal pain.  Bowels moving.  Eating.  No nausea or vomiting.  Some allergy symptoms - chronic drainage.    Past Medical History:  Diagnosis Date   Allergy    GERD (gastroesophageal reflux disease)    Hemorrhoids    History of colon polyps    Hypertension    Thyroid disease    Past Surgical History:  Procedure Laterality Date   COLONOSCOPY  2016   MOUTH SURGERY     x 2   TONSILLECTOMY     Family History  Problem Relation Age of Onset   Hyperlipidemia Mother    Heart disease Mother    Diabetes Mother    Stroke Father    Colon cancer Brother        Half(Father Side)   Colon cancer Sister        x 2 Half(Father Side)   Colon cancer Cousin        Mother Side    Esophageal cancer Neg Hx    Rectal cancer Neg Hx    Stomach cancer Neg Hx    Social History   Socioeconomic History   Marital status: Single    Spouse name: Not on file   Number of children: Not on file   Years of education: Not on file   Highest education level: Not on file  Occupational History   Occupation: Retired  Tobacco Use    Smoking status: Never   Smokeless tobacco: Never  Vaping Use   Vaping Use: Never used  Substance and Sexual Activity   Alcohol use: No    Alcohol/week: 0.0 standard drinks   Drug use: No   Sexual activity: Not on file  Other Topics Concern   Not on file  Social History Narrative   Not on file   Social Determinants of Health   Financial Resource Strain: Not on file  Food Insecurity: Not on file  Transportation Needs: Not on file  Physical Activity: Not on file  Stress: Not on file  Social Connections: Not on file    Review of Systems  Constitutional:  Negative for appetite change and unexpected weight change.  HENT:  Negative for congestion and sinus pressure.        Chronic post nasal drainage.    Respiratory:  Negative for cough, chest tightness and shortness of breath.   Cardiovascular:  Negative for chest pain, palpitations and leg swelling.  Gastrointestinal:  Negative for abdominal pain, diarrhea, nausea and vomiting.  Genitourinary:  Negative for difficulty urinating and dysuria.  Musculoskeletal:  Negative for joint swelling  and myalgias.  Skin:  Negative for color change and rash.  Neurological:  Negative for dizziness and headaches.       Previous light headedness as outlined.    Psychiatric/Behavioral:  Negative for agitation and dysphoric mood.       Objective:    Physical Exam Vitals reviewed.  Constitutional:      General: She is not in acute distress.    Appearance: Normal appearance.  HENT:     Head: Normocephalic and atraumatic.     Right Ear: External ear normal.     Left Ear: External ear normal.  Eyes:     General: No scleral icterus.       Right eye: No discharge.        Left eye: No discharge.     Conjunctiva/sclera: Conjunctivae normal.  Neck:     Thyroid: No thyromegaly.  Cardiovascular:     Rate and Rhythm: Normal rate and regular rhythm.  Pulmonary:     Effort: No respiratory distress.     Breath sounds: Normal breath sounds. No  wheezing.  Abdominal:     General: Bowel sounds are normal.     Palpations: Abdomen is soft.     Tenderness: There is no abdominal tenderness.  Musculoskeletal:        General: No swelling or tenderness.     Cervical back: Neck supple. No tenderness.  Lymphadenopathy:     Cervical: No cervical adenopathy.  Skin:    Findings: No erythema or rash.  Neurological:     Mental Status: She is alert.  Psychiatric:        Mood and Affect: Mood normal.        Behavior: Behavior normal.    BP 128/80   Pulse 79   Temp 97.9 F (36.6 C)   Resp 16   Ht '4\' 9"'$  (1.448 m)   Wt 127 lb 3.2 oz (57.7 kg)   LMP 07/21/1991   SpO2 98%   BMI 27.53 kg/m  Wt Readings from Last 3 Encounters:  02/25/21 127 lb 3.2 oz (57.7 kg)  01/24/21 129 lb (58.5 kg)  11/19/20 128 lb 12.8 oz (58.4 kg)    Outpatient Encounter Medications as of 02/25/2021  Medication Sig   fluticasone (FLONASE ALLERGY RELIEF) 50 MCG/ACT nasal spray Place into both nostrils daily as needed.   amLODipine (NORVASC) 2.5 MG tablet Take 1 tablet (2.5 mg total) by mouth daily.   lisinopril (ZESTRIL) 10 MG tablet TAKE ONE TABLET BY MOUTH TWICE DAILY, INTHE MORNING AND AT BEDTIME.   sodium chloride (OCEAN) 0.65 % nasal spray Place 1 spray into the nose as needed for congestion.   SYNTHROID 75 MCG tablet Take 1 tablet (75 mcg total) by mouth daily.   Facility-Administered Encounter Medications as of 02/25/2021  Medication   0.9 %  sodium chloride infusion     Lab Results  Component Value Date   WBC 6.6 01/24/2021   HGB 14.1 01/24/2021   HCT 41.9 01/24/2021   PLT 217.0 01/24/2021   GLUCOSE 96 01/24/2021   CHOL 182 11/19/2020   TRIG 123.0 11/19/2020   HDL 44.90 11/19/2020   LDLCALC 113 (H) 11/19/2020   ALT 13 11/19/2020   AST 18 11/19/2020   NA 141 01/24/2021   K 4.0 01/24/2021   CL 103 01/24/2021   CREATININE 1.14 01/24/2021   BUN 14 01/24/2021   CO2 29 01/24/2021   TSH 0.55 01/24/2021    DG Ribs Unilateral W/Chest  Right  Result  Date: 09/17/2019 CLINICAL DATA:  Right rib pain. EXAM: RIGHT RIBS AND CHEST - 3+ VIEW COMPARISON:  None. FINDINGS: No fracture or other bone lesions are seen involving the ribs. There is no evidence of pneumothorax or pleural effusion. Both lungs are clear. Heart size and mediastinal contours are within normal limits. IMPRESSION: Negative. Electronically Signed   By: Kathreen Devoid   On: 09/17/2019 14:26   DG Wrist Complete Right  Result Date: 09/17/2019 CLINICAL DATA:  Status post fall, right wrist pain EXAM: RIGHT WRIST - COMPLETE 3+ VIEW COMPARISON:  None. FINDINGS: Generalized osteopenia. Comminuted and impacted distal radial metaphysis fracture extending to the articular surface of the radiocarpal joint. Nondisplaced fracture of the ulnar styloid process. Mild osteoarthritis of the first [facility] joint, first MCP joint and first IP joint. Soft tissue swelling around the right wrist. IMPRESSION: 1. Comminuted and impacted distal radial metaphysis fracture extending to the articular surface of the radiocarpal joint. 2. Nondisplaced fracture of the ulnar styloid process. Electronically Signed   By: Kathreen Devoid   On: 09/17/2019 14:28   CT Head Wo Contrast  Result Date: 09/17/2019 CLINICAL DATA:  Poly trauma head and C-spine injury. EXAM: CT HEAD WITHOUT CONTRAST CT CERVICAL SPINE WITHOUT CONTRAST TECHNIQUE: Multidetector CT imaging of the head and cervical spine was performed following the standard protocol without intravenous contrast. Multiplanar CT image reconstructions of the cervical spine were also generated. COMPARISON:  None FINDINGS: CT HEAD FINDINGS Brain: No evidence of acute infarction, hemorrhage, hydrocephalus, extra-axial collection or mass lesion/mass effect. Signs of chronic microvascular ischemic change and atrophy. Vascular: No hyperdense vessel or unexpected calcification. Skull: Normal. Negative for fracture or focal lesion. Sinuses/Orbits: No acute finding. Other: Signs of  right frontal hematoma. Small hematoma without underlying fracture. CT CERVICAL SPINE FINDINGS Alignment: Normal. Skull base and vertebrae: No acute fracture. No primary bone lesion or focal pathologic process. Soft tissues and spinal canal: No prevertebral fluid or swelling. No visible canal hematoma. Disc levels: Multilevel degenerative changes in the spine greatest at C5-6 and C6-7 with mild to moderate anterior osteophytes and uncovertebral degenerative changes. Upper chest: Negative. Other: None IMPRESSION: 1. No CT evidence for acute intracranial process. 2. Signs of chronic microvascular ischemic change and atrophy. 3. Signs of right frontal hematoma without underlying fracture. 4. No fracture or malalignment in the cervical spine. Electronically Signed   By: Zetta Bills M.D.   On: 09/17/2019 14:36   CT Cervical Spine Wo Contrast  Result Date: 09/17/2019 CLINICAL DATA:  Poly trauma head and C-spine injury. EXAM: CT HEAD WITHOUT CONTRAST CT CERVICAL SPINE WITHOUT CONTRAST TECHNIQUE: Multidetector CT imaging of the head and cervical spine was performed following the standard protocol without intravenous contrast. Multiplanar CT image reconstructions of the cervical spine were also generated. COMPARISON:  None FINDINGS: CT HEAD FINDINGS Brain: No evidence of acute infarction, hemorrhage, hydrocephalus, extra-axial collection or mass lesion/mass effect. Signs of chronic microvascular ischemic change and atrophy. Vascular: No hyperdense vessel or unexpected calcification. Skull: Normal. Negative for fracture or focal lesion. Sinuses/Orbits: No acute finding. Other: Signs of right frontal hematoma. Small hematoma without underlying fracture. CT CERVICAL SPINE FINDINGS Alignment: Normal. Skull base and vertebrae: No acute fracture. No primary bone lesion or focal pathologic process. Soft tissues and spinal canal: No prevertebral fluid or swelling. No visible canal hematoma. Disc levels: Multilevel  degenerative changes in the spine greatest at C5-6 and C6-7 with mild to moderate anterior osteophytes and uncovertebral degenerative changes. Upper chest: Negative. Other: None IMPRESSION: 1.  No CT evidence for acute intracranial process. 2. Signs of chronic microvascular ischemic change and atrophy. 3. Signs of right frontal hematoma without underlying fracture. 4. No fracture or malalignment in the cervical spine. Electronically Signed   By: Zetta Bills M.D.   On: 09/17/2019 14:36       Assessment & Plan:   Problem List Items Addressed This Visit     CKD (chronic kidney disease) stage 3, GFR 30-59 ml/min (HCC)    01/24/21 - GFR 47.  Avoid antiinflammatories.  Stay hydrated.  Follow metabolic panel.        Environmental allergies    With increased drainage as outlined.  Flonase nasal spray as directed.  Follow.        Essential hypertension, benign    Blood pressure on recheck improved.  Continue lisinopril and amlodipine.  Follow pressures.  Follow metabolic panel.       Relevant Orders   Ambulatory referral to Cardiology   History of colonic polyps    Colonoscopy 12/2017 - Dr Hilarie Fredrickson.  Continue f/u with GI.       Hypercholesterolemia    Have discussed calculated cholesterol risk.  Have discussed recommendation to start a cholesterol medication.  She declines.  Low cholesterol diet and exercise.  Follow lipid panel and liver function tests.        Hypothyroidism    On thyroid replacement.  Follow tsh.       Lightheadedness    Recent evaluation for light headedness as outlined.  Was not orthostatic on exam.  EKG - RBBB.  Had recommended f/u with cardiology.  Discussed with her today.  She is agreeable for referral.        Relevant Orders   Ambulatory referral to Cardiology     Einar Pheasant, MD

## 2021-03-03 ENCOUNTER — Encounter: Payer: Self-pay | Admitting: Internal Medicine

## 2021-03-03 ENCOUNTER — Telehealth: Payer: Self-pay | Admitting: Internal Medicine

## 2021-03-03 DIAGNOSIS — N183 Chronic kidney disease, stage 3 unspecified: Secondary | ICD-10-CM | POA: Insufficient documentation

## 2021-03-03 NOTE — Assessment & Plan Note (Signed)
On thyroid replacement.  Follow tsh.  

## 2021-03-03 NOTE — Assessment & Plan Note (Signed)
With increased drainage as outlined.  Flonase nasal spray as directed.  Follow.

## 2021-03-03 NOTE — Assessment & Plan Note (Signed)
Blood pressure on recheck improved.  Continue lisinopril and amlodipine.  Follow pressures.  Follow metabolic panel.

## 2021-03-03 NOTE — Assessment & Plan Note (Signed)
Recent evaluation for light headedness as outlined.  Was not orthostatic on exam.  EKG - RBBB.  Had recommended f/u with cardiology.  Discussed with her today.  She is agreeable for referral.

## 2021-03-03 NOTE — Assessment & Plan Note (Signed)
01/24/21 - GFR 47.  Avoid antiinflammatories.  Stay hydrated.  Follow metabolic panel.

## 2021-03-03 NOTE — Assessment & Plan Note (Signed)
Have discussed calculated cholesterol risk.  Have discussed recommendation to start a cholesterol medication.  She declines.  Low cholesterol diet and exercise.  Follow lipid panel and liver function tests.   

## 2021-03-03 NOTE — Assessment & Plan Note (Signed)
Colonoscopy 12/2017 - Dr Hilarie Fredrickson.  Continue f/u with GI.

## 2021-03-03 NOTE — Telephone Encounter (Signed)
Patient called in and stated that her referral was sent [facility] cardiology and they are not able to set up an appointment with Dr.McLean.She would like for her referral to be sent to Dr.Dalton McLeans office at the heart failure clinic for them to schedule an appointment.

## 2021-03-04 NOTE — Telephone Encounter (Signed)
Discussed with Rasheedah. Referral was resent to afib clinic to be scheduled with Dr Aundra Dubin. Patient is aware and will call back if problems

## 2021-03-07 ENCOUNTER — Telehealth (HOSPITAL_COMMUNITY): Payer: Self-pay | Admitting: Cardiology

## 2021-03-07 NOTE — Telephone Encounter (Signed)
Patient called and said the referral needs to go to Dr Aundra Dubin at the [facility].

## 2021-03-07 NOTE — Telephone Encounter (Signed)
Pt called to f/u w/referral. Please advise

## 2021-03-13 NOTE — Telephone Encounter (Signed)
Pt not appropriate for AHF clinic, needs general cards, info faxed back to referring provider

## 2021-03-19 ENCOUNTER — Other Ambulatory Visit: Payer: Self-pay | Admitting: Internal Medicine

## 2021-03-21 NOTE — Telephone Encounter (Signed)
Patient calling back in and would like a referral to Minus Breeding a [facility] cardiologist in [place].

## 2021-03-21 NOTE — Telephone Encounter (Signed)
According to Patient the referral was declined.   "Pt not appropriate for AHF clinic, needs general cards, info faxed back to referring provider"  Patient wanting her new referral to be sent to [facility] cardiology in [place].

## 2021-03-22 NOTE — Telephone Encounter (Signed)
See attached notes.  Request referral be sent to Dr Percival Spanish - since Dr Benjamine Mola (is with heart failure clinic).  Per note, wanted sent to Dr Percival Spanish.  Let me know if I need to do anything more.  Thank you for your help.

## 2021-04-01 NOTE — Progress Notes (Signed)
Cardiology Office Note   Date:  04/02/2021   ID:  Ann Perkins, DOB 08/01/1945, MRN [id number]  PCP:  Ann Pheasant, MD  Cardiologist:   None Referring:  Ann Pheasant, MD  Chief Complaint  Patient presents with   Abnormal ECG      History of Present Illness: Ann Perkins is a 75 y.o. female who was referred by Ann Pheasant, MD for evaluation of HTN and an abnormal EKG.  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  ICD EKG from her primary provider where there was a PVC.  I do not have an old EKG for comparison.    The patient has had no past cardiac history.  She did have an episode of some unsteadiness decreased balance with some mild lightheadedness that she thought was related to Norvasc a few weeks ago.  She actually held that for about 4 days.  Her blood pressure was kind of low.  However, she was treated with Flonase and all of this cleared up.  She has been back on her Norvasc.  She otherwise feels quite well.  She denies any palpitations, presyncope or syncope.  She does not have any chest pressure, neck or arm discomfort.  She pushes her own lawnmower and lives independently.  She has never been married.  She has a niece who helps her if necessary.  She has never had any prior cardiac work-up or evaluation.  She does say that years ago she had a walk-in clinic visit for some palpitations but this was a rapid heartbeat.  Nothing ever came of that.  Past Medical History:  Diagnosis Date   Allergy    GERD (gastroesophageal reflux disease)    Hemorrhoids    History of colon polyps    Hypertension    Thyroid disease     Past Surgical History:  Procedure Laterality Date   COLONOSCOPY  2016   MOUTH SURGERY     x 2   TONSILLECTOMY     WRIST SURGERY       Current Outpatient Medications  Medication Sig Dispense Refill   amLODipine (NORVASC) 2.5 MG tablet TAKE ONE TABLET BY MOUTH EVERY DAY 90 tablet 1   fluticasone  (FLONASE) 50 MCG/ACT nasal spray Place into both nostrils daily as needed.     lisinopril (ZESTRIL) 10 MG tablet TAKE ONE TABLET BY MOUTH TWICE DAILY, INTHE MORNING AND AT BEDTIME. 60 tablet 2   sodium chloride (OCEAN) 0.65 % nasal spray Place 1 spray into the nose as needed for congestion.     SYNTHROID 75 MCG tablet Take 1 tablet (75 mcg total) by mouth daily. 30 tablet 5   Current Facility-Administered Medications  Medication Dose Route Frequency Provider Last Rate Last Admin   0.9 %  sodium chloride infusion  500 mL Intravenous Once Pyrtle, Lajuan Lines, MD        Allergies:   Erythromycin    Social History:  The patient  reports that she has never smoked. She has never used smokeless tobacco. She reports that she does not drink alcohol and does not use drugs.   Family History:  The patient's family history includes Colon cancer in her brother, cousin, and sister; Diabetes in her mother; Heart disease in her father and mother; Hyperlipidemia in her mother; Stroke in her father.    ROS:  Please see the history of present illness.   Otherwise, review of systems are positive for none.  All other systems are reviewed and negative.    PHYSICAL EXAM: VS:  BP (!) 172/84   Pulse 92   Ht '4\' 9"'$  (1.448 m)   Wt 127 lb 6.4 oz (57.8 kg)   LMP 07/21/1991   SpO2 98%   BMI 27.57 kg/m  , BMI Body mass index is 27.57 kg/m. GENERAL:  Well appearing HEENT:  Pupils equal round and reactive, fundi not visualized, oral mucosa unremarkable NECK:  No jugular venous distention, waveform within normal limits, carotid upstroke brisk and symmetric, no bruits, no thyromegaly LYMPHATICS:  No cervical, inguinal adenopathy LUNGS:  Clear to auscultation bilaterally BACK:  No CVA tenderness CHEST:  Unremarkable HEART:  PMI not displaced or sustained,S1 and S2 within normal limits, no S3, no S4, no clicks, no rubs, no murmurs ABD:  Flat, positive bowel sounds normal in frequency in pitch, no bruits, no rebound, no  guarding, no midline pulsatile mass, no hepatomegaly, no splenomegaly EXT:  2 plus pulses throughout, no edema, no cyanosis no clubbing SKIN:  No rashes no nodules NEURO:  Cranial nerves II through XII grossly intact, motor grossly intact throughout PSYCH:  Cognitively intact, oriented to person place and time    EKG:  EKG is ordered today. The ekg ordered today demonstrates sinus rhythm, rate 92, right bundle branch block, left anterior fascicular block, no acute ST-T wave changes, no old EKGs for comparison.   Recent Labs: 11/19/2020: ALT 13 01/24/2021: BUN 14; Creatinine, Ser 1.14; Hemoglobin 14.1; Platelets 217.0; Potassium 4.0; Sodium 141; TSH 0.55    Lipid Panel    Component Value Date/Time   CHOL 182 11/19/2020 0933   TRIG 123.0 11/19/2020 0933   HDL 44.90 11/19/2020 0933   CHOLHDL 4 11/19/2020 0933   VLDL 24.6 11/19/2020 0933   LDLCALC 113 (H) 11/19/2020 0933      Wt Readings from Last 3 Encounters:  04/02/21 127 lb 6.4 oz (57.8 kg)  02/25/21 127 lb 3.2 oz (57.7 kg)  01/24/21 129 lb (58.5 kg)      Other studies Reviewed: Additional studies/ records that were reviewed today include: Primary care office note and labs. Review of the above records demonstrates:  Please see elsewhere in the note.     ASSESSMENT AND PLAN:  HTN: The patient's blood pressure is elevated today but she says it is 112/70 at home and she showed me a blood pressure diary.  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  She had 1 episode of dizziness thought to be related to probably some sinus issues.  I am going to have her wear a 2-week event monitor to make sure that we do not see any evidence of significant bradycardia arrhythmias.  Otherwise we had a long conversation about the physiology of this and the need to alert me if she has any future presyncope or syncope.  She  understands that in the future she might need a pacemaker if there is any indication.  Of note she did have normal electrolytes and TSH recently.  Current medicines are reviewed at length with the patient today.  The patient does not have concerns regarding medicines.  The following changes have been made:  no change  Labs/ tests ordered today include:   Orders Placed This Encounter  Procedures   LONG TERM MONITOR (3-14 DAYS)   EKG 12-Lead      Disposition:  FU with APP in 6 months   Signed, Minus Breeding, MD  04/02/2021 10:08 AM    [place]

## 2021-04-02 ENCOUNTER — Ambulatory Visit (INDEPENDENT_AMBULATORY_CARE_PROVIDER_SITE_OTHER): Payer: Medicare HMO

## 2021-04-02 ENCOUNTER — Other Ambulatory Visit: Payer: Self-pay

## 2021-04-02 ENCOUNTER — Ambulatory Visit: Payer: Medicare HMO | Admitting: Cardiology

## 2021-04-02 ENCOUNTER — Encounter: Payer: Self-pay | Admitting: Cardiology

## 2021-04-02 VITALS — BP 172/84 | HR 92 | Ht <= 58 in | Wt 127.4 lb

## 2021-04-02 DIAGNOSIS — I4510 Unspecified right bundle-branch block: Secondary | ICD-10-CM | POA: Insufficient documentation

## 2021-04-02 DIAGNOSIS — I10 Essential (primary) hypertension: Secondary | ICD-10-CM

## 2021-04-02 DIAGNOSIS — I444 Left anterior fascicular block: Secondary | ICD-10-CM | POA: Diagnosis not present

## 2021-04-02 DIAGNOSIS — R9431 Abnormal electrocardiogram [ECG] [EKG]: Secondary | ICD-10-CM | POA: Insufficient documentation

## 2021-04-02 NOTE — Patient Instructions (Signed)
Medication Instructions:  The current medical regimen is effective;  continue present plan and medications.  *If you need a refill on your cardiac medications before your next appointment, please call your pharmacy*   Testing/Procedures:  [place] Monitor Instructions  Your physician has requested you wear a ZIO patch monitor for 14 days.  This is a single patch monitor. [employer] supplies one patch monitor per enrollment. Additional stickers are not available. Please do not apply patch if you will be having a Nuclear Stress Test,  Echocardiogram, Cardiac CT, MRI, or Chest Xray during the period you would be wearing the  monitor. The patch cannot be worn during these tests. You cannot remove and re-apply the  ZIO XT patch monitor.  Your ZIO patch monitor will be mailed 3 day USPS to your address on file. It may take 3-5 days  to receive your monitor after you have been enrolled.  Once you have received your monitor, please review the enclosed instructions. Your monitor  has already been registered assigning a specific monitor serial # to you.  Billing and Patient Assistance Program Information  We have supplied [employer] with any of your insurance information on file for billing purposes. [employer] offers a sliding scale Patient Assistance Program for patients that do not have  insurance, or whose insurance does not completely cover the cost of the ZIO monitor.  You must apply for the Patient Assistance Program to qualify for this discounted rate.  To apply, please call [employer] at [phone], select option 4, select option 2, ask to apply for  Patient Assistance Program. Ann Perkins will ask your household income, and how many people  are in your household. They will quote your out-of-pocket cost based on that information.  [employer] will also be able to set up a 29-month interest-free payment plan if needed.  Applying the monitor   Shave hair from upper left chest.  Hold abrader  disc by orange tab. Rub abrader in 40 strokes over the upper left chest as  indicated in your monitor instructions.  Clean area with 4 enclosed alcohol pads. Let dry.  Apply patch as indicated in monitor instructions. Patch will be placed under collarbone on left  side of chest with arrow pointing upward.  Rub patch adhesive wings for 2 minutes. Remove white label marked "1". Remove the white  label marked "2". Rub patch adhesive wings for 2 additional minutes.  While looking in a mirror, press and release button in center of patch. A small green light will  flash 3-4 times. This will be your only indicator that the monitor has been turned on.  Do not shower for the first 24 hours. You may shower after the first 24 hours.  Press the button if you feel a symptom. You will hear a small click. Record Date, Time and  Symptom in the Patient Logbook.  When you are ready to remove the patch, follow instructions on the last 2 pages of Patient  Logbook. Stick patch monitor onto the last page of Patient Logbook.  Place Patient Logbook in the blue and white box. Use locking tab on box and tape box closed  securely. The blue and white box has prepaid postage on it. Please place it in the mailbox as  soon as possible. Your physician should have your test results approximately 7 days after the  monitor has been mailed [facility] [address]  Call [employer] [phone] you have questions regarding  your ZIO  XT patch monitor. Call them immediately if you see an orange light blinking on your  monitor.  If your monitor falls off in less than 4 days, contact our Monitor department at [phone].  If your monitor becomes loose or falls off after 4 days call [employer] at [phone] for  suggestions on securing your monitor    Follow-Up: At [facility], you and your health needs are our priority.  As part of our continuing mission to provide you with exceptional heart care, we  have created designated Provider Care Teams.  These Care Teams include your primary Cardiologist (physician) and Advanced Practice Providers (APPs -  Physician Assistants and Nurse Practitioners) who all work together to provide you with the care you need, when you need it.  We recommend signing up for the patient portal called "MyChart".  Sign up information is provided on this After Visit Summary.  MyChart is used to connect with patients for Virtual Visits (Telemedicine).  Patients are able to view lab/test results, encounter notes, upcoming appointments, etc.  Non-urgent messages can be sent to your provider as well.   To learn more about what you can do with MyChart, go to [email or web address].    Your next appointment:   6 month(s)  The format for your next appointment:   In Person  Provider:   You may see Minus Breeding, MD or one of the following Advanced Practice Providers on your designated Care Team:   Rosaria Ferries, PA-C Caron Presume, PA-C Jory Sims, DNP, ANP

## 2021-04-02 NOTE — Progress Notes (Unsigned)
Enrolled patient for a 14 day Zio XT  monitor to be mailed to patients home  °

## 2021-04-04 ENCOUNTER — Other Ambulatory Visit: Payer: Self-pay | Admitting: Internal Medicine

## 2021-04-06 DIAGNOSIS — I4510 Unspecified right bundle-branch block: Secondary | ICD-10-CM | POA: Diagnosis not present

## 2021-04-06 DIAGNOSIS — I444 Left anterior fascicular block: Secondary | ICD-10-CM

## 2021-04-24 DIAGNOSIS — I444 Left anterior fascicular block: Secondary | ICD-10-CM | POA: Diagnosis not present

## 2021-04-24 DIAGNOSIS — I4510 Unspecified right bundle-branch block: Secondary | ICD-10-CM | POA: Diagnosis not present

## 2021-04-30 ENCOUNTER — Other Ambulatory Visit: Payer: Self-pay

## 2021-04-30 ENCOUNTER — Ambulatory Visit (INDEPENDENT_AMBULATORY_CARE_PROVIDER_SITE_OTHER): Payer: Medicare HMO

## 2021-04-30 DIAGNOSIS — Z23 Encounter for immunization: Secondary | ICD-10-CM | POA: Diagnosis not present

## 2021-05-09 ENCOUNTER — Telehealth (HOSPITAL_BASED_OUTPATIENT_CLINIC_OR_DEPARTMENT_OTHER): Payer: Self-pay | Admitting: *Deleted

## 2021-05-09 NOTE — Telephone Encounter (Signed)
Dr Percival Spanish reviewed blood pressure readings patient brought which will be scanned into chart No changes, continue same dose of medications  Advised patient , verbalized understanding

## 2021-05-28 DIAGNOSIS — H524 Presbyopia: Secondary | ICD-10-CM | POA: Diagnosis not present

## 2021-05-28 DIAGNOSIS — H25013 Cortical age-related cataract, bilateral: Secondary | ICD-10-CM | POA: Diagnosis not present

## 2021-05-28 DIAGNOSIS — H52223 Regular astigmatism, bilateral: Secondary | ICD-10-CM | POA: Diagnosis not present

## 2021-05-28 DIAGNOSIS — H2513 Age-related nuclear cataract, bilateral: Secondary | ICD-10-CM | POA: Diagnosis not present

## 2021-05-28 DIAGNOSIS — H5213 Myopia, bilateral: Secondary | ICD-10-CM | POA: Diagnosis not present

## 2021-07-02 ENCOUNTER — Other Ambulatory Visit: Payer: Self-pay

## 2021-07-02 ENCOUNTER — Ambulatory Visit (INDEPENDENT_AMBULATORY_CARE_PROVIDER_SITE_OTHER): Payer: Medicare HMO | Admitting: Internal Medicine

## 2021-07-02 ENCOUNTER — Encounter: Payer: Self-pay | Admitting: Internal Medicine

## 2021-07-02 VITALS — BP 134/76 | HR 88 | Temp 97.9°F | Resp 16 | Ht <= 58 in | Wt 124.2 lb

## 2021-07-02 DIAGNOSIS — N1831 Chronic kidney disease, stage 3a: Secondary | ICD-10-CM | POA: Diagnosis not present

## 2021-07-02 DIAGNOSIS — R42 Dizziness and giddiness: Secondary | ICD-10-CM | POA: Diagnosis not present

## 2021-07-02 DIAGNOSIS — E039 Hypothyroidism, unspecified: Secondary | ICD-10-CM

## 2021-07-02 DIAGNOSIS — Z0000 Encounter for general adult medical examination without abnormal findings: Secondary | ICD-10-CM | POA: Diagnosis not present

## 2021-07-02 DIAGNOSIS — I10 Essential (primary) hypertension: Secondary | ICD-10-CM | POA: Diagnosis not present

## 2021-07-02 DIAGNOSIS — E7800 Pure hypercholesterolemia, unspecified: Secondary | ICD-10-CM | POA: Diagnosis not present

## 2021-07-02 LAB — HEPATIC FUNCTION PANEL
ALT: 13 U/L (ref 0–35)
AST: 18 U/L (ref 0–37)
Albumin: 4.2 g/dL (ref 3.5–5.2)
Alkaline Phosphatase: 57 U/L (ref 39–117)
Bilirubin, Direct: 0.1 mg/dL (ref 0.0–0.3)
Total Bilirubin: 0.7 mg/dL (ref 0.2–1.2)
Total Protein: 7.3 g/dL (ref 6.0–8.3)

## 2021-07-02 LAB — LIPID PANEL
Cholesterol: 183 mg/dL (ref 0–200)
HDL: 59.7 mg/dL (ref >39.00)
LDL Cholesterol: 109 mg/dL — ABNORMAL HIGH (ref 0–99)
NonHDL: 122.8
Total CHOL/HDL Ratio: 3
Triglycerides: 69 mg/dL (ref 0.0–149.0)
VLDL: 13.8 mg/dL (ref 0.0–40.0)

## 2021-07-02 LAB — BASIC METABOLIC PANEL
BUN: 20 mg/dL (ref 6–23)
CO2: 27 mEq/L (ref 19–32)
Calcium: 9.9 mg/dL (ref 8.4–10.5)
Chloride: 104 mEq/L (ref 96–112)
Creatinine, Ser: 1.17 mg/dL (ref 0.40–1.20)
GFR: 46.17 mL/min — ABNORMAL LOW (ref >60.00)
Glucose, Bld: 95 mg/dL (ref 70–99)
Potassium: 4.2 mEq/L (ref 3.5–5.1)
Sodium: 139 mEq/L (ref 135–145)

## 2021-07-02 LAB — TSH: TSH: 0.74 u[IU]/mL (ref 0.35–5.50)

## 2021-07-02 NOTE — Assessment & Plan Note (Signed)
Physical today 07/02/21.  Colonoscopy 2019 - Dr Hilarie Fredrickson. Declines mammogram.  Declines mammogram.

## 2021-07-02 NOTE — Progress Notes (Signed)
Patient ID: Ann Perkins, female   DOB: [birth year], 75 y.o.   MRN: [id number]   Subjective:    Patient ID: Ann Perkins, female    DOB: [birth year], 75 y.o.   MRN: [id number]  This visit occurred during the SARS-CoV-2 public health emergency.  Safety protocols were in place, including screening questions prior to the visit, additional usage of staff PPE, and extensive cleaning of exam room while observing appropriate contact time as indicated for disinfecting solutions.   Patient here for her physical exam.   Chief Complaint  Patient presents with   Annual Exam   .   HPI She reports she is doing well.  Feels good.  Stays active.  No chest pain or sob.  No further dizzy spells or light headedness.  No increased heart rate or palpitations.  No acid reflux.  No abdominal pain.  Bowels moving.  Had eyes checked last month.  Declines mammogram and bone density.    Past Medical History:  Diagnosis Date   Allergy    GERD (gastroesophageal reflux disease)    Hemorrhoids    History of colon polyps    Hypertension    Thyroid disease    Past Surgical History:  Procedure Laterality Date   COLONOSCOPY  2016   MOUTH SURGERY     x 2   TONSILLECTOMY     WRIST SURGERY     Family History  Problem Relation Age of Onset   Hyperlipidemia Mother    Heart disease Mother        Pacemaker, heart attack   Diabetes Mother    Stroke Father    Heart disease Father        Angina   Colon cancer Sister        x 2 Half(Father Side)   Colon cancer Brother        Half(Father Side)   Colon cancer Cousin        Mother Side    Esophageal cancer Neg Hx    Rectal cancer Neg Hx    Stomach cancer Neg Hx    Social History   Socioeconomic History   Marital status: Single    Spouse name: Not on file   Number of children: Not on file   Years of education: Not on file   Highest education level: Not on file  Occupational History   Occupation: Retired  Tobacco Use   Smoking status: Never   Smokeless  tobacco: Never  Vaping Use   Vaping Use: Never used  Substance and Sexual Activity   Alcohol use: No    Alcohol/week: 0.0 standard drinks   Drug use: No   Sexual activity: Not on file  Other Topics Concern   Not on file  Social History Narrative   Lives alone.  Never married.     Social Determinants of Health   Financial Resource Strain: Not on file  Food Insecurity: Not on file  Transportation Needs: Not on file  Physical Activity: Not on file  Stress: Not on file  Social Connections: Not on file     Review of Systems  Constitutional:  Negative for appetite change and unexpected weight change.  HENT:  Negative for congestion, sinus pressure and sore throat.   Eyes:  Negative for pain and visual disturbance.  Respiratory:  Negative for cough, chest tightness and shortness of breath.   Cardiovascular:  Negative for chest pain, palpitations and leg swelling.  Gastrointestinal:  Negative for abdominal pain, diarrhea, nausea and vomiting.  Genitourinary:  Negative for difficulty urinating and dysuria.  Musculoskeletal:  Negative for joint swelling and myalgias.  Skin:  Negative for color change and rash.  Neurological:  Negative for dizziness, light-headedness and headaches.  Hematological:  Negative for adenopathy. Does not bruise/bleed easily.  Psychiatric/Behavioral:  Negative for agitation and dysphoric mood.       Objective:     BP 134/76    Pulse 88    Temp 97.9 F (36.6 C)    Resp 16    Ht 4\' 9"  (1.448 m)    Wt 124 lb 3.2 oz (56.3 kg)    LMP 07/21/1991    SpO2 98%    BMI 26.88 kg/m  Wt Readings from Last 3 Encounters:  07/02/21 124 lb 3.2 oz (56.3 kg)  04/02/21 127 lb 6.4 oz (57.8 kg)  02/25/21 127 lb 3.2 oz (57.7 kg)    Physical Exam Vitals reviewed.  Constitutional:      General: She is not in acute distress.    Appearance: Normal appearance. She is well-developed.  HENT:     Head: Normocephalic and atraumatic.     Right Ear: External ear normal.      Left Ear: External ear normal.  Eyes:     General: No scleral icterus.       Right eye: No discharge.        Left eye: No discharge.     Conjunctiva/sclera: Conjunctivae normal.  Neck:     Thyroid: No thyromegaly.  Cardiovascular:     Rate and Rhythm: Normal rate and regular rhythm.  Pulmonary:     Effort: No tachypnea, accessory muscle usage or respiratory distress.     Breath sounds: Normal breath sounds. No decreased breath sounds or wheezing.  Chest:  Breasts:    Right: No inverted nipple, mass, nipple discharge or tenderness (no axillary adenopathy).     Left: No inverted nipple, mass, nipple discharge or tenderness (no axilarry adenopathy).  Abdominal:     General: Bowel sounds are normal.     Palpations: Abdomen is soft.     Tenderness: There is no abdominal tenderness.  Musculoskeletal:        General: No swelling or tenderness.     Cervical back: Neck supple.  Lymphadenopathy:     Cervical: No cervical adenopathy.  Skin:    Findings: No erythema or rash.  Neurological:     Mental Status: She is alert and oriented to person, place, and time.  Psychiatric:        Mood and Affect: Mood normal.        Behavior: Behavior normal.     Outpatient Encounter Medications as of 07/02/2021  Medication Sig   amLODipine (NORVASC) 2.5 MG tablet TAKE ONE TABLET BY MOUTH EVERY DAY   fluticasone (FLONASE) 50 MCG/ACT nasal spray Place into both nostrils daily as needed.   lisinopril (ZESTRIL) 10 MG tablet TAKE ONE TABLET BY MOUTH TWICE DAILY, INTHE MORNING AND AT BEDTIME.   sodium chloride (OCEAN) 0.65 % nasal spray Place 1 spray into the nose as needed for congestion.   SYNTHROID 75 MCG tablet Take 1 tablet (75 mcg total) by mouth daily.   Facility-Administered Encounter Medications as of 07/02/2021  Medication   0.9 %  sodium chloride infusion     Lab Results  Component Value Date   WBC 6.6 01/24/2021   HGB 14.1 01/24/2021   HCT 41.9 01/24/2021   PLT 217.0 01/24/2021    GLUCOSE 95 07/02/2021   CHOL  183 07/02/2021   TRIG 69.0 07/02/2021   HDL 59.70 07/02/2021   LDLCALC 109 (H) 07/02/2021   ALT 13 07/02/2021   AST 18 07/02/2021   NA 139 07/02/2021   K 4.2 07/02/2021   CL 104 07/02/2021   CREATININE 1.17 07/02/2021   BUN 20 07/02/2021   CO2 27 07/02/2021   TSH 0.74 07/02/2021       Assessment & Plan:   Problem List Items Addressed This Visit     CKD (chronic kidney disease) stage 3, GFR 30-59 ml/min (HCC)    Avoid antiinflammatories.  Stay hydrated.  Follow metabolic panel.        Essential hypertension, benign - Primary    Reviewed outside blood pressure readings.  (most ranging:  110-130s/60-80.  Continue lisinopril and amlodipine.  Follow pressures.  Follow metabolic panel.       Relevant Orders   Basic metabolic panel (Completed)   Health care maintenance    Physical today 07/02/21.  Colonoscopy 2019 - Dr Hilarie Fredrickson. Declines mammogram.  Declines mammogram.       Hypercholesterolemia    Have discussed calculated cholesterol risk.  Have discussed recommendation to start a cholesterol medication.  She declines.  Low cholesterol diet and exercise.  Follow lipid panel and liver function tests.        Relevant Orders   Hepatic function panel (Completed)   Lipid panel (Completed)   Hypothyroidism    On thyroid replacement.  Follow tsh.       Relevant Orders   TSH (Completed)   Lightheadedness    Evaluated by cardiology.  Monitor (per cardiology) - short runs of SVT.  No other significant arrhythmia.  Doing well.  No further episodes.  Follow.         Einar Pheasant, MD

## 2021-07-03 ENCOUNTER — Encounter: Payer: Self-pay | Admitting: Internal Medicine

## 2021-07-03 NOTE — Assessment & Plan Note (Signed)
Have discussed calculated cholesterol risk.  Have discussed recommendation to start a cholesterol medication.  She declines.  Low cholesterol diet and exercise.  Follow lipid panel and liver function tests.   

## 2021-07-03 NOTE — Assessment & Plan Note (Signed)
On thyroid replacement.  Follow tsh.  

## 2021-07-03 NOTE — Assessment & Plan Note (Signed)
Reviewed outside blood pressure readings.  (most ranging:  110-130s/60-80.  Continue lisinopril and amlodipine.  Follow pressures.  Follow metabolic panel.

## 2021-07-03 NOTE — Assessment & Plan Note (Signed)
Evaluated by cardiology.  Monitor (per cardiology) - short runs of SVT.  No other significant arrhythmia.  Doing well.  No further episodes.  Follow.

## 2021-07-03 NOTE — Assessment & Plan Note (Signed)
Avoid antiinflammatories.  Stay hydrated.  Follow metabolic panel.   

## 2021-08-01 ENCOUNTER — Other Ambulatory Visit: Payer: Self-pay | Admitting: Internal Medicine

## 2021-09-30 DIAGNOSIS — H15111 Episcleritis periodica fugax, right eye: Secondary | ICD-10-CM | POA: Diagnosis not present

## 2021-10-13 ENCOUNTER — Other Ambulatory Visit: Payer: Self-pay | Admitting: Internal Medicine

## 2021-10-29 ENCOUNTER — Encounter: Payer: Self-pay | Admitting: Internal Medicine

## 2021-10-29 ENCOUNTER — Ambulatory Visit (INDEPENDENT_AMBULATORY_CARE_PROVIDER_SITE_OTHER): Payer: Medicare HMO | Admitting: Internal Medicine

## 2021-10-29 VITALS — BP 128/80 | HR 85 | Temp 97.8°F | Resp 21 | Ht <= 58 in | Wt 129.0 lb

## 2021-10-29 DIAGNOSIS — Z860100 Personal history of colon polyps, unspecified: Secondary | ICD-10-CM

## 2021-10-29 DIAGNOSIS — E7800 Pure hypercholesterolemia, unspecified: Secondary | ICD-10-CM

## 2021-10-29 DIAGNOSIS — N1831 Chronic kidney disease, stage 3a: Secondary | ICD-10-CM

## 2021-10-29 DIAGNOSIS — E039 Hypothyroidism, unspecified: Secondary | ICD-10-CM | POA: Diagnosis not present

## 2021-10-29 DIAGNOSIS — Z800 Family history of malignant neoplasm of digestive organs: Secondary | ICD-10-CM | POA: Diagnosis not present

## 2021-10-29 DIAGNOSIS — I10 Essential (primary) hypertension: Secondary | ICD-10-CM | POA: Diagnosis not present

## 2021-10-29 DIAGNOSIS — I4510 Unspecified right bundle-branch block: Secondary | ICD-10-CM | POA: Diagnosis not present

## 2021-10-29 LAB — LIPID PANEL
Cholesterol: 179 mg/dL (ref 0–200)
HDL: 55.7 mg/dL (ref >39.00)
LDL Cholesterol: 111 mg/dL — ABNORMAL HIGH (ref 0–99)
NonHDL: 123.48
Total CHOL/HDL Ratio: 3
Triglycerides: 60 mg/dL (ref 0.0–149.0)
VLDL: 12 mg/dL (ref 0.0–40.0)

## 2021-10-29 LAB — HEPATIC FUNCTION PANEL
ALT: 14 U/L (ref 0–35)
AST: 20 U/L (ref 0–37)
Albumin: 4.2 g/dL (ref 3.5–5.2)
Alkaline Phosphatase: 59 U/L (ref 39–117)
Bilirubin, Direct: 0.1 mg/dL (ref 0.0–0.3)
Total Bilirubin: 0.8 mg/dL (ref 0.2–1.2)
Total Protein: 7.2 g/dL (ref 6.0–8.3)

## 2021-10-29 LAB — BASIC METABOLIC PANEL
BUN: 24 mg/dL — ABNORMAL HIGH (ref 6–23)
CO2: 26 mEq/L (ref 19–32)
Calcium: 9.5 mg/dL (ref 8.4–10.5)
Chloride: 104 mEq/L (ref 96–112)
Creatinine, Ser: 1.31 mg/dL — ABNORMAL HIGH (ref 0.40–1.20)
GFR: 40.22 mL/min — ABNORMAL LOW (ref >60.00)
Glucose, Bld: 100 mg/dL — ABNORMAL HIGH (ref 70–99)
Potassium: 4.9 mEq/L (ref 3.5–5.1)
Sodium: 138 mEq/L (ref 135–145)

## 2021-10-29 LAB — TSH: TSH: 0.44 u[IU]/mL (ref 0.35–5.50)

## 2021-10-29 NOTE — Progress Notes (Signed)
Patient ID: Ann Perkins, female   DOB: [birth year], 75 y.o.   MRN: [id number] ? ? ?Subjective:  ? ? Patient ID: Ann Perkins, female    DOB: [birth year], 76 y.o.   MRN: [id number] ? ?This visit occurred during the SARS-CoV-2 public health emergency.  Safety protocols were in place, including screening questions prior to the visit, additional usage of staff PPE, and extensive cleaning of exam room while observing appropriate contact time as indicated for disinfecting solutions.  ? ?Patient here for a scheduled follow up.  ? ?Chief Complaint  ?Patient presents with  ? Follow-up  ?  4 MO F/U -HTN   ? .  ? ?HPI ?States she is doing well.  Feels good.  Reviewed outside blood pressure checks.  Most readings averaging:  106-120s/60-80.  No chest pain or sob reported.  No abdominal pain or bowel change reported.  Declines mammogram.  Saw Dr Percival Spanish.  Wore monitor for two weeks - no sustained bracycardia arrhythmia.  There were some nonsustained asymptomatic runs of SVT.  No change in therapy recommended.  Had recommended 6 month f/u.  Desires to schedule.  ? ? ?Past Medical History:  ?Diagnosis Date  ? Allergy   ? GERD (gastroesophageal reflux disease)   ? Hemorrhoids   ? History of colon polyps   ? Hypertension   ? Thyroid disease   ? ?Past Surgical History:  ?Procedure Laterality Date  ? COLONOSCOPY  2016  ? MOUTH SURGERY    ? x 2  ? TONSILLECTOMY    ? WRIST SURGERY    ? ?Family History  ?Problem Relation Age of Onset  ? Hyperlipidemia Mother   ? Heart disease Mother   ?     Pacemaker, heart attack  ? Diabetes Mother   ? Stroke Father   ? Heart disease Father   ?     Angina  ? Colon cancer Sister   ?     x 2 Half(Father Side)  ? Colon cancer Brother   ?     Half(Father Side)  ? Colon cancer Cousin   ?     Mother Side   ? Esophageal cancer Neg Hx   ? Rectal cancer Neg Hx   ? Stomach cancer Neg Hx   ? ?Social History  ? ?Socioeconomic History  ? Marital status: Single  ?  Spouse name: Not on file  ? Number of children: Not on  file  ? Years of education: Not on file  ? Highest education level: Not on file  ?Occupational History  ? Occupation: Retired  ?Tobacco Use  ? Smoking status: Never  ? Smokeless tobacco: Never  ?Vaping Use  ? Vaping Use: Never used  ?Substance and Sexual Activity  ? Alcohol use: No  ?  Alcohol/week: 0.0 standard drinks  ? Drug use: No  ? Sexual activity: Not on file  ?Other Topics Concern  ? Not on file  ?Social History Narrative  ? Lives alone.  Never married.    ? ?Social Determinants of Health  ? ?Financial Resource Strain: Not on file  ?Food Insecurity: Not on file  ?Transportation Needs: Not on file  ?Physical Activity: Not on file  ?Stress: Not on file  ?Social Connections: Not on file  ? ? ? ?Review of Systems  ?Constitutional:  Negative for appetite change and unexpected weight change.  ?HENT:  Negative for congestion and sinus pressure.   ?Respiratory:  Negative for cough, chest tightness and shortness of breath.   ?Cardiovascular:  Negative for chest pain, palpitations and leg swelling.  ?Gastrointestinal:  Negative for abdominal pain, diarrhea, nausea and vomiting.  ?Genitourinary:  Negative for difficulty urinating and dysuria.  ?Musculoskeletal:  Negative for joint swelling and myalgias.  ?Skin:  Negative for color change and rash.  ?Neurological:  Negative for dizziness, light-headedness and headaches.  ?Psychiatric/Behavioral:  Negative for agitation and dysphoric mood.   ? ?   ?Objective:  ?  ? ?BP 128/80 (BP Location: Left Arm, Patient Position: Sitting, Cuff Size: Small)   Pulse 85   Temp 97.8 ?F (36.6 ?C) (Temporal)   Resp (!) 21   Ht '4\' 9"'$  (1.448 m)   Wt 129 lb (58.5 kg)   LMP 07/21/1991   SpO2 98%   BMI 27.92 kg/m?  ?Wt Readings from Last 3 Encounters:  ?10/29/21 129 lb (58.5 kg)  ?07/02/21 124 lb 3.2 oz (56.3 kg)  ?04/02/21 127 lb 6.4 oz (57.8 kg)  ? ? ?Physical Exam ?Vitals reviewed.  ?Constitutional:   ?   General: She is not in acute distress. ?   Appearance: Normal appearance.   ?HENT:  ?   Head: Normocephalic and atraumatic.  ?   Right Ear: External ear normal.  ?   Left Ear: External ear normal.  ?Eyes:  ?   General: No scleral icterus.    ?   Right eye: No discharge.     ?   Left eye: No discharge.  ?   Conjunctiva/sclera: Conjunctivae normal.  ?Neck:  ?   Thyroid: No thyromegaly.  ?Cardiovascular:  ?   Rate and Rhythm: Normal rate and regular rhythm.  ?Pulmonary:  ?   Effort: No respiratory distress.  ?   Breath sounds: Normal breath sounds. No wheezing.  ?Abdominal:  ?   General: Bowel sounds are normal.  ?   Palpations: Abdomen is soft.  ?   Tenderness: There is no abdominal tenderness.  ?Musculoskeletal:     ?   General: No swelling or tenderness.  ?   Cervical back: Neck supple. No tenderness.  ?Lymphadenopathy:  ?   Cervical: No cervical adenopathy.  ?Skin: ?   Findings: No erythema or rash.  ?Neurological:  ?   Mental Status: She is alert.  ?Psychiatric:     ?   Mood and Affect: Mood normal.     ?   Behavior: Behavior normal.  ? ? ? ?Outpatient Encounter Medications as of 10/29/2021  ?Medication Sig  ? amLODipine (NORVASC) 2.5 MG tablet TAKE ONE TABLET BY MOUTH EVERY DAY  ? fluticasone (FLONASE) 50 MCG/ACT nasal spray Place into both nostrils daily as needed.  ? lisinopril (ZESTRIL) 10 MG tablet TAKE ONE TABLET BY MOUTH TWICE DAILY, INTHE MORNING AND AT BEDTIME.  ? sodium chloride (OCEAN) 0.65 % nasal spray Place 1 spray into the nose as needed for congestion.  ? SYNTHROID 75 MCG tablet TAKE ONE TABLET BY MOUTH EVERY DAY  ? ?Facility-Administered Encounter Medications as of 10/29/2021  ?Medication  ? 0.9 %  sodium chloride infusion  ?  ? ?Lab Results  ?Component Value Date  ? WBC 6.6 01/24/2021  ? HGB 14.1 01/24/2021  ? HCT 41.9 01/24/2021  ? PLT 217.0 01/24/2021  ? GLUCOSE 100 (H) 10/29/2021  ? CHOL 179 10/29/2021  ? TRIG 60.0 10/29/2021  ? HDL 55.70 10/29/2021  ? LDLCALC 111 (H) 10/29/2021  ? ALT 14 10/29/2021  ? AST 20 10/29/2021  ? NA 138 10/29/2021  ? K 4.9 10/29/2021  ? CL  104 10/29/2021  ?  CREATININE 1.31 (H) 10/29/2021  ? BUN 24 (H) 10/29/2021  ? CO2 26 10/29/2021  ? TSH 0.44 10/29/2021  ? ? ?   ?Assessment & Plan:  ? ?Problem List Items Addressed This Visit   ? ? CKD (chronic kidney disease) stage 3, GFR 30-59 ml/min (HCC)  ?  Avoid antiinflammatories.  Stay hydrated.  Follow metabolic panel.   ?  ?  ? Essential hypertension, benign - Primary  ?  Reviewed outside blood pressure readings.  (most ranging:  106-120s/60-80).  Continue lisinopril and amlodipine.  Follow pressures.  Follow metabolic panel.  ?  ?  ? Relevant Orders  ? Basic Metabolic Panel (BMET) (Completed)  ? Family history of malignant neoplasm of gastrointestinal tract  ?  Colonoscopy 12/2017 - Dr Hilarie Fredrickson.  Continue f/u with GI. Recommended f/u in 5 years.  ?  ?  ? History of colonic polyps  ?  Colonoscopy 12/2017 - Dr Hilarie Fredrickson.  Continue f/u with GI.  ?  ?  ? Hypercholesterolemia  ?  Have discussed calculated cholesterol risk.  Have discussed recommendation to start a cholesterol medication.  She declines.  Low cholesterol diet and exercise.  Follow lipid panel and liver function tests.   ?  ?  ? Relevant Orders  ? Lipid Profile (Completed)  ? Hepatic function panel (Completed)  ? Hypothyroidism  ?  On thyroid replacement.  Follow tsh.  ?  ?  ? Relevant Orders  ? TSH (Completed)  ? RBBB  ?  Saw Dr Percival Spanish.  Wore monitor for two weeks - no sustained bracycardia arrhythmia.  There were some nonsustained asymptomatic runs of SVT.  No change in therapy recommended.  Had recommended 6 month f/u. Due f/u.  Need to arrange.  ?  ?  ? ? ? ?Einar Pheasant, MD  ?

## 2021-10-30 ENCOUNTER — Encounter: Payer: Self-pay | Admitting: Internal Medicine

## 2021-10-30 ENCOUNTER — Other Ambulatory Visit: Payer: Self-pay | Admitting: Internal Medicine

## 2021-10-30 DIAGNOSIS — R739 Hyperglycemia, unspecified: Secondary | ICD-10-CM

## 2021-10-30 DIAGNOSIS — N1831 Chronic kidney disease, stage 3a: Secondary | ICD-10-CM

## 2021-10-30 NOTE — Assessment & Plan Note (Signed)
Have discussed calculated cholesterol risk.  Have discussed recommendation to start a cholesterol medication.  She declines.  Low cholesterol diet and exercise.  Follow lipid panel and liver function tests.   

## 2021-10-30 NOTE — Assessment & Plan Note (Signed)
On thyroid replacement.  Follow tsh.  

## 2021-10-30 NOTE — Assessment & Plan Note (Signed)
Colonoscopy 12/2017 - Dr Pyrtle.  Continue f/u with GI. Recommended f/u in 5 years.  

## 2021-10-30 NOTE — Assessment & Plan Note (Signed)
Avoid antiinflammatories.  Stay hydrated.  Follow metabolic panel.   

## 2021-10-30 NOTE — Progress Notes (Signed)
Order placed for f/u labs.  

## 2021-10-30 NOTE — Assessment & Plan Note (Signed)
Saw Dr Percival Spanish.  Wore monitor for two weeks - no sustained bracycardia arrhythmia.  There were some nonsustained asymptomatic runs of SVT.  No change in therapy recommended.  Had recommended 6 month f/u. Due f/u.  Need to arrange.  ?

## 2021-10-30 NOTE — Assessment & Plan Note (Signed)
Colonoscopy 12/2017 - Dr Hilarie Fredrickson.  Continue f/u with GI.  ?

## 2021-10-30 NOTE — Assessment & Plan Note (Signed)
Reviewed outside blood pressure readings.  (most ranging:  106-120s/60-80).  Continue lisinopril and amlodipine.  Follow pressures.  Follow metabolic panel.  ?

## 2021-11-03 ENCOUNTER — Telehealth: Payer: Self-pay | Admitting: Internal Medicine

## 2021-11-03 ENCOUNTER — Other Ambulatory Visit: Payer: Self-pay

## 2021-11-03 DIAGNOSIS — W57XXXA Bitten or stung by nonvenomous insect and other nonvenomous arthropods, initial encounter: Secondary | ICD-10-CM

## 2021-11-03 NOTE — Telephone Encounter (Signed)
Pt called in requesting for lab orders... Pt is requesting lab order for tick disease... Pt do have future labs in system... Pt think she have been bitten by a tick... Pt requesting callback  ?

## 2021-11-04 NOTE — Telephone Encounter (Signed)
Please confirm if pt is having any acute symptoms.  (Specifically fever, rash, headache or increased joint aches).  Labs have been ordered.  Let us know if any problems.    ?

## 2021-11-04 NOTE — Addendum Note (Signed)
Addended by: Leeanne Rio on: 11/04/2021 09:29 AM ? ? Modules accepted: Orders ? ?

## 2021-11-04 NOTE — Telephone Encounter (Signed)
Pt confirmed yesterday she felt fine. No fever, lethargy, headache, or rash. ?Pt was advised to be vigilant for those sx and call if they appear ?

## 2021-11-19 ENCOUNTER — Other Ambulatory Visit (INDEPENDENT_AMBULATORY_CARE_PROVIDER_SITE_OTHER): Payer: Medicare HMO

## 2021-11-19 DIAGNOSIS — N1831 Chronic kidney disease, stage 3a: Secondary | ICD-10-CM | POA: Diagnosis not present

## 2021-11-19 LAB — BASIC METABOLIC PANEL
BUN: 17 mg/dL (ref 6–23)
CO2: 28 mEq/L (ref 19–32)
Calcium: 9.5 mg/dL (ref 8.4–10.5)
Chloride: 102 mEq/L (ref 96–112)
Creatinine, Ser: 1.19 mg/dL (ref 0.40–1.20)
GFR: 45.12 mL/min — ABNORMAL LOW (ref >60.00)
Glucose, Bld: 97 mg/dL (ref 70–99)
Potassium: 5.1 mEq/L (ref 3.5–5.1)
Sodium: 136 mEq/L (ref 135–145)

## 2021-11-26 NOTE — Progress Notes (Signed)
?Cardiology Office Note:   ? ?Date:  11/27/2021  ? ?IDQuintavia Perkins, DOB 11-13-1945, MRN [id number] ? ?PCP:  Einar Pheasant, MD ?[place] Cardiologist: Minus Breeding, MD  ? ?Reason for visit: 31-monthfollow-up ? ?History of Present Illness:   ? ?PKathye Ciprianiis a 76y.o. female with a hx of hypertension who was initially referred for abnormal EKG with right bundle branch block and left anterior fascicular block.  Patient denied palpitations, presyncope, chest pain.  She lives independently.  2 week Zio patch was ordered.  Patient was in normal rhythm with a average heart rate 94.  She had rare ectopy. ? ?Today, she states she does well.  No presyncope/syncope.  She rarely feels a 1 second flutter.  Otherwise, she denies chest pain, shortness of breath, PND, orthopnea, lower extremity edema.  We reviewed her blood pressure log.  Blood pressure averages 110s/70s, HR 80s.   ?  ?Past Medical History:  ?Diagnosis Date  ? Allergy   ? GERD (gastroesophageal reflux disease)   ? Hemorrhoids   ? History of colon polyps   ? Hypertension   ? Thyroid disease   ? ? ?Past Surgical History:  ?Procedure Laterality Date  ? COLONOSCOPY  2016  ? MOUTH SURGERY    ? x 2  ? TONSILLECTOMY    ? WRIST SURGERY    ? ? ?Current Medications: ?Current Meds  ?Medication Sig  ? amLODipine (NORVASC) 2.5 MG tablet TAKE ONE TABLET BY MOUTH EVERY DAY  ? fluticasone (FLONASE) 50 MCG/ACT nasal spray Place into both nostrils daily as needed.  ? lisinopril (ZESTRIL) 10 MG tablet TAKE ONE TABLET BY MOUTH TWICE DAILY, INTHE MORNING AND AT BEDTIME.  ? sodium chloride (OCEAN) 0.65 % nasal spray Place 1 spray into the nose as needed for congestion.  ? SYNTHROID 75 MCG tablet TAKE ONE TABLET BY MOUTH EVERY DAY  ? ?Current Facility-Administered Medications for the 11/27/21 encounter (Office Visit) with LWarren Lacy PA-C  ?Medication  ? 0.9 %  sodium chloride infusion  ?  ? ?Allergies:   Erythromycin  ? ?Social History  ? ?Socioeconomic  History  ? Marital status: Single  ?  Spouse name: Not on file  ? Number of children: Not on file  ? Years of education: Not on file  ? Highest education level: Not on file  ?Occupational History  ? Occupation: Retired  ?Tobacco Use  ? Smoking status: Never  ? Smokeless tobacco: Never  ?Vaping Use  ? Vaping Use: Never used  ?Substance and Sexual Activity  ? Alcohol use: No  ?  Alcohol/week: 0.0 standard drinks  ? Drug use: No  ? Sexual activity: Not on file  ?Other Topics Concern  ? Not on file  ?Social History Narrative  ? Lives alone.  Never married.    ? ?Social Determinants of Health  ? ?Financial Resource Strain: Not on file  ?Food Insecurity: Not on file  ?Transportation Needs: Not on file  ?Physical Activity: Not on file  ?Stress: Not on file  ?Social Connections: Not on file  ?  ? ?Family History: ?The patient's family history includes Colon cancer in her brother, cousin, and sister; Diabetes in her mother; Heart disease in her father and mother; Hyperlipidemia in her mother; Stroke in her father. There is no history of Esophageal cancer, Rectal cancer, or Stomach cancer. ? ?ROS:   ?Please see the history of present illness.    ? ?EKGs/Labs/Other Studies Reviewed:   ? ?EKG:  The ekg ordered today demonstrates normal sinus rhythm, left anterior fascicular block, right bundle branch block, heart rate 81, QRS duration 118 ms. ? ?Recent Labs: ?01/24/2021: Hemoglobin 14.1; Platelets 217.0 ?10/29/2021: ALT 14; TSH 0.44 ?11/19/2021: BUN 17; Creatinine, Ser 1.19; Potassium 5.1; Sodium 136  ? ?Recent Lipid Panel ?Lab Results  ?Component Value Date/Time  ? CHOL 179 10/29/2021 10:43 AM  ? TRIG 60.0 10/29/2021 10:43 AM  ? HDL 55.70 10/29/2021 10:43 AM  ? LDLCALC 111 (H) 10/29/2021 10:43 AM  ? ? ?Physical Exam:   ? ?VS:  BP 136/79   Pulse 81   Ht '4\' 9"'$  (1.448 m)   Wt 132 lb (59.9 kg)   LMP 07/21/1991   SpO2 98%   BMI 28.56 kg/m?    ?No data found. ? ?Wt Readings from Last 3 Encounters:  ?11/27/21 132 lb (59.9 kg)   ?10/29/21 129 lb (58.5 kg)  ?07/02/21 124 lb 3.2 oz (56.3 kg)  ?  ? ?GEN:  Well nourished, well developed in no acute distress ?HEENT: Normal ?NECK: No JVD; No carotid bruits ?CARDIAC: RRR, no murmurs, rubs, gallops ?RESPIRATORY:  Clear to auscultation without rales, wheezing or rhonchi  ?ABDOMEN: Soft, non-tender, non-distended ?MUSCULOSKELETAL: No edema; No deformity  ?SKIN: Warm and dry ?NEUROLOGIC:  Alert and oriented ?PSYCHIATRIC:  Normal affect  ?  ?ASSESSMENT AND PLAN  ? ?Abnormal EKG ?-Right bundle branch block and left anterior fascicular block  ?-Progression of chronic bifascicular block to complete heart block is infrequent, annual rate of 1% in asymptomatic patients ?-Zio patch x 14 days: NSR, rare ectopy ?-She is not on AV nodal blocking agents.   ?-No intervention needed at this time. ? ?HTN, well controlled ?-Continue amlodipine and lisinopril.  Kidney function and potassium were normal last week.  TSH in normal range (on Synthroid).   ? ?Disposition - Follow-up in 1 year with Dr. Percival Perkins. ? ? ?Medication Adjustments/Labs and Tests Ordered: ?Current medicines are reviewed at length with the patient today.  Concerns regarding medicines are outlined above.  ?Orders Placed This Encounter  ?Procedures  ? EKG 12-Lead  ? ?No orders of the defined types were placed in this encounter. ? ? ?Patient Instructions  ?Medication Instructions:  ?Your physician recommends that you continue on your current medications as directed. Please refer to the Current Medication list given to you today.  ? ?*If you need a refill on your cardiac medications before your next appointment, please call your pharmacy* ? ? ?Lab Work: ?NONE ordered at this time of appointment   ? ?If you have labs (blood work) drawn today and your tests are completely normal, you will receive your results only by: ?MyChart Message (if you have MyChart) OR ?A paper copy in the mail ?If you have any lab test that is abnormal or we need to change your  treatment, we will call you to review the results. ? ? ?Testing/Procedures: ?NONE ordered at this time of appointment  ? ? ? ?Follow-Up: ?At [facility], you and your health needs are our priority.  As part of our continuing mission to provide you with exceptional heart care, we have created designated Provider Care Teams.  These Care Teams include your primary Cardiologist (physician) and Advanced Practice Providers (APPs -  Physician Assistants and Nurse Practitioners) who all work together to provide you with the care you need, when you need it. ? ?We recommend signing up for the patient portal called "MyChart".  Sign up information is provided on this After Visit Summary.  MyChart is used to connect with patients for Virtual Visits (Telemedicine).  Patients are able to view lab/test results, encounter notes, upcoming appointments, etc.  Non-urgent messages can be sent to your provider as well.   ?To learn more about what you can do with MyChart, go to [email or web address].   ? ?Your next appointment:   ?1 year(s) ? ?The format for your next appointment:   ?In Person ? ?Provider:   ?Minus Breeding, MD   ? ? ?Other Instructions ? ?Important Information About Sugar ? ? ? ? ? ?  ? ?Signed, ?Warren Lacy, PA-C  ?11/27/2021 10:49 AM    ?[place] ?

## 2021-11-27 ENCOUNTER — Ambulatory Visit: Payer: Medicare HMO | Admitting: Physician Assistant

## 2021-11-27 ENCOUNTER — Encounter: Payer: Self-pay | Admitting: Physician Assistant

## 2021-11-27 VITALS — BP 136/79 | HR 81 | Ht <= 58 in | Wt 132.0 lb

## 2021-11-27 DIAGNOSIS — I444 Left anterior fascicular block: Secondary | ICD-10-CM

## 2021-11-27 DIAGNOSIS — I10 Essential (primary) hypertension: Secondary | ICD-10-CM | POA: Diagnosis not present

## 2021-11-27 DIAGNOSIS — I4510 Unspecified right bundle-branch block: Secondary | ICD-10-CM

## 2021-11-27 NOTE — Patient Instructions (Signed)
Medication Instructions:  ?Your physician recommends that you continue on your current medications as directed. Please refer to the Current Medication list given to you today.  ? ?*If you need a refill on your cardiac medications before your next appointment, please call your pharmacy* ? ? ?Lab Work: ?NONE ordered at this time of appointment   ? ?If you have labs (blood work) drawn today and your tests are completely normal, you will receive your results only by: ?MyChart Message (if you have MyChart) OR ?A paper copy in the mail ?If you have any lab test that is abnormal or we need to change your treatment, we will call you to review the results. ? ? ?Testing/Procedures: ?NONE ordered at this time of appointment  ? ? ? ?Follow-Up: ?At [facility] you and your health needs are our priority.  As part of our continuing mission to provide you with exceptional heart care, we have created designated Provider Care Teams.  These Care Teams include your primary Cardiologist (physician) and Advanced Practice Providers (APPs -  Physician Assistants and Nurse Practitioners) who all work together to provide you with the care you need, when you need it. ? ?We recommend signing up for the patient portal called "MyChart".  Sign up information is provided on this After Visit Summary.  MyChart is used to connect with patients for Virtual Visits (Telemedicine).  Patients are able to view lab/test results, encounter notes, upcoming appointments, etc.  Non-urgent messages can be sent to your provider as well.   ?To learn more about what you can do with MyChart, go to [email or web address].   ? ?Your next appointment:   ?1 year(s) ? ?The format for your next appointment:   ?In Person ? ?Provider:   ?Minus Breeding, MD   ? ? ?Other Instructions ? ?Important Information About Sugar ? ? ? ? ? ? ?

## 2021-12-09 ENCOUNTER — Other Ambulatory Visit: Payer: Self-pay | Admitting: Internal Medicine

## 2021-12-10 ENCOUNTER — Other Ambulatory Visit (INDEPENDENT_AMBULATORY_CARE_PROVIDER_SITE_OTHER): Payer: Medicare HMO

## 2021-12-10 DIAGNOSIS — W57XXXA Bitten or stung by nonvenomous insect and other nonvenomous arthropods, initial encounter: Secondary | ICD-10-CM

## 2021-12-10 DIAGNOSIS — N1831 Chronic kidney disease, stage 3a: Secondary | ICD-10-CM

## 2021-12-10 DIAGNOSIS — R739 Hyperglycemia, unspecified: Secondary | ICD-10-CM

## 2021-12-10 LAB — BASIC METABOLIC PANEL
BUN: 17 mg/dL (ref 6–23)
CO2: 25 mEq/L (ref 19–32)
Calcium: 9.3 mg/dL (ref 8.4–10.5)
Chloride: 105 mEq/L (ref 96–112)
Creatinine, Ser: 1.14 mg/dL (ref 0.40–1.20)
GFR: 47.49 mL/min — ABNORMAL LOW (ref >60.00)
Glucose, Bld: 101 mg/dL — ABNORMAL HIGH (ref 70–99)
Potassium: 3.8 mEq/L (ref 3.5–5.1)
Sodium: 139 mEq/L (ref 135–145)

## 2021-12-10 NOTE — Addendum Note (Signed)
Addended by: Leeanne Rio on: 12/10/2021 09:50 AM   Modules accepted: Orders

## 2021-12-10 NOTE — Addendum Note (Signed)
Addended by: Leeanne Rio on: 12/10/2021 09:39 AM   Modules accepted: Orders

## 2021-12-16 ENCOUNTER — Telehealth: Payer: Self-pay | Admitting: Internal Medicine

## 2021-12-16 NOTE — Telephone Encounter (Signed)
Per our conversation, pt advised to restart synthroid tomorrow.

## 2021-12-16 NOTE — Telephone Encounter (Signed)
Patient accidentally took 2 SYNTHROID 75 MCG tablet pills yesterday. She called her pharmacy, they told to call Poison Control. Poison Control advised patient to call her provider today to see how she needs to re-start her   SYNTHROID 75 MCG tablet.

## 2021-12-30 ENCOUNTER — Other Ambulatory Visit: Payer: Self-pay | Admitting: Internal Medicine

## 2022-01-13 ENCOUNTER — Other Ambulatory Visit: Payer: Self-pay | Admitting: Internal Medicine

## 2022-03-02 ENCOUNTER — Telehealth: Payer: Self-pay | Admitting: Internal Medicine

## 2022-03-02 NOTE — Telephone Encounter (Signed)
Pt would like to be called regarding her appointment that was canceled on 8/16

## 2022-03-03 NOTE — Telephone Encounter (Signed)
Pt has been scheduled per vanessa

## 2022-03-04 ENCOUNTER — Ambulatory Visit: Payer: Medicare HMO | Admitting: Internal Medicine

## 2022-03-11 ENCOUNTER — Other Ambulatory Visit: Payer: Self-pay | Admitting: Internal Medicine

## 2022-03-11 ENCOUNTER — Ambulatory Visit: Payer: Medicare HMO | Admitting: Internal Medicine

## 2022-03-12 ENCOUNTER — Ambulatory Visit (INDEPENDENT_AMBULATORY_CARE_PROVIDER_SITE_OTHER): Payer: Medicare HMO | Admitting: Internal Medicine

## 2022-03-12 ENCOUNTER — Encounter: Payer: Self-pay | Admitting: Internal Medicine

## 2022-03-12 VITALS — BP 134/72 | HR 96 | Temp 98.1°F | Resp 18 | Ht <= 58 in | Wt 130.4 lb

## 2022-03-12 DIAGNOSIS — E039 Hypothyroidism, unspecified: Secondary | ICD-10-CM | POA: Diagnosis not present

## 2022-03-12 DIAGNOSIS — I10 Essential (primary) hypertension: Secondary | ICD-10-CM | POA: Diagnosis not present

## 2022-03-12 DIAGNOSIS — Z860100 Personal history of colon polyps, unspecified: Secondary | ICD-10-CM

## 2022-03-12 DIAGNOSIS — K625 Hemorrhage of anus and rectum: Secondary | ICD-10-CM | POA: Diagnosis not present

## 2022-03-12 DIAGNOSIS — L989 Disorder of the skin and subcutaneous tissue, unspecified: Secondary | ICD-10-CM | POA: Diagnosis not present

## 2022-03-12 DIAGNOSIS — N1831 Chronic kidney disease, stage 3a: Secondary | ICD-10-CM | POA: Diagnosis not present

## 2022-03-12 DIAGNOSIS — E7800 Pure hypercholesterolemia, unspecified: Secondary | ICD-10-CM | POA: Diagnosis not present

## 2022-03-12 LAB — BASIC METABOLIC PANEL
BUN: 15 mg/dL (ref 6–23)
CO2: 28 mEq/L (ref 19–32)
Calcium: 9.6 mg/dL (ref 8.4–10.5)
Chloride: 104 mEq/L (ref 96–112)
Creatinine, Ser: 1.13 mg/dL (ref 0.40–1.20)
GFR: 47.91 mL/min — ABNORMAL LOW (ref >60.00)
Glucose, Bld: 111 mg/dL — ABNORMAL HIGH (ref 70–99)
Potassium: 4.4 mEq/L (ref 3.5–5.1)
Sodium: 140 mEq/L (ref 135–145)

## 2022-03-12 LAB — CBC WITH DIFFERENTIAL/PLATELET
Basophils Absolute: 0 10*3/uL (ref 0.0–0.1)
Basophils Relative: 0.6 % (ref 0.0–3.0)
Eosinophils Absolute: 0.1 10*3/uL (ref 0.0–0.7)
Eosinophils Relative: 1.7 % (ref 0.0–5.0)
HCT: 41.2 % (ref 36.0–46.0)
Hemoglobin: 13.8 g/dL (ref 12.0–15.0)
Lymphocytes Relative: 24.7 % (ref 12.0–46.0)
Lymphs Abs: 1.3 10*3/uL (ref 0.7–4.0)
MCHC: 33.3 g/dL (ref 30.0–36.0)
MCV: 89.6 fl (ref 78.0–100.0)
Monocytes Absolute: 0.5 10*3/uL (ref 0.1–1.0)
Monocytes Relative: 9.5 % (ref 3.0–12.0)
Neutro Abs: 3.3 10*3/uL (ref 1.4–7.7)
Neutrophils Relative %: 63.5 % (ref 43.0–77.0)
Platelets: 190 10*3/uL (ref 150.0–400.0)
RBC: 4.6 Mil/uL (ref 3.87–5.11)
RDW: 13.3 % (ref 11.5–15.5)
WBC: 5.1 10*3/uL (ref 4.0–10.5)

## 2022-03-12 LAB — LIPID PANEL
Cholesterol: 183 mg/dL (ref 0–200)
HDL: 43.1 mg/dL (ref >39.00)
LDL Cholesterol: 115 mg/dL — ABNORMAL HIGH (ref 0–99)
NonHDL: 139.42
Total CHOL/HDL Ratio: 4
Triglycerides: 120 mg/dL (ref 0.0–149.0)
VLDL: 24 mg/dL (ref 0.0–40.0)

## 2022-03-12 LAB — HEPATIC FUNCTION PANEL
ALT: 13 U/L (ref 0–35)
AST: 19 U/L (ref 0–37)
Albumin: 4.3 g/dL (ref 3.5–5.2)
Alkaline Phosphatase: 67 U/L (ref 39–117)
Bilirubin, Direct: 0.1 mg/dL (ref 0.0–0.3)
Total Bilirubin: 0.7 mg/dL (ref 0.2–1.2)
Total Protein: 7.4 g/dL (ref 6.0–8.3)

## 2022-03-12 LAB — TSH: TSH: 0.32 u[IU]/mL — ABNORMAL LOW (ref 0.35–5.50)

## 2022-03-12 MED ORDER — HYDROCORTISONE ACETATE 25 MG RE SUPP: 25.0000 mg | Freq: Two times a day (BID) | RECTAL | 0 refills | Status: DC

## 2022-03-12 NOTE — Progress Notes (Signed)
Patient ID: Ann Perkins, female   DOB: 31-Jan-1946, 76 y.o.   MRN: [id number]   Subjective:    Patient ID: Ann Perkins, female    DOB: Mar 13, 1946, 76 y.o.   MRN: [id number]   Patient here for a scheduled follow up.   Chief Complaint  Patient presents with   Hypertension   Hypothyroidism   .   HPI Has been monitoring blood pressures at home.  Most readings averaging 105-130/70s.  Saw cardiology for f/u 11/27/21.  Previous zio patch - SR - average heart rate 94 with rare ectopy.  Felt stable. No further intervention.  Breathing stable.  No chest pain.  Eating.  No nausea or vomiting.  Does report previously noticing some blood with wiping.  Request refill HC suppositories.  Denies any increased constipation.  Reports persistent back lesion.  Request referral to dermatology.    Past Medical History:  Diagnosis Date   Allergy    GERD (gastroesophageal reflux disease)    Hemorrhoids    History of colon polyps    Hypertension    Thyroid disease    Past Surgical History:  Procedure Laterality Date   COLONOSCOPY  2016   MOUTH SURGERY     x 2   TONSILLECTOMY     WRIST SURGERY     Family History  Problem Relation Age of Onset   Hyperlipidemia Mother    Heart disease Mother        Pacemaker, heart attack   Diabetes Mother    Stroke Father    Heart disease Father        Angina   Colon cancer Sister        x 2 Half(Father Side)   Colon cancer Brother        Half(Father Side)   Colon cancer Cousin        Mother Side    Esophageal cancer Neg Hx    Rectal cancer Neg Hx    Stomach cancer Neg Hx    Social History   Socioeconomic History   Marital status: Single    Spouse name: Not on file   Number of children: Not on file   Years of education: Not on file   Highest education level: Not on file  Occupational History   Occupation: Retired  Tobacco Use   Smoking status: Never   Smokeless tobacco: Never  Vaping Use   Vaping Use: Never used  Substance and Sexual Activity    Alcohol use: No    Alcohol/week: 0.0 standard drinks of alcohol   Drug use: No   Sexual activity: Not on file  Other Topics Concern   Not on file  Social History Narrative   Lives alone.  Never married.     Social Determinants of Health   Financial Resource Strain: Not on file  Food Insecurity: Not on file  Transportation Needs: Not on file  Physical Activity: Not on file  Stress: Not on file  Social Connections: Not on file     Review of Systems  Constitutional:  Negative for appetite change and unexpected weight change.  HENT:  Negative for congestion and sinus pressure.   Respiratory:  Negative for cough, chest tightness and shortness of breath.   Cardiovascular:  Negative for chest pain, palpitations and leg swelling.  Gastrointestinal:  Negative for abdominal pain, diarrhea, nausea and vomiting.       Blood with wiping as outlined.   Genitourinary:  Negative for difficulty urinating and dysuria.  Musculoskeletal:  Negative  for joint swelling and myalgias.  Skin:  Negative for color change and rash.  Neurological:  Negative for dizziness, light-headedness and headaches.  Psychiatric/Behavioral:  Negative for agitation and dysphoric mood.        Objective:     BP 134/72   Pulse 96   Temp 98.1 F (36.7 C) (Temporal)   Resp 18   Ht '4\' 9"'$  (1.448 m)   Wt 130 lb 6.4 oz (59.1 kg)   LMP 07/21/1991   SpO2 96%   BMI 28.22 kg/m  Wt Readings from Last 3 Encounters:  03/12/22 130 lb 6.4 oz (59.1 kg)  11/27/21 132 lb (59.9 kg)  10/29/21 129 lb (58.5 kg)    Physical Exam Vitals reviewed.  Constitutional:      General: She is not in acute distress.    Appearance: Normal appearance.  HENT:     Head: Normocephalic and atraumatic.     Right Ear: External ear normal.     Left Ear: External ear normal.  Eyes:     General: No scleral icterus.       Right eye: No discharge.        Left eye: No discharge.     Conjunctiva/sclera: Conjunctivae normal.  Neck:      Thyroid: No thyromegaly.  Cardiovascular:     Rate and Rhythm: Normal rate and regular rhythm.  Pulmonary:     Effort: No respiratory distress.     Breath sounds: Normal breath sounds. No wheezing.  Abdominal:     General: Bowel sounds are normal.     Palpations: Abdomen is soft.     Tenderness: There is no abdominal tenderness.  Genitourinary:    Comments: Rectal - heme negative.  Musculoskeletal:        General: No swelling or tenderness.     Cervical back: Neck supple. No tenderness.  Lymphadenopathy:     Cervical: No cervical adenopathy.  Skin:    Findings: No erythema or rash.  Neurological:     Mental Status: She is alert.  Psychiatric:        Mood and Affect: Mood normal.        Behavior: Behavior normal.      Outpatient Encounter Medications as of 03/12/2022  Medication Sig   amLODipine (NORVASC) 2.5 MG tablet TAKE 1 TABLET BY MOUTH DAILY   fluticasone (FLONASE) 50 MCG/ACT nasal spray Place into both nostrils daily as needed.   hydrocortisone (ANUSOL-HC) 25 MG suppository Place 1 suppository (25 mg total) rectally 2 (two) times daily.   lisinopril (ZESTRIL) 10 MG tablet TAKE ONE TABLET BY MOUTH TWICE DAILY, INTHE MORNING AND AT BEDTIME.   sodium chloride (OCEAN) 0.65 % nasal spray Place 1 spray into the nose as needed for congestion.   [DISCONTINUED] SYNTHROID 75 MCG tablet TAKE ONE TABLET BY MOUTH EVERY DAY   Facility-Administered Encounter Medications as of 03/12/2022  Medication   0.9 %  sodium chloride infusion     Lab Results  Component Value Date   WBC 5.1 03/12/2022   HGB 13.8 03/12/2022   HCT 41.2 03/12/2022   PLT 190.0 03/12/2022   GLUCOSE 111 (H) 03/12/2022   CHOL 183 03/12/2022   TRIG 120.0 03/12/2022   HDL 43.10 03/12/2022   LDLCALC 115 (H) 03/12/2022   ALT 13 03/12/2022   AST 19 03/12/2022   NA 140 03/12/2022   K 4.4 03/12/2022   CL 104 03/12/2022   CREATININE 1.13 03/12/2022   BUN 15 03/12/2022   CO2 28  03/12/2022   TSH 0.32 (L)  03/12/2022    CT Head Wo Contrast  Result Date: 09/17/2019 CLINICAL DATA:  Poly trauma head and C-spine injury. EXAM: CT HEAD WITHOUT CONTRAST CT CERVICAL SPINE WITHOUT CONTRAST TECHNIQUE: Multidetector CT imaging of the head and cervical spine was performed following the standard protocol without intravenous contrast. Multiplanar CT image reconstructions of the cervical spine were also generated. COMPARISON:  None FINDINGS: CT HEAD FINDINGS Brain: No evidence of acute infarction, hemorrhage, hydrocephalus, extra-axial collection or mass lesion/mass effect. Signs of chronic microvascular ischemic change and atrophy. Vascular: No hyperdense vessel or unexpected calcification. Skull: Normal. Negative for fracture or focal lesion. Sinuses/Orbits: No acute finding. Other: Signs of right frontal hematoma. Small hematoma without underlying fracture. CT CERVICAL SPINE FINDINGS Alignment: Normal. Skull base and vertebrae: No acute fracture. No primary bone lesion or focal pathologic process. Soft tissues and spinal canal: No prevertebral fluid or swelling. No visible canal hematoma. Disc levels: Multilevel degenerative changes in the spine greatest at C5-6 and C6-7 with mild to moderate anterior osteophytes and uncovertebral degenerative changes. Upper chest: Negative. Other: None IMPRESSION: 1. No CT evidence for acute intracranial process. 2. Signs of chronic microvascular ischemic change and atrophy. 3. Signs of right frontal hematoma without underlying fracture. 4. No fracture or malalignment in the cervical spine. Electronically Signed   By: Zetta Bills M.D.   On: 09/17/2019 14:36   CT Cervical Spine Wo Contrast  Result Date: 09/17/2019 CLINICAL DATA:  Poly trauma head and C-spine injury. EXAM: CT HEAD WITHOUT CONTRAST CT CERVICAL SPINE WITHOUT CONTRAST TECHNIQUE: Multidetector CT imaging of the head and cervical spine was performed following the standard protocol without intravenous contrast. Multiplanar  CT image reconstructions of the cervical spine were also generated. COMPARISON:  None FINDINGS: CT HEAD FINDINGS Brain: No evidence of acute infarction, hemorrhage, hydrocephalus, extra-axial collection or mass lesion/mass effect. Signs of chronic microvascular ischemic change and atrophy. Vascular: No hyperdense vessel or unexpected calcification. Skull: Normal. Negative for fracture or focal lesion. Sinuses/Orbits: No acute finding. Other: Signs of right frontal hematoma. Small hematoma without underlying fracture. CT CERVICAL SPINE FINDINGS Alignment: Normal. Skull base and vertebrae: No acute fracture. No primary bone lesion or focal pathologic process. Soft tissues and spinal canal: No prevertebral fluid or swelling. No visible canal hematoma. Disc levels: Multilevel degenerative changes in the spine greatest at C5-6 and C6-7 with mild to moderate anterior osteophytes and uncovertebral degenerative changes. Upper chest: Negative. Other: None IMPRESSION: 1. No CT evidence for acute intracranial process. 2. Signs of chronic microvascular ischemic change and atrophy. 3. Signs of right frontal hematoma without underlying fracture. 4. No fracture or malalignment in the cervical spine. Electronically Signed   By: Zetta Bills M.D.   On: 09/17/2019 14:36   DG Wrist Complete Right  Result Date: 09/17/2019 CLINICAL DATA:  Status post fall, right wrist pain EXAM: RIGHT WRIST - COMPLETE 3+ VIEW COMPARISON:  None. FINDINGS: Generalized osteopenia. Comminuted and impacted distal radial metaphysis fracture extending to the articular surface of the radiocarpal joint. Nondisplaced fracture of the ulnar styloid process. Mild osteoarthritis of the first [facility] joint, first MCP joint and first IP joint. Soft tissue swelling around the right wrist. IMPRESSION: 1. Comminuted and impacted distal radial metaphysis fracture extending to the articular surface of the radiocarpal joint. 2. Nondisplaced fracture of the ulnar styloid  process. Electronically Signed   By: Kathreen Devoid   On: 09/17/2019 14:28   DG Ribs Unilateral W/Chest Right  Result Date: 09/17/2019  CLINICAL DATA:  Right rib pain. EXAM: RIGHT RIBS AND CHEST - 3+ VIEW COMPARISON:  None. FINDINGS: No fracture or other bone lesions are seen involving the ribs. There is no evidence of pneumothorax or pleural effusion. Both lungs are clear. Heart size and mediastinal contours are within normal limits. IMPRESSION: Negative. Electronically Signed   By: Kathreen Devoid   On: 09/17/2019 14:26       Assessment & Plan:   Problem List Items Addressed This Visit     Blood per rectum    Notice of blood with wiping.  Heme negative on exam.  HC suppositories refilled.  Refer back to GI as outlined.       Relevant Orders   Ambulatory referral to Gastroenterology   CKD (chronic kidney disease) stage 3, GFR 30-59 ml/min (HCC)    Avoid antiinflammatories.  Stay hydrated.  Follow metabolic panel.  Continue lisinopril.       Essential hypertension, benign - Primary    Outside blood pressures as outlined.  Continue lisinopril and amlodipine.  Follow pressures.  Follow metabolic panel.       Relevant Orders   Basic Metabolic Panel (BMET) (Completed)   History of colonic polyps    Colonoscopy 12/2017 - Dr Hilarie Fredrickson.  Continue f/u with GI. Refer back as outlined - notice of blood with wiping.        Hypercholesterolemia    Have discussed calculated cholesterol risk.  Have discussed recommendation to start a cholesterol medication.  She declines.  Low cholesterol diet and exercise.  Follow lipid panel and liver function tests.        Relevant Orders   Lipid Profile (Completed)   Hepatic function panel (Completed)   CBC w/Diff (Completed)   Hypothyroidism    On thyroid replacement.  Follow tsh.       Relevant Orders   TSH (Completed)   Skin lesion of back    Persistent skin lesion of back.  Request referral to dermatology.        Relevant Orders   Ambulatory  referral to Dermatology     Einar Pheasant, MD

## 2022-03-13 ENCOUNTER — Other Ambulatory Visit: Payer: Self-pay

## 2022-03-13 DIAGNOSIS — E039 Hypothyroidism, unspecified: Secondary | ICD-10-CM

## 2022-03-13 DIAGNOSIS — E7800 Pure hypercholesterolemia, unspecified: Secondary | ICD-10-CM

## 2022-03-13 MED ORDER — LEVOTHYROXINE SODIUM 50 MCG PO TABS: 50.0000 ug | ORAL_TABLET | Freq: Every day | ORAL | 3 refills | Status: DC

## 2022-03-14 ENCOUNTER — Other Ambulatory Visit: Payer: Self-pay | Admitting: Internal Medicine

## 2022-03-14 DIAGNOSIS — E039 Hypothyroidism, unspecified: Secondary | ICD-10-CM

## 2022-03-14 NOTE — Progress Notes (Signed)
Order placed for f/u tsh.  

## 2022-03-15 ENCOUNTER — Encounter: Payer: Self-pay | Admitting: Internal Medicine

## 2022-03-15 DIAGNOSIS — K625 Hemorrhage of anus and rectum: Secondary | ICD-10-CM | POA: Insufficient documentation

## 2022-03-15 DIAGNOSIS — L989 Disorder of the skin and subcutaneous tissue, unspecified: Secondary | ICD-10-CM | POA: Insufficient documentation

## 2022-03-15 NOTE — Assessment & Plan Note (Addendum)
Colonoscopy 12/2017 - Dr Hilarie Fredrickson.  Continue f/u with GI. Refer back as outlined - notice of blood with wiping.

## 2022-03-15 NOTE — Assessment & Plan Note (Signed)
Persistent skin lesion of back.  Request referral to dermatology.

## 2022-03-15 NOTE — Assessment & Plan Note (Signed)
On thyroid replacement.  Follow tsh.  

## 2022-03-15 NOTE — Assessment & Plan Note (Signed)
Notice of blood with wiping.  Heme negative on exam.  HC suppositories refilled.  Refer back to GI as outlined.

## 2022-03-15 NOTE — Assessment & Plan Note (Signed)
Have discussed calculated cholesterol risk.  Have discussed recommendation to start a cholesterol medication.  She declines.  Low cholesterol diet and exercise.  Follow lipid panel and liver function tests.   

## 2022-03-15 NOTE — Assessment & Plan Note (Signed)
Avoid antiinflammatories.  Stay hydrated.  Follow metabolic panel.  Continue lisinopril.  

## 2022-03-15 NOTE — Assessment & Plan Note (Signed)
Outside blood pressures as outlined.  Continue lisinopril and amlodipine.  Follow pressures.  Follow metabolic panel.  

## 2022-03-25 ENCOUNTER — Telehealth: Payer: Self-pay | Admitting: Internal Medicine

## 2022-03-25 NOTE — Telephone Encounter (Signed)
Patient following up on her referral appointments with Dermatology and Gastroenterology.

## 2022-03-27 ENCOUNTER — Encounter: Payer: Self-pay | Admitting: Gastroenterology

## 2022-03-31 ENCOUNTER — Other Ambulatory Visit: Payer: Self-pay | Admitting: Internal Medicine

## 2022-04-22 ENCOUNTER — Other Ambulatory Visit (INDEPENDENT_AMBULATORY_CARE_PROVIDER_SITE_OTHER): Payer: Medicare HMO

## 2022-04-22 DIAGNOSIS — Z23 Encounter for immunization: Secondary | ICD-10-CM

## 2022-04-22 DIAGNOSIS — E039 Hypothyroidism, unspecified: Secondary | ICD-10-CM

## 2022-04-22 LAB — TSH: TSH: 9.67 u[IU]/mL — ABNORMAL HIGH (ref 0.35–5.50)

## 2022-04-23 ENCOUNTER — Telehealth: Payer: Self-pay

## 2022-04-23 ENCOUNTER — Other Ambulatory Visit: Payer: Self-pay

## 2022-04-23 ENCOUNTER — Other Ambulatory Visit: Payer: Self-pay | Admitting: Internal Medicine

## 2022-04-23 DIAGNOSIS — E7800 Pure hypercholesterolemia, unspecified: Secondary | ICD-10-CM

## 2022-04-23 DIAGNOSIS — E039 Hypothyroidism, unspecified: Secondary | ICD-10-CM

## 2022-04-23 DIAGNOSIS — N1831 Chronic kidney disease, stage 3a: Secondary | ICD-10-CM

## 2022-04-23 MED ORDER — LEVOTHYROXINE SODIUM 75 MCG PO TABS: 75.0000 ug | ORAL_TABLET | Freq: Every day | ORAL | 0 refills | Status: DC

## 2022-04-23 NOTE — Telephone Encounter (Signed)
LMTCB for lab results.  

## 2022-04-23 NOTE — Progress Notes (Signed)
Orders placed for future labs.  

## 2022-04-30 ENCOUNTER — Ambulatory Visit: Payer: Medicare HMO | Admitting: Gastroenterology

## 2022-04-30 ENCOUNTER — Encounter: Payer: Self-pay | Admitting: Gastroenterology

## 2022-04-30 VITALS — BP 160/80 | HR 96 | Ht <= 58 in | Wt 131.0 lb

## 2022-04-30 DIAGNOSIS — Z800 Family history of malignant neoplasm of digestive organs: Secondary | ICD-10-CM | POA: Diagnosis not present

## 2022-04-30 DIAGNOSIS — K625 Hemorrhage of anus and rectum: Secondary | ICD-10-CM | POA: Diagnosis not present

## 2022-04-30 NOTE — Patient Instructions (Signed)
You have been scheduled for a colonoscopy. Please follow written instructions given to you at your visit today.  Please pick up your prep supplies at the pharmacy within the next 1-3 days. If you use inhalers (even only as needed), please bring them with you on the day of your procedure.  _______________________________________________________  If you are age 74 or older, your body mass index should be between 23-30. Your Body mass index is 28.35 kg/m. If this is out of the aforementioned range listed, please consider follow up with your Primary Care Provider.  If you are age 29 or younger, your body mass index should be between 19-25. Your Body mass index is 28.35 kg/m. If this is out of the aformentioned range listed, please consider follow up with your Primary Care Provider.   ________________________________________________________  The [facility] providers would like to encourage you to use [facility] to communicate with providers for non-urgent requests or questions.  Due to long hold times on the telephone, sending your provider a message by [place] may be a faster and more efficient way to get a response.  Please allow 48 business hours for a response.  Please remember that this is for non-urgent requests.  _______________________________________________________

## 2022-04-30 NOTE — Progress Notes (Signed)
04/30/2022 [name] [id number] 04-18-1946   HISTORY OF PRESENT ILLNESS: This is a pleasant 76 year old female who is a patient of Dr. Vena Rua.  She has strong family history of colon cancer and has personal history of colon polyps.  Last colonoscopy June 2019 as below.  She presents here today for complaints of rectal bleeding.  She says that on August 1 she had a isolated episode of red blood on the toilet paper.  Has had not had any recurrence.  She saw her PCP and they examined her.  She was heme-negative.  Hemoglobins have been normal.  She was given hydrocortisone to use for hemorrhoids, but says she does not really feel like she needs to use it.  She was sent here as she is due for colonoscopy in June 2024 and they thought maybe she could have a colonoscopy a little bit sooner.  She reports that her bowel habits are not regular.  Often times she has incomplete emptying, back and forth to the bathroom multiple times.  Colonoscopy 12/2017:  - One 8 mm polyp in the ascending colon, removed with a cold snare. Resected and retrieved.  Sessile serrated polyp. - Moderate diverticulosis in the sigmoid colon and in the descending colon. - Small internal hemorrhoids.   Past Medical History:  Diagnosis Date   Allergy    GERD (gastroesophageal reflux disease)    Hemorrhoids    History of colon polyps    Hypertension    Thyroid disease    Past Surgical History:  Procedure Laterality Date   COLONOSCOPY  2016   MOUTH SURGERY     x 2   TONSILLECTOMY     WRIST SURGERY      reports that she has never smoked. She has never used smokeless tobacco. She reports that she does not drink alcohol and does not use drugs. family history includes Colon cancer in her brother, cousin, and sister; Diabetes in her mother; Heart disease in her father and mother; Hyperlipidemia in her mother; Stroke in her father. Allergies  Allergen Reactions   Erythromycin Diarrhea    Upset stomach       Outpatient Encounter Medications as of 04/30/2022  Medication Sig   amLODipine (NORVASC) 2.5 MG tablet TAKE 1 TABLET BY MOUTH DAILY   fluticasone (FLONASE) 50 MCG/ACT nasal spray Place into both nostrils daily as needed.   levothyroxine (SYNTHROID) 75 MCG tablet Take 1 tablet (75 mcg total) by mouth daily.   lisinopril (ZESTRIL) 10 MG tablet TAKE ONE TABLET BY MOUTH TWICE DAILY, INTHE MORNING AND AT BEDTIME.   sodium chloride (OCEAN) 0.65 % nasal spray Place 1 spray into the nose as needed for congestion.   [DISCONTINUED] hydrocortisone (ANUSOL-HC) 25 MG suppository Place 1 suppository (25 mg total) rectally 2 (two) times daily. (Patient not taking: Reported on 04/30/2022)   Facility-Administered Encounter Medications as of 04/30/2022  Medication   0.9 %  sodium chloride infusion     REVIEW OF SYSTEMS  : All other systems reviewed and negative except where noted in the History of Present Illness.   PHYSICAL EXAM: BP (!) 160/80   Pulse 96   Ht '4\' 9"'$  (1.448 m)   Wt 131 lb (59.4 kg)   LMP 07/21/1991   SpO2 97%   BMI 28.35 kg/m  General: Well developed white female in no acute distress Head: Normocephalic and atraumatic Eyes:  Sclerae anicteric, conjunctiva pink. Ears: Normal auditory acuity Lungs: Clear throughout to auscultation; no W/R/R. Heart: Regular rate  and rhythm; no Abdomen: Soft, nontender, non distended. No masses or hepatomegaly noted. Normal bowel sounds Rectal:  Will be done at the time of colonoscopy. Musculoskeletal: Symmetrical with no gross deformities  Skin: No lesions on visible extremities Extremities: No edema  Neurological: Alert oriented x 4, grossly non-focal Psychological:  Alert and cooperative. Normal mood and affect  ASSESSMENT AND PLAN: *76 year old female with strong family history of colon cancer and personal history of colon polyps.  Last colonoscopy in June 2019.  Had 1 episode of blood on the toilet paper on August 1st.  Was heme negative at  PCPs office, hemoglobin normal.  Suspect the bleeding was likely due to hemorrhoids.  Due to strong family history of colon cancer and a personal history of polyps she would like to proceed with colonoscopy.  We will schedule Dr. Hilarie Fredrickson.  The risks, benefits, and alternatives to colonoscopy were discussed with the patient and she consents to proceed.  I also suggested that she begin taking Benefiber 2 teaspoons mixed in 8 ounces of liquid daily to help bulk and regulate her bowel habits.   CC:  Einar Pheasant, MD

## 2022-05-07 NOTE — Progress Notes (Signed)
Addendum: Reviewed and agree with assessment and management plan. Nihira Puello M, MD  

## 2022-05-22 ENCOUNTER — Encounter: Payer: Self-pay | Admitting: Internal Medicine

## 2022-05-23 ENCOUNTER — Encounter: Payer: Self-pay | Admitting: Certified Registered Nurse Anesthetist

## 2022-05-28 ENCOUNTER — Ambulatory Visit (AMBULATORY_SURGERY_CENTER): Payer: Medicare HMO | Admitting: Internal Medicine

## 2022-05-28 ENCOUNTER — Encounter: Payer: Self-pay | Admitting: Internal Medicine

## 2022-05-28 VITALS — BP 135/86 | HR 76 | Temp 98.4°F | Resp 18 | Ht <= 58 in | Wt 131.0 lb

## 2022-05-28 DIAGNOSIS — Z800 Family history of malignant neoplasm of digestive organs: Secondary | ICD-10-CM | POA: Diagnosis not present

## 2022-05-28 DIAGNOSIS — Z86010 Personal history of colonic polyps: Secondary | ICD-10-CM

## 2022-05-28 DIAGNOSIS — D122 Benign neoplasm of ascending colon: Secondary | ICD-10-CM

## 2022-05-28 DIAGNOSIS — K649 Unspecified hemorrhoids: Secondary | ICD-10-CM

## 2022-05-28 DIAGNOSIS — K635 Polyp of colon: Secondary | ICD-10-CM | POA: Diagnosis not present

## 2022-05-28 DIAGNOSIS — K625 Hemorrhage of anus and rectum: Secondary | ICD-10-CM

## 2022-05-28 DIAGNOSIS — K5730 Diverticulosis of large intestine without perforation or abscess without bleeding: Secondary | ICD-10-CM | POA: Diagnosis not present

## 2022-05-28 DIAGNOSIS — I10 Essential (primary) hypertension: Secondary | ICD-10-CM | POA: Diagnosis not present

## 2022-05-28 MED ORDER — SODIUM CHLORIDE 0.9 % IV SOLN: 500.0000 mL | Freq: Once | INTRAVENOUS | Status: DC

## 2022-05-28 NOTE — Patient Instructions (Signed)
YOU HAD AN ENDOSCOPIC PROCEDURE TODAY AT THE [facility]:   Refer to the procedure report that was given to you for any specific questions about what was found during the examination.  If the procedure report does not answer your questions, please call your gastroenterologist to clarify.  If you requested that your care partner not be given the details of your procedure findings, then the procedure report has been included in a sealed envelope for you to review at your convenience later.  **Handouts given on polyps, hemorrhoids and diverticulosis**  YOU SHOULD EXPECT: Some feelings of bloating in the abdomen. Passage of more gas than usual.  Walking can help get rid of the air that was put into your GI tract during the procedure and reduce the bloating. If you had a lower endoscopy (such as a colonoscopy or flexible sigmoidoscopy) you may notice spotting of blood in your stool or on the toilet paper. If you underwent a bowel prep for your procedure, you may not have a normal bowel movement for a few days.  Please Note:  You might notice some irritation and congestion in your nose or some drainage.  This is from the oxygen used during your procedure.  There is no need for concern and it should clear up in a day or so.  SYMPTOMS TO REPORT IMMEDIATELY:  Following lower endoscopy (colonoscopy or flexible sigmoidoscopy):  Excessive amounts of blood in the stool  Significant tenderness or worsening of abdominal pains  Swelling of the abdomen that is new, acute  Fever of 100F or higher  For urgent or emergent issues, a gastroenterologist can be reached at any hour by calling [phone]. Do not use MyChart messaging for urgent concerns.    DIET:  We do recommend a small meal at first, but then you may proceed to your regular diet.  Drink plenty of fluids but you should avoid alcoholic beverages for 24 hours.  ACTIVITY:  You should plan to take it easy for the rest of today and you  should NOT DRIVE or use heavy machinery until tomorrow (because of the sedation medicines used during the test).    FOLLOW UP: Our staff will call the number listed on your records the next business day following your procedure.  We will call around 7:15- 8:00 am to check on you and address any questions or concerns that you may have regarding the information given to you following your procedure. If we do not reach you, we will leave a message.     If any biopsies were taken you will be contacted by phone or by letter within the next 1-3 weeks.  Please call us at [phone] if you have not heard about the biopsies in 3 weeks.    SIGNATURES/CONFIDENTIALITY: You and/or your care partner have signed paperwork which will be entered into your electronic medical record.  These signatures attest to the fact that that the information above on your After Visit Summary has been reviewed and is understood.  Full responsibility of the confidentiality of this discharge information lies with you and/or your care-partner. 

## 2022-05-28 NOTE — Op Note (Signed)
[facility] Patient Name: Ann Perkins Procedure Date: 05/28/2022 10:20 AM MRN: [id number] Endoscopist: Jerene Bears , MD, [id number] Age: 76 Referring MD:  Date of Birth: [birth year] Gender: Female Account #: [id number] Procedure:                Colonoscopy Indications:              Rectal bleeding, history of adenomatous polyps in                            the colon, Family history of colon cancer in                            multiple first-degree relatives, last colonoscopy                            June 2019 Medicines:                Monitored Anesthesia Care Procedure:                Pre-Anesthesia Assessment:                           - Prior to the procedure, a History and Physical                            was performed, and patient medications and                            allergies were reviewed. The patient's tolerance of                            previous anesthesia was also reviewed. The risks                            and benefits of the procedure and the sedation                            options and risks were discussed with the patient.                            All questions were answered, and informed consent                            was obtained. Prior Anticoagulants: The patient has                            taken no anticoagulant or antiplatelet agents. ASA                            Grade Assessment: II - A patient with mild systemic                            disease. After reviewing the risks and benefits,  the patient was deemed in satisfactory condition to                            undergo the procedure.                           After obtaining informed consent, the colonoscope                            was passed under direct vision. Throughout the                            procedure, the patient's blood pressure, pulse, and                            oxygen saturations were monitored continuously. The                             [id number] PCF-[id number] Slim SB Colonoscope was introduced                            through the anus and advanced to the cecum,                            identified by appendiceal orifice and ileocecal                            valve. The colonoscopy was performed without                            difficulty. The patient tolerated the procedure                            well. The quality of the bowel preparation was                            good. The ileocecal valve, appendiceal orifice, and                            rectum were photographed. Scope In: 10:23:16 AM Scope Out: 10:37:13 AM Scope Withdrawal Time: 0 hours 11 minutes 27 seconds  Total Procedure Duration: 0 hours 13 minutes 57 seconds  Findings:                 Two sessile polyps were found in the ascending                            colon. The polyps were 2 to 3 mm in size. These                            polyps were removed with a cold biopsy forceps.                            Resection and retrieval were complete.  Two sessile polyps were found in the ascending                            colon. The polyps were 4 to 5 mm in size. These                            polyps were removed with a cold snare. Resection                            and retrieval were complete.                           Multiple small-mouthed diverticula were found in                            the sigmoid colon and descending colon.                           Internal hemorrhoids were found during                            retroflexion. The hemorrhoids were small. Complications:            No immediate complications. Estimated Blood Loss:     Estimated blood loss was minimal. Impression:               - Two 2 to 3 mm polyps in the ascending colon,                            removed with a cold biopsy forceps. Resected and                            retrieved.                           - Two 4 to 5 mm  polyps in the ascending colon,                            removed with a cold snare. Resected and retrieved.                           - Moderate diverticulosis in the sigmoid colon and                            in the descending colon.                           - Internal hemorrhoids. Recommendation:           - Patient has a contact number available for                            emergencies. The signs and symptoms of potential  delayed complications were discussed with the                            patient. Return to normal activities tomorrow.                            Written discharge instructions were provided to the                            patient.                           - Resume previous diet.                           - Continue present medications.                           - Await pathology results.                           - Repeat colonoscopy is recommended for                            surveillance. The colonoscopy date will be                            determined after pathology results from today's                            exam become available for review. Jerene Bears, MD 05/28/2022 10:41:24 AM This report has been signed electronically.

## 2022-05-28 NOTE — Progress Notes (Signed)
Office note dated 04/30/2022 for details and current  H&P  Patient presenting with her colonoscopy to evaluate episode of rectal bleeding in the setting of her personal history of colon polyps and family history of colon cancer  She is appropriate for colonoscopy here today

## 2022-05-28 NOTE — Progress Notes (Signed)
Pt's states no medical or surgical changes since previsit or office visit. 

## 2022-05-28 NOTE — Progress Notes (Signed)
Called to room to assist during endoscopic procedure.  Patient ID and intended procedure confirmed with present staff. Received instructions for my participation in the procedure from the performing physician.  

## 2022-05-29 ENCOUNTER — Telehealth: Payer: Self-pay

## 2022-05-29 NOTE — Telephone Encounter (Signed)
  Follow up Call-     05/28/2022    9:16 AM  Call back number  Post procedure Call Back phone  # [phone]  Permission to leave phone message Yes     Patient questions:  Do you have a fever, pain , or abdominal swelling? No. Pain Score  0 *  Have you tolerated food without any problems? Yes.    Have you been able to return to your normal activities? Yes.    Do you have any questions about your discharge instructions: Diet   No. Medications  No. Follow up visit  No.  Do you have questions or concerns about your Care? No.  Actions: * If pain score is 4 or above: No action needed, pain <4.

## 2022-06-02 ENCOUNTER — Encounter: Payer: Self-pay | Admitting: Internal Medicine

## 2022-06-25 DIAGNOSIS — L853 Xerosis cutis: Secondary | ICD-10-CM | POA: Diagnosis not present

## 2022-06-25 DIAGNOSIS — D2272 Melanocytic nevi of left lower limb, including hip: Secondary | ICD-10-CM | POA: Diagnosis not present

## 2022-06-25 DIAGNOSIS — D2261 Melanocytic nevi of right upper limb, including shoulder: Secondary | ICD-10-CM | POA: Diagnosis not present

## 2022-06-25 DIAGNOSIS — D2262 Melanocytic nevi of left upper limb, including shoulder: Secondary | ICD-10-CM | POA: Diagnosis not present

## 2022-06-25 DIAGNOSIS — L814 Other melanin hyperpigmentation: Secondary | ICD-10-CM | POA: Diagnosis not present

## 2022-06-25 DIAGNOSIS — D2271 Melanocytic nevi of right lower limb, including hip: Secondary | ICD-10-CM | POA: Diagnosis not present

## 2022-06-25 DIAGNOSIS — D225 Melanocytic nevi of trunk: Secondary | ICD-10-CM | POA: Diagnosis not present

## 2022-06-25 DIAGNOSIS — L578 Other skin changes due to chronic exposure to nonionizing radiation: Secondary | ICD-10-CM | POA: Diagnosis not present

## 2022-06-25 DIAGNOSIS — L821 Other seborrheic keratosis: Secondary | ICD-10-CM | POA: Diagnosis not present

## 2022-06-30 ENCOUNTER — Other Ambulatory Visit: Payer: Self-pay | Admitting: Internal Medicine

## 2022-07-01 ENCOUNTER — Other Ambulatory Visit (INDEPENDENT_AMBULATORY_CARE_PROVIDER_SITE_OTHER): Payer: Medicare HMO

## 2022-07-01 DIAGNOSIS — E7800 Pure hypercholesterolemia, unspecified: Secondary | ICD-10-CM

## 2022-07-01 DIAGNOSIS — N1831 Chronic kidney disease, stage 3a: Secondary | ICD-10-CM | POA: Diagnosis not present

## 2022-07-01 DIAGNOSIS — E039 Hypothyroidism, unspecified: Secondary | ICD-10-CM

## 2022-07-01 LAB — BASIC METABOLIC PANEL
BUN: 16 mg/dL (ref 6–23)
CO2: 30 mEq/L (ref 19–32)
Calcium: 9.7 mg/dL (ref 8.4–10.5)
Chloride: 103 mEq/L (ref 96–112)
Creatinine, Ser: 1.15 mg/dL (ref 0.40–1.20)
GFR: 46.81 mL/min — ABNORMAL LOW (ref >60.00)
Glucose, Bld: 99 mg/dL (ref 70–99)
Potassium: 4.4 mEq/L (ref 3.5–5.1)
Sodium: 140 mEq/L (ref 135–145)

## 2022-07-01 LAB — LIPID PANEL
Cholesterol: 183 mg/dL (ref 0–200)
HDL: 48.6 mg/dL (ref >39.00)
LDL Cholesterol: 118 mg/dL — ABNORMAL HIGH (ref 0–99)
NonHDL: 134.83
Total CHOL/HDL Ratio: 4
Triglycerides: 84 mg/dL (ref 0.0–149.0)
VLDL: 16.8 mg/dL (ref 0.0–40.0)

## 2022-07-01 LAB — HEPATIC FUNCTION PANEL
ALT: 12 U/L (ref 0–35)
AST: 19 U/L (ref 0–37)
Albumin: 4.3 g/dL (ref 3.5–5.2)
Alkaline Phosphatase: 59 U/L (ref 39–117)
Bilirubin, Direct: 0.1 mg/dL (ref 0.0–0.3)
Total Bilirubin: 0.6 mg/dL (ref 0.2–1.2)
Total Protein: 7.2 g/dL (ref 6.0–8.3)

## 2022-07-01 LAB — TSH: TSH: 0.89 u[IU]/mL (ref 0.35–5.50)

## 2022-07-08 ENCOUNTER — Ambulatory Visit (INDEPENDENT_AMBULATORY_CARE_PROVIDER_SITE_OTHER): Payer: Medicare HMO | Admitting: Internal Medicine

## 2022-07-08 ENCOUNTER — Encounter: Payer: Self-pay | Admitting: Internal Medicine

## 2022-07-08 VITALS — BP 124/70 | HR 98 | Temp 97.9°F | Resp 15 | Ht 60.0 in | Wt 126.0 lb

## 2022-07-08 DIAGNOSIS — Z800 Family history of malignant neoplasm of digestive organs: Secondary | ICD-10-CM | POA: Diagnosis not present

## 2022-07-08 DIAGNOSIS — N1831 Chronic kidney disease, stage 3a: Secondary | ICD-10-CM | POA: Diagnosis not present

## 2022-07-08 DIAGNOSIS — Z0000 Encounter for general adult medical examination without abnormal findings: Secondary | ICD-10-CM

## 2022-07-08 DIAGNOSIS — Z86010 Personal history of colonic polyps: Secondary | ICD-10-CM | POA: Diagnosis not present

## 2022-07-08 DIAGNOSIS — E7800 Pure hypercholesterolemia, unspecified: Secondary | ICD-10-CM

## 2022-07-08 DIAGNOSIS — E039 Hypothyroidism, unspecified: Secondary | ICD-10-CM | POA: Diagnosis not present

## 2022-07-08 DIAGNOSIS — I10 Essential (primary) hypertension: Secondary | ICD-10-CM

## 2022-07-08 MED ORDER — AMLODIPINE BESYLATE 2.5 MG PO TABS: 2.5000 mg | ORAL_TABLET | Freq: Every day | ORAL | 3 refills | Status: DC

## 2022-07-08 MED ORDER — LEVOTHYROXINE SODIUM 75 MCG PO TABS: 75.0000 ug | ORAL_TABLET | Freq: Every day | ORAL | 5 refills | Status: DC

## 2022-07-08 NOTE — Progress Notes (Signed)
Subjective:    Patient ID: Ann Perkins, female    DOB: April 22, 1946, 76 y.o.   MRN: [id number]  Patient here for  Chief Complaint  Patient presents with   Annual Exam    CPE    HPI Here for physical exam. Saw cardiology for f/u 11/27/21. Previous zio patch - SR - average heart rate 94 with rare ectopy. Felt stable. Saw GI 10/12 23 - episode of rectal bleeding.  Recommended colonoscopy.  Colonoscopy 05/28/22.  Pt reports four polyps.  Recommended f/u in 4 years.  Reports she is doing well.  Some increased stress with her sister's health issues.  Overall she feels she is handling things relatively well.  No chest pain or sob reported.  No abdominal pain or bowel change reported.  Reviewed outside blood pressures - 107-133/60-70s.     Past Medical History:  Diagnosis Date   Allergy    GERD (gastroesophageal reflux disease)    Hemorrhoids    History of colon polyps    Hypertension    Thyroid disease    Past Surgical History:  Procedure Laterality Date   COLONOSCOPY  2016   MOUTH SURGERY     x 2   TONSILLECTOMY     WRIST SURGERY     Family History  Problem Relation Age of Onset   Hyperlipidemia Mother    Heart disease Mother        Pacemaker, heart attack   Diabetes Mother    Stroke Father    Heart disease Father        Angina   Colon cancer Sister        x 2 Half(Father Side)   Colon cancer Brother        Half(Father Side)   Colon cancer Cousin        Mother Side    Esophageal cancer Neg Hx    Rectal cancer Neg Hx    Stomach cancer Neg Hx    Social History   Socioeconomic History   Marital status: Single    Spouse name: Not on file   Number of children: Not on file   Years of education: Not on file   Highest education level: Not on file  Occupational History   Occupation: Retired  Tobacco Use   Smoking status: Never   Smokeless tobacco: Never  Vaping Use   Vaping Use: Never used  Substance and Sexual Activity   Alcohol use: No    Alcohol/week: 0.0  standard drinks of alcohol   Drug use: No   Sexual activity: Not Currently  Other Topics Concern   Not on file  Social History Narrative   Lives alone.  Never married.     Social Determinants of Health   Financial Resource Strain: Not on file  Food Insecurity: Not on file  Transportation Needs: Not on file  Physical Activity: Not on file  Stress: Not on file  Social Connections: Not on file     Review of Systems  Constitutional:  Negative for appetite change and unexpected weight change.  HENT:  Negative for congestion, sinus pressure and sore throat.   Eyes:  Negative for pain and visual disturbance.  Respiratory:  Negative for cough, chest tightness and shortness of breath.   Cardiovascular:  Negative for chest pain, palpitations and leg swelling.  Gastrointestinal:  Negative for abdominal pain, diarrhea, nausea and vomiting.  Genitourinary:  Negative for difficulty urinating and dysuria.  Musculoskeletal:  Negative for joint swelling and myalgias.  Skin:  Negative for color change and rash.  Neurological:  Negative for dizziness and headaches.  Hematological:  Negative for adenopathy. Does not bruise/bleed easily.  Psychiatric/Behavioral:  Negative for agitation and dysphoric mood.        Objective:     BP 124/70   Pulse 98   Temp 97.9 F (36.6 C) (Temporal)   Resp 15   Ht 5' (1.524 m)   Wt 126 lb (57.2 kg)   LMP 07/21/1991   SpO2 99%   BMI 24.61 kg/m  Wt Readings from Last 3 Encounters:  07/08/22 126 lb (57.2 kg)  05/28/22 131 lb (59.4 kg)  04/30/22 131 lb (59.4 kg)    Physical Exam Vitals reviewed.  Constitutional:      General: She is not in acute distress.    Appearance: Normal appearance. She is well-developed.  HENT:     Head: Normocephalic and atraumatic.     Right Ear: External ear normal.     Left Ear: External ear normal.  Eyes:     General: No scleral icterus.       Right eye: No discharge.        Left eye: No discharge.      Conjunctiva/sclera: Conjunctivae normal.  Neck:     Thyroid: No thyromegaly.  Cardiovascular:     Rate and Rhythm: Normal rate and regular rhythm.  Pulmonary:     Effort: No tachypnea, accessory muscle usage or respiratory distress.     Breath sounds: Normal breath sounds. No decreased breath sounds or wheezing.  Chest:  Breasts:    Right: No inverted nipple, mass, nipple discharge or tenderness (no axillary adenopathy).     Left: No inverted nipple, mass, nipple discharge or tenderness (no axilarry adenopathy).  Abdominal:     General: Bowel sounds are normal.     Palpations: Abdomen is soft.     Tenderness: There is no abdominal tenderness.  Musculoskeletal:        General: No swelling or tenderness.     Cervical back: Neck supple.  Lymphadenopathy:     Cervical: No cervical adenopathy.  Skin:    Findings: No erythema or rash.  Neurological:     Mental Status: She is alert and oriented to person, place, and time.  Psychiatric:        Mood and Affect: Mood normal.        Behavior: Behavior normal.      Outpatient Encounter Medications as of 07/08/2022  Medication Sig   fluticasone (FLONASE) 50 MCG/ACT nasal spray Place into both nostrils daily as needed.   lisinopril (ZESTRIL) 10 MG tablet TAKE ONE TABLET BY MOUTH TWICE DAILY, INTHE MORNING AND AT BEDTIME.   sodium chloride (OCEAN) 0.65 % nasal spray Place 1 spray into the nose as needed for congestion.   [DISCONTINUED] amLODipine (NORVASC) 2.5 MG tablet TAKE 1 TABLET BY MOUTH DAILY   [DISCONTINUED] levothyroxine (SYNTHROID) 75 MCG tablet Take 1 tablet (75 mcg total) by mouth daily.   amLODipine (NORVASC) 2.5 MG tablet Take 1 tablet (2.5 mg total) by mouth daily.   levothyroxine (SYNTHROID) 75 MCG tablet Take 1 tablet (75 mcg total) by mouth daily.   No facility-administered encounter medications on file as of 07/08/2022.     Lab Results  Component Value Date   WBC 5.1 03/12/2022   HGB 13.8 03/12/2022   HCT 41.2  03/12/2022   PLT 190.0 03/12/2022   GLUCOSE 99 07/01/2022   CHOL 183 07/01/2022   TRIG 84.0 07/01/2022  HDL 48.60 07/01/2022   LDLCALC 118 (H) 07/01/2022   ALT 12 07/01/2022   AST 19 07/01/2022   NA 140 07/01/2022   K 4.4 07/01/2022   CL 103 07/01/2022   CREATININE 1.15 07/01/2022   BUN 16 07/01/2022   CO2 30 07/01/2022   TSH 0.89 07/01/2022    CT Head Wo Contrast  Result Date: 09/17/2019 CLINICAL DATA:  Poly trauma head and C-spine injury. EXAM: CT HEAD WITHOUT CONTRAST CT CERVICAL SPINE WITHOUT CONTRAST TECHNIQUE: Multidetector CT imaging of the head and cervical spine was performed following the standard protocol without intravenous contrast. Multiplanar CT image reconstructions of the cervical spine were also generated. COMPARISON:  None FINDINGS: CT HEAD FINDINGS Brain: No evidence of acute infarction, hemorrhage, hydrocephalus, extra-axial collection or mass lesion/mass effect. Signs of chronic microvascular ischemic change and atrophy. Vascular: No hyperdense vessel or unexpected calcification. Skull: Normal. Negative for fracture or focal lesion. Sinuses/Orbits: No acute finding. Other: Signs of right frontal hematoma. Small hematoma without underlying fracture. CT CERVICAL SPINE FINDINGS Alignment: Normal. Skull base and vertebrae: No acute fracture. No primary bone lesion or focal pathologic process. Soft tissues and spinal canal: No prevertebral fluid or swelling. No visible canal hematoma. Disc levels: Multilevel degenerative changes in the spine greatest at C5-6 and C6-7 with mild to moderate anterior osteophytes and uncovertebral degenerative changes. Upper chest: Negative. Other: None IMPRESSION: 1. No CT evidence for acute intracranial process. 2. Signs of chronic microvascular ischemic change and atrophy. 3. Signs of right frontal hematoma without underlying fracture. 4. No fracture or malalignment in the cervical spine. Electronically Signed   By: Zetta Bills M.D.   On:  09/17/2019 14:36   CT Cervical Spine Wo Contrast  Result Date: 09/17/2019 CLINICAL DATA:  Poly trauma head and C-spine injury. EXAM: CT HEAD WITHOUT CONTRAST CT CERVICAL SPINE WITHOUT CONTRAST TECHNIQUE: Multidetector CT imaging of the head and cervical spine was performed following the standard protocol without intravenous contrast. Multiplanar CT image reconstructions of the cervical spine were also generated. COMPARISON:  None FINDINGS: CT HEAD FINDINGS Brain: No evidence of acute infarction, hemorrhage, hydrocephalus, extra-axial collection or mass lesion/mass effect. Signs of chronic microvascular ischemic change and atrophy. Vascular: No hyperdense vessel or unexpected calcification. Skull: Normal. Negative for fracture or focal lesion. Sinuses/Orbits: No acute finding. Other: Signs of right frontal hematoma. Small hematoma without underlying fracture. CT CERVICAL SPINE FINDINGS Alignment: Normal. Skull base and vertebrae: No acute fracture. No primary bone lesion or focal pathologic process. Soft tissues and spinal canal: No prevertebral fluid or swelling. No visible canal hematoma. Disc levels: Multilevel degenerative changes in the spine greatest at C5-6 and C6-7 with mild to moderate anterior osteophytes and uncovertebral degenerative changes. Upper chest: Negative. Other: None IMPRESSION: 1. No CT evidence for acute intracranial process. 2. Signs of chronic microvascular ischemic change and atrophy. 3. Signs of right frontal hematoma without underlying fracture. 4. No fracture or malalignment in the cervical spine. Electronically Signed   By: Zetta Bills M.D.   On: 09/17/2019 14:36   DG Wrist Complete Right  Result Date: 09/17/2019 CLINICAL DATA:  Status post fall, right wrist pain EXAM: RIGHT WRIST - COMPLETE 3+ VIEW COMPARISON:  None. FINDINGS: Generalized osteopenia. Comminuted and impacted distal radial metaphysis fracture extending to the articular surface of the radiocarpal joint.  Nondisplaced fracture of the ulnar styloid process. Mild osteoarthritis of the first [facility] joint, first MCP joint and first IP joint. Soft tissue swelling around the right wrist. IMPRESSION: 1. Comminuted and  impacted distal radial metaphysis fracture extending to the articular surface of the radiocarpal joint. 2. Nondisplaced fracture of the ulnar styloid process. Electronically Signed   By: Kathreen Devoid   On: 09/17/2019 14:28   DG Ribs Unilateral W/Chest Right  Result Date: 09/17/2019 CLINICAL DATA:  Right rib pain. EXAM: RIGHT RIBS AND CHEST - 3+ VIEW COMPARISON:  None. FINDINGS: No fracture or other bone lesions are seen involving the ribs. There is no evidence of pneumothorax or pleural effusion. Both lungs are clear. Heart size and mediastinal contours are within normal limits. IMPRESSION: Negative. Electronically Signed   By: Kathreen Devoid   On: 09/17/2019 14:26       Assessment & Plan:  Routine general medical examination at a health care facility  Health care maintenance Assessment & Plan: Physical today 07/08/22..  Colonoscopy 2019 - Dr Hilarie Fredrickson.  F/u colonoscopy 05/2022.05/28/22 - Dr Hilarie Fredrickson - colonoscopy: Two 2 to 3 mm polyps in the ascending colon. Two 4 to 5 mm polyps in the ascending colon. Moderate diverticulosis in the sigmoid colon and in the descending colon. Internal hemorrhoids. Recommend f/u colonoscopy in 3 years.   Declines mammogram.    Stage 3a chronic kidney disease (HCC) Assessment & Plan: Avoid antiinflammatories.  Stay hydrated.  Follow metabolic panel.  Continue lisinopril.   Orders: -     Basic metabolic panel; Future  Essential hypertension, benign Assessment & Plan: Outside blood pressures as outlined.  Continue lisinopril and amlodipine.  Follow pressures.  Follow metabolic panel.   Orders: -     Basic metabolic panel; Future  Family history of malignant neoplasm of gastrointestinal tract Assessment & Plan: Colonoscopy 12/2017 - Dr Hilarie Fredrickson.  Continue f/u with  GI. Recommended f/u in 5 years.    History of colonic polyps Assessment & Plan: 05/28/22 - Dr Hilarie Fredrickson - colonoscopy: Two 2 to 3 mm polyps in the ascending colon. Two 4 to 5 mm polyps in the ascending colon. Moderate diverticulosis in the sigmoid colon and in the descending colon. Internal hemorrhoids. Recommend f/u colonoscopy in 3 years.    Hypercholesterolemia Assessment & Plan: Have discussed calculated cholesterol risk.  Have discussed recommendation to start a cholesterol medication.  She declines.  Low cholesterol diet and exercise.  Follow lipid panel and liver function tests.    Orders: -     Lipid panel; Future -     TSH; Future -     Hepatic function panel; Future  Hypothyroidism, unspecified type Assessment & Plan: On thyroid replacement.  Follow tsh.    Other orders -     amLODIPine Besylate; Take 1 tablet (2.5 mg total) by mouth daily.  Dispense: 90 tablet; Refill: 3 -     Levothyroxine Sodium; Take 1 tablet (75 mcg total) by mouth daily.  Dispense: 30 tablet; Refill: 5     Einar Pheasant, MD

## 2022-07-08 NOTE — Assessment & Plan Note (Addendum)
Physical today 07/08/22..  Colonoscopy 2019 - Dr Hilarie Fredrickson.  F/u colonoscopy 05/2022.05/28/22 - Dr Hilarie Fredrickson - colonoscopy: Two 2 to 3 mm polyps in the ascending colon. Two 4 to 5 mm polyps in the ascending colon. Moderate diverticulosis in the sigmoid colon and in the descending colon. Internal hemorrhoids. Recommend f/u colonoscopy in 3 years.   Declines mammogram.

## 2022-07-19 ENCOUNTER — Encounter: Payer: Self-pay | Admitting: Internal Medicine

## 2022-07-19 NOTE — Assessment & Plan Note (Signed)
Avoid antiinflammatories.  Stay hydrated.  Follow metabolic panel.  Continue lisinopril.

## 2022-07-19 NOTE — Assessment & Plan Note (Signed)
On thyroid replacement.  Follow tsh.  

## 2022-07-19 NOTE — Assessment & Plan Note (Signed)
Colonoscopy 12/2017 - Dr Hilarie Fredrickson.  Continue f/u with GI. Recommended f/u in 5 years.

## 2022-07-19 NOTE — Assessment & Plan Note (Signed)
Outside blood pressures as outlined.  Continue lisinopril and amlodipine.  Follow pressures.  Follow metabolic panel.

## 2022-07-19 NOTE — Assessment & Plan Note (Signed)
05/28/22 - Dr Hilarie Fredrickson - colonoscopy: Two 2 to 3 mm polyps in the ascending colon. Two 4 to 5 mm polyps in the ascending colon. Moderate diverticulosis in the sigmoid colon and in the descending colon. Internal hemorrhoids. Recommend f/u colonoscopy in 3 years.

## 2022-07-19 NOTE — Assessment & Plan Note (Signed)
Have discussed calculated cholesterol risk.  Have discussed recommendation to start a cholesterol medication.  She declines.  Low cholesterol diet and exercise.  Follow lipid panel and liver function tests.

## 2022-09-15 ENCOUNTER — Telehealth (HOSPITAL_BASED_OUTPATIENT_CLINIC_OR_DEPARTMENT_OTHER): Payer: Self-pay | Admitting: Internal Medicine

## 2022-09-15 NOTE — Telephone Encounter (Signed)
[place] to schedule their annual wellness visit. Patient declined to schedule AWV at this time.   Patient left message on my voice mail that she doesn't participate in the AWV.  Thank you,  [place] Direct dial  [phone]

## 2022-09-29 ENCOUNTER — Other Ambulatory Visit: Payer: Self-pay | Admitting: Internal Medicine

## 2022-10-05 DIAGNOSIS — H2513 Age-related nuclear cataract, bilateral: Secondary | ICD-10-CM | POA: Diagnosis not present

## 2022-10-05 DIAGNOSIS — H52223 Regular astigmatism, bilateral: Secondary | ICD-10-CM | POA: Diagnosis not present

## 2022-10-05 DIAGNOSIS — H5213 Myopia, bilateral: Secondary | ICD-10-CM | POA: Diagnosis not present

## 2022-10-05 DIAGNOSIS — H25013 Cortical age-related cataract, bilateral: Secondary | ICD-10-CM | POA: Diagnosis not present

## 2022-10-05 DIAGNOSIS — H524 Presbyopia: Secondary | ICD-10-CM | POA: Diagnosis not present

## 2022-10-19 ENCOUNTER — Other Ambulatory Visit: Payer: Self-pay | Admitting: Internal Medicine

## 2022-10-19 DIAGNOSIS — Z1231 Encounter for screening mammogram for malignant neoplasm of breast: Secondary | ICD-10-CM

## 2022-10-20 ENCOUNTER — Other Ambulatory Visit: Payer: Self-pay | Admitting: Internal Medicine

## 2022-11-09 ENCOUNTER — Other Ambulatory Visit (INDEPENDENT_AMBULATORY_CARE_PROVIDER_SITE_OTHER): Payer: Medicare HMO

## 2022-11-09 DIAGNOSIS — I10 Essential (primary) hypertension: Secondary | ICD-10-CM | POA: Diagnosis not present

## 2022-11-09 DIAGNOSIS — E7800 Pure hypercholesterolemia, unspecified: Secondary | ICD-10-CM | POA: Diagnosis not present

## 2022-11-09 DIAGNOSIS — N1831 Chronic kidney disease, stage 3a: Secondary | ICD-10-CM

## 2022-11-09 LAB — BASIC METABOLIC PANEL
BUN: 17 mg/dL (ref 6–23)
CO2: 27 mEq/L (ref 19–32)
Calcium: 9.6 mg/dL (ref 8.4–10.5)
Chloride: 104 mEq/L (ref 96–112)
Creatinine, Ser: 1.2 mg/dL (ref 0.40–1.20)
GFR: 44.37 mL/min — ABNORMAL LOW (ref >60.00)
Glucose, Bld: 105 mg/dL — ABNORMAL HIGH (ref 70–99)
Potassium: 4.4 mEq/L (ref 3.5–5.1)
Sodium: 138 mEq/L (ref 135–145)

## 2022-11-09 LAB — HEPATIC FUNCTION PANEL
ALT: 12 U/L (ref 0–35)
AST: 18 U/L (ref 0–37)
Albumin: 4.1 g/dL (ref 3.5–5.2)
Alkaline Phosphatase: 63 U/L (ref 39–117)
Bilirubin, Direct: 0.1 mg/dL (ref 0.0–0.3)
Total Bilirubin: 0.5 mg/dL (ref 0.2–1.2)
Total Protein: 7 g/dL (ref 6.0–8.3)

## 2022-11-09 LAB — LIPID PANEL
Cholesterol: 184 mg/dL (ref 0–200)
HDL: 46.1 mg/dL (ref >39.00)
LDL Cholesterol: 117 mg/dL — ABNORMAL HIGH (ref 0–99)
NonHDL: 137.89
Total CHOL/HDL Ratio: 4
Triglycerides: 103 mg/dL (ref 0.0–149.0)
VLDL: 20.6 mg/dL (ref 0.0–40.0)

## 2022-11-09 LAB — TSH: TSH: 0.18 u[IU]/mL — ABNORMAL LOW (ref 0.35–5.50)

## 2022-11-10 ENCOUNTER — Telehealth: Payer: Self-pay | Admitting: Internal Medicine

## 2022-11-10 NOTE — Telephone Encounter (Signed)
Contacted Ann Perkins to schedule their annual wellness visit. Patient declined to schedule AWV at this time.   Patient only wants to see PCP  Thank you,  [facility] Support [facility] Direct dial  [phone]

## 2022-11-11 ENCOUNTER — Ambulatory Visit (INDEPENDENT_AMBULATORY_CARE_PROVIDER_SITE_OTHER): Payer: Medicare HMO | Admitting: Internal Medicine

## 2022-11-11 ENCOUNTER — Encounter: Payer: Self-pay | Admitting: Internal Medicine

## 2022-11-11 VITALS — BP 114/66 | HR 88 | Temp 97.9°F | Resp 16 | Ht <= 58 in | Wt 127.6 lb

## 2022-11-11 DIAGNOSIS — N1831 Chronic kidney disease, stage 3a: Secondary | ICD-10-CM | POA: Diagnosis not present

## 2022-11-11 DIAGNOSIS — I10 Essential (primary) hypertension: Secondary | ICD-10-CM | POA: Diagnosis not present

## 2022-11-11 DIAGNOSIS — Z860100 Personal history of colon polyps, unspecified: Secondary | ICD-10-CM

## 2022-11-11 DIAGNOSIS — E7800 Pure hypercholesterolemia, unspecified: Secondary | ICD-10-CM | POA: Diagnosis not present

## 2022-11-11 DIAGNOSIS — E039 Hypothyroidism, unspecified: Secondary | ICD-10-CM

## 2022-11-11 DIAGNOSIS — Z9109 Other allergy status, other than to drugs and biological substances: Secondary | ICD-10-CM

## 2022-11-11 MED ORDER — LEVOTHYROXINE SODIUM 50 MCG PO TABS: 50.0000 ug | ORAL_TABLET | Freq: Every day | ORAL | 1 refills | Status: DC

## 2022-11-11 NOTE — Progress Notes (Signed)
Subjective:    Patient ID: Ann Perkins, female    DOB: [birth year], 77 y.o.   MRN: [id number]  Patient here for  Chief Complaint  Patient presents with   Medical Management of Chronic Issues    HPI Here for a follow up regarding hypertension, hypothyroidism and hypercholesterolemia.  Saw cardiology for f/u 11/27/21. Previous zio patch - SR - average heart rate 94 with rare ectopy. Felt stable. Has f/u next month.  Reports no problems.  Saw GI 04/30/22 - episode of rectal bleeding.  Recommended colonoscopy.  Colonoscopy 05/28/22.  Pt reports four polyps.  Recommended f/u in 4 years.  Discussed hemorrhoids and treatment.  No chest pain or sob reported.  No abdominal pain.  Is mowing.  Some minimal allergy symptoms.  Flonase controls.   Past Medical History:  Diagnosis Date   Allergy    GERD (gastroesophageal reflux disease)    Hemorrhoids    History of colon polyps    Hypertension    Thyroid disease    Past Surgical History:  Procedure Laterality Date   COLONOSCOPY  2016   MOUTH SURGERY     x 2   TONSILLECTOMY     WRIST SURGERY     Family History  Problem Relation Age of Onset   Hyperlipidemia Mother    Heart disease Mother        Pacemaker, heart attack   Diabetes Mother    Stroke Father    Heart disease Father        Angina   Colon cancer Sister        x 2 Half(Father Side)   Colon cancer Brother        Half(Father Side)   Colon cancer Cousin        Mother Side    Esophageal cancer Neg Hx    Rectal cancer Neg Hx    Stomach cancer Neg Hx    Social History   Socioeconomic History   Marital status: Single    Spouse name: Not on file   Number of children: Not on file   Years of education: Not on file   Highest education level: Not on file  Occupational History   Occupation: Retired  Tobacco Use   Smoking status: Never   Smokeless tobacco: Never  Vaping Use   Vaping Use: Never used  Substance and Sexual Activity   Alcohol use: No    Alcohol/week: 0.0  standard drinks of alcohol   Drug use: No   Sexual activity: Not Currently  Other Topics Concern   Not on file  Social History Narrative   Lives alone.  Never married.     Social Determinants of Health   Financial Resource Strain: Not on file  Food Insecurity: Not on file  Transportation Needs: Not on file  Physical Activity: Not on file  Stress: Not on file  Social Connections: Not on file     Review of Systems  Constitutional:  Negative for appetite change and unexpected weight change.  HENT:  Negative for congestion and sinus pressure.   Respiratory:  Negative for cough, chest tightness and shortness of breath.   Cardiovascular:  Negative for chest pain, palpitations and leg swelling.  Gastrointestinal:  Negative for abdominal pain, diarrhea, nausea and vomiting.  Genitourinary:  Negative for difficulty urinating and dysuria.  Musculoskeletal:  Negative for joint swelling and myalgias.  Skin:  Negative for color change and rash.  Neurological:  Negative for dizziness and headaches.  Psychiatric/Behavioral:  Negative  for agitation and dysphoric mood.        Objective:     BP 114/66   Pulse 88   Temp 97.9 F (36.6 C)   Resp 16   Ht 4\' 9"  (1.448 m)   Wt 127 lb 9.6 oz (57.9 kg)   LMP 07/21/1991   SpO2 98%   BMI 27.61 kg/m  Wt Readings from Last 3 Encounters:  11/11/22 127 lb 9.6 oz (57.9 kg)  07/08/22 126 lb (57.2 kg)  05/28/22 131 lb (59.4 kg)    Physical Exam Vitals reviewed.  Constitutional:      General: She is not in acute distress.    Appearance: Normal appearance.  HENT:     Head: Normocephalic and atraumatic.     Right Ear: External ear normal.     Left Ear: External ear normal.  Eyes:     General: No scleral icterus.       Right eye: No discharge.        Left eye: No discharge.     Conjunctiva/sclera: Conjunctivae normal.  Neck:     Thyroid: No thyromegaly.  Cardiovascular:     Rate and Rhythm: Normal rate and regular rhythm.  Pulmonary:      Effort: No respiratory distress.     Breath sounds: Normal breath sounds. No wheezing.  Abdominal:     General: Bowel sounds are normal.     Palpations: Abdomen is soft.     Tenderness: There is no abdominal tenderness.  Musculoskeletal:        General: No swelling or tenderness.     Cervical back: Neck supple. No tenderness.  Lymphadenopathy:     Cervical: No cervical adenopathy.  Skin:    Findings: No erythema or rash.  Neurological:     Mental Status: She is alert.  Psychiatric:        Mood and Affect: Mood normal.        Behavior: Behavior normal.      Outpatient Encounter Medications as of 11/11/2022  Medication Sig   amLODipine (NORVASC) 2.5 MG tablet Take 1 tablet (2.5 mg total) by mouth daily.   fluticasone (FLONASE) 50 MCG/ACT nasal spray Place into both nostrils daily as needed.   lisinopril (ZESTRIL) 10 MG tablet TAKE ONE TABLET BY MOUTH TWICE DAILY, INTHE MORNING AND AT BEDTIME.   sodium chloride (OCEAN) 0.65 % nasal spray Place 1 spray into the nose as needed for congestion.   [DISCONTINUED] levothyroxine (SYNTHROID) 50 MCG tablet Take 1 tablet (50 mcg total) by mouth daily.   [DISCONTINUED] levothyroxine (SYNTHROID) 75 MCG tablet Take 1 tablet (75 mcg total) by mouth daily.   levothyroxine (SYNTHROID) 50 MCG tablet Take 1 tablet (50 mcg total) by mouth daily.   No facility-administered encounter medications on file as of 11/11/2022.     Lab Results  Component Value Date   WBC 5.1 03/12/2022   HGB 13.8 03/12/2022   HCT 41.2 03/12/2022   PLT 190.0 03/12/2022   GLUCOSE 105 (H) 11/09/2022   CHOL 184 11/09/2022   TRIG 103.0 11/09/2022   HDL 46.10 11/09/2022   LDLCALC 117 (H) 11/09/2022   ALT 12 11/09/2022   AST 18 11/09/2022   NA 138 11/09/2022   K 4.4 11/09/2022   CL 104 11/09/2022   CREATININE 1.20 11/09/2022   BUN 17 11/09/2022   CO2 27 11/09/2022   TSH 0.18 (L) 11/09/2022    CT Head Wo Contrast  Result Date: 09/17/2019 CLINICAL DATA:  Poly  trauma  head and C-spine injury. EXAM: CT HEAD WITHOUT CONTRAST CT CERVICAL SPINE WITHOUT CONTRAST TECHNIQUE: Multidetector CT imaging of the head and cervical spine was performed following the standard protocol without intravenous contrast. Multiplanar CT image reconstructions of the cervical spine were also generated. COMPARISON:  None FINDINGS: CT HEAD FINDINGS Brain: No evidence of acute infarction, hemorrhage, hydrocephalus, extra-axial collection or mass lesion/mass effect. Signs of chronic microvascular ischemic change and atrophy. Vascular: No hyperdense vessel or unexpected calcification. Skull: Normal. Negative for fracture or focal lesion. Sinuses/Orbits: No acute finding. Other: Signs of right frontal hematoma. Small hematoma without underlying fracture. CT CERVICAL SPINE FINDINGS Alignment: Normal. Skull base and vertebrae: No acute fracture. No primary bone lesion or focal pathologic process. Soft tissues and spinal canal: No prevertebral fluid or swelling. No visible canal hematoma. Disc levels: Multilevel degenerative changes in the spine greatest at C5-6 and C6-7 with mild to moderate anterior osteophytes and uncovertebral degenerative changes. Upper chest: Negative. Other: None IMPRESSION: 1. No CT evidence for acute intracranial process. 2. Signs of chronic microvascular ischemic change and atrophy. 3. Signs of right frontal hematoma without underlying fracture. 4. No fracture or malalignment in the cervical spine. Electronically Signed   By: Donzetta Kohut M.D.   On: 09/17/2019 14:36   CT Cervical Spine Wo Contrast  Result Date: 09/17/2019 CLINICAL DATA:  Poly trauma head and C-spine injury. EXAM: CT HEAD WITHOUT CONTRAST CT CERVICAL SPINE WITHOUT CONTRAST TECHNIQUE: Multidetector CT imaging of the head and cervical spine was performed following the standard protocol without intravenous contrast. Multiplanar CT image reconstructions of the cervical spine were also generated. COMPARISON:  None  FINDINGS: CT HEAD FINDINGS Brain: No evidence of acute infarction, hemorrhage, hydrocephalus, extra-axial collection or mass lesion/mass effect. Signs of chronic microvascular ischemic change and atrophy. Vascular: No hyperdense vessel or unexpected calcification. Skull: Normal. Negative for fracture or focal lesion. Sinuses/Orbits: No acute finding. Other: Signs of right frontal hematoma. Small hematoma without underlying fracture. CT CERVICAL SPINE FINDINGS Alignment: Normal. Skull base and vertebrae: No acute fracture. No primary bone lesion or focal pathologic process. Soft tissues and spinal canal: No prevertebral fluid or swelling. No visible canal hematoma. Disc levels: Multilevel degenerative changes in the spine greatest at C5-6 and C6-7 with mild to moderate anterior osteophytes and uncovertebral degenerative changes. Upper chest: Negative. Other: None IMPRESSION: 1. No CT evidence for acute intracranial process. 2. Signs of chronic microvascular ischemic change and atrophy. 3. Signs of right frontal hematoma without underlying fracture. 4. No fracture or malalignment in the cervical spine. Electronically Signed   By: Donzetta Kohut M.D.   On: 09/17/2019 14:36   DG Wrist Complete Right  Result Date: 09/17/2019 CLINICAL DATA:  Status post fall, right wrist pain EXAM: RIGHT WRIST - COMPLETE 3+ VIEW COMPARISON:  None. FINDINGS: Generalized osteopenia. Comminuted and impacted distal radial metaphysis fracture extending to the articular surface of the radiocarpal joint. Nondisplaced fracture of the ulnar styloid process. Mild osteoarthritis of the first [facility] joint, first MCP joint and first IP joint. Soft tissue swelling around the right wrist. IMPRESSION: 1. Comminuted and impacted distal radial metaphysis fracture extending to the articular surface of the radiocarpal joint. 2. Nondisplaced fracture of the ulnar styloid process. Electronically Signed   By: Elige Ko   On: 09/17/2019 14:28   DG Ribs  Unilateral W/Chest Right  Result Date: 09/17/2019 CLINICAL DATA:  Right rib pain. EXAM: RIGHT RIBS AND CHEST - 3+ VIEW COMPARISON:  None. FINDINGS: No fracture or other bone lesions  are seen involving the ribs. There is no evidence of pneumothorax or pleural effusion. Both lungs are clear. Heart size and mediastinal contours are within normal limits. IMPRESSION: Negative. Electronically Signed   By: Elige Ko   On: 09/17/2019 14:26       Assessment & Plan:  Hypercholesterolemia Assessment & Plan: Discussed calculated cholesterol risk.  Discussed recommendation to start a cholesterol medication.  She declines.  Low cholesterol diet and exercise.  Follow lipid panel and liver function tests.   The 10-year ASCVD risk score (Arnett DK, et al., 2019) is: 16.9%   Values used to calculate the score:     Age: 76 years     Sex: Female     Is Non-Hispanic African American: No     Diabetic: No     Tobacco smoker: No     Systolic Blood Pressure: 114 mmHg     Is BP treated: Yes     HDL Cholesterol: 46.1 mg/dL     Total Cholesterol: 184 mg/dL   Orders: -     Lipid panel; Future -     Hepatic function panel; Future  Essential hypertension, benign Assessment & Plan: Continue lisinopril and amlodipine.  Follow pressures.  Follow metabolic panel.   Orders: -     Basic metabolic panel; Future -     TSH; Future -     CBC with Differential/Platelet; Future  Hypothyroidism, unspecified type Assessment & Plan: On thyroid replacement.  Follow tsh. Adjust dose.  Recent TSH suppressed.  Change to q day.   Orders: -     TSH; Future  Stage 3a chronic kidney disease (HCC) Assessment & Plan: Avoid antiinflammatories.  Stay hydrated.  Follow metabolic panel.  Continue lisinopril.    Environmental allergies Assessment & Plan: Continue flonase.  Controlling.    History of colonic polyps Assessment & Plan: 05/28/22 - Dr Rhea Belton - colonoscopy: Two 2 to 3 mm polyps in the ascending colon.  Two 4 to 5 mm polyps in the ascending colon. Moderate diverticulosis in the sigmoid colon and in the descending colon. Internal hemorrhoids. Recommend f/u colonoscopy in 3 years.    Other orders -     Levothyroxine Sodium; Take 1 tablet (50 mcg total) by mouth daily.  Dispense: 30 tablet; Refill: 1     Dale Millman, MD

## 2022-11-11 NOTE — Assessment & Plan Note (Addendum)
Discussed calculated cholesterol risk.  Discussed recommendation to start a cholesterol medication.  She declines.  Low cholesterol diet and exercise.  Follow lipid panel and liver function tests.   The 10-year ASCVD risk score (Arnett DK, et al., 2019) is: 16.9%   Values used to calculate the score:     Age: 77 years     Sex: Female     Is Non-Hispanic African American: No     Diabetic: No     Tobacco smoker: No     Systolic Blood Pressure: 114 mmHg     Is BP treated: Yes     HDL Cholesterol: 46.1 mg/dL     Total Cholesterol: 184 mg/dL

## 2022-11-11 NOTE — Patient Instructions (Signed)
Stop synthroid   Start synthroid - one per day.

## 2022-11-15 ENCOUNTER — Encounter: Payer: Self-pay | Admitting: Internal Medicine

## 2022-11-15 NOTE — Assessment & Plan Note (Signed)
05/28/22 - Dr Pyrtle - colonoscopy: Two 2 to 3 mm polyps in the ascending colon. Two 4 to 5 mm polyps in the ascending colon. Moderate diverticulosis in the sigmoid colon and in the descending colon. Internal hemorrhoids. Recommend f/u colonoscopy in 3 years.  

## 2022-11-15 NOTE — Assessment & Plan Note (Signed)
Continue flonase.  Controlling.

## 2022-11-15 NOTE — Assessment & Plan Note (Signed)
Avoid antiinflammatories.  Stay hydrated.  Follow metabolic panel.  Continue lisinopril.  

## 2022-11-15 NOTE — Assessment & Plan Note (Signed)
Continue lisinopril and amlodipine.  Follow pressures.  Follow metabolic panel.  

## 2022-11-15 NOTE — Assessment & Plan Note (Addendum)
On thyroid replacement.  Follow tsh. Adjust dose.  Recent TSH suppressed.  Change to q day.

## 2022-12-17 NOTE — Progress Notes (Signed)
  Cardiology Office Note:   Date:  12/18/2022  ID:  Ann Perkins, DOB [birth year], MRN [id number] PCP: Dale Suski, MD  [facility] Providers Cardiologist:  Rollene Rotunda, MD   History of Present Illness:   Ann Perkins is a 77 y.o. female  who was referred by Dale Laube, MD for evaluation of HTN and an abnormal EKG.  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  EKG from her primary provider where there was a PVC.  She wore a monitor and had no arrhythmias noted.   I have not seen her since 2022.    She is done well since I last saw her. The patient denies any new symptoms such as chest discomfort, neck or arm discomfort. There has been no new shortness of breath, PND or orthopnea. There have been no reported palpitations, presyncope or syncope.  Her blood pressure at home is always well-controlled and she brings in the low Blood pressure diary with systolics in the 110s and diastolics in the 70s.  She is not having the dizziness that she described previously.  She stays active.  She still mowing her lawn pushing her mower.   ROS: As stated in the HPI and negative for all other systems.  Studies Reviewed:    EKG: Sinus rhythm, rate 79, right bundle branch block, left anterior fascicular block, no change from previous.   Risk Assessment/Calculations:          Physical Exam:   VS:  BP (!) 162/78 (BP Location: Left Arm, Patient Position: Sitting, Cuff Size: Normal)   Pulse 79   Ht 4\' 9"  (1.448 m)   Wt 130 lb 12.8 oz (59.3 kg)   LMP 07/21/1991   SpO2 94%   BMI 28.30 kg/m    Wt Readings from Last 3 Encounters:  12/18/22 130 lb 12.8 oz (59.3 kg)  11/11/22 127 lb 9.6 oz (57.9 kg)  07/08/22 126 lb (57.2 kg)     GEN: Well nourished, well developed in no acute distress NECK: No JVD; No carotid bruits CARDIAC: RRR, no murmurs, rubs, gallops RESPIRATORY:  Clear to auscultation without rales, wheezing or rhonchi  ABDOMEN: Soft,  non-tender, non-distended EXTREMITIES:  No edema; No deformity   ASSESSMENT AND PLAN:   HTN: The blood pressure is elevated today but it is always very well-controlled at home.  No change in therapy.  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  We again had a long discussion about the symptoms that might happen if she has progressive heart block.  She has had none of these but would let me know.        Signed, Rollene Rotunda, MD

## 2022-12-18 ENCOUNTER — Ambulatory Visit: Payer: Medicare HMO | Attending: Cardiology | Admitting: Cardiology

## 2022-12-18 ENCOUNTER — Encounter: Payer: Self-pay | Admitting: Cardiology

## 2022-12-18 VITALS — BP 162/78 | HR 79 | Ht <= 58 in | Wt 130.8 lb

## 2022-12-18 DIAGNOSIS — I4510 Unspecified right bundle-branch block: Secondary | ICD-10-CM

## 2022-12-18 DIAGNOSIS — I444 Left anterior fascicular block: Secondary | ICD-10-CM

## 2022-12-18 NOTE — Patient Instructions (Signed)
Medication Instructions:  Continue same medications *If you need a refill on your cardiac medications before your next appointment, please call your pharmacy*   Lab Work: None ordered   Testing/Procedures: None ordered    Follow-Up: At [facility], you and your health needs are our priority.  As part of our continuing mission to provide you with exceptional heart care, we have created designated Provider Care Teams.  These Care Teams include your primary Cardiologist (physician) and Advanced Practice Providers (APPs -  Physician Assistants and Nurse Practitioners) who all work together to provide you with the care you need, when you need it.  We recommend signing up for the patient portal called "MyChart".  Sign up information is provided on this After Visit Summary.  MyChart is used to connect with patients for Virtual Visits (Telemedicine).  Patients are able to view lab/test results, encounter notes, upcoming appointments, etc.  Non-urgent messages can be sent to your provider as well.   To learn more about what you can do with MyChart, go to [email or web address].    Your next appointment:  2 years    May 2026    Provider:  Dr.Hochrein

## 2022-12-23 ENCOUNTER — Other Ambulatory Visit (INDEPENDENT_AMBULATORY_CARE_PROVIDER_SITE_OTHER): Payer: Medicare HMO

## 2022-12-23 DIAGNOSIS — I10 Essential (primary) hypertension: Secondary | ICD-10-CM | POA: Diagnosis not present

## 2022-12-23 DIAGNOSIS — E7800 Pure hypercholesterolemia, unspecified: Secondary | ICD-10-CM

## 2022-12-23 DIAGNOSIS — E039 Hypothyroidism, unspecified: Secondary | ICD-10-CM

## 2022-12-23 LAB — HEPATIC FUNCTION PANEL
ALT: 15 U/L (ref 0–35)
AST: 22 U/L (ref 0–37)
Albumin: 4 g/dL (ref 3.5–5.2)
Alkaline Phosphatase: 56 U/L (ref 39–117)
Bilirubin, Direct: 0.1 mg/dL (ref 0.0–0.3)
Total Bilirubin: 0.5 mg/dL (ref 0.2–1.2)
Total Protein: 7.1 g/dL (ref 6.0–8.3)

## 2022-12-23 LAB — CBC WITH DIFFERENTIAL/PLATELET
Basophils Absolute: 0 10*3/uL (ref 0.0–0.1)
Basophils Relative: 0.8 % (ref 0.0–3.0)
Eosinophils Absolute: 0.2 10*3/uL (ref 0.0–0.7)
Eosinophils Relative: 3.2 % (ref 0.0–5.0)
HCT: 39.2 % (ref 36.0–46.0)
Hemoglobin: 12.9 g/dL (ref 12.0–15.0)
Lymphocytes Relative: 27.2 % (ref 12.0–46.0)
Lymphs Abs: 1.4 10*3/uL (ref 0.7–4.0)
MCHC: 32.9 g/dL (ref 30.0–36.0)
MCV: 91.4 fl (ref 78.0–100.0)
Monocytes Absolute: 0.4 10*3/uL (ref 0.1–1.0)
Monocytes Relative: 8 % (ref 3.0–12.0)
Neutro Abs: 3.2 10*3/uL (ref 1.4–7.7)
Neutrophils Relative %: 60.8 % (ref 43.0–77.0)
Platelets: 213 10*3/uL (ref 150.0–400.0)
RBC: 4.28 Mil/uL (ref 3.87–5.11)
RDW: 13.7 % (ref 11.5–15.5)
WBC: 5.2 10*3/uL (ref 4.0–10.5)

## 2022-12-23 LAB — BASIC METABOLIC PANEL
BUN: 16 mg/dL (ref 6–23)
CO2: 22 mEq/L (ref 19–32)
Calcium: 9.4 mg/dL (ref 8.4–10.5)
Chloride: 104 mEq/L (ref 96–112)
Creatinine, Ser: 1.24 mg/dL — ABNORMAL HIGH (ref 0.40–1.20)
GFR: 42.62 mL/min — ABNORMAL LOW (ref >60.00)
Glucose, Bld: 119 mg/dL — ABNORMAL HIGH (ref 70–99)
Potassium: 4.1 mEq/L (ref 3.5–5.1)
Sodium: 139 mEq/L (ref 135–145)

## 2022-12-23 LAB — TSH: TSH: 16.22 u[IU]/mL — ABNORMAL HIGH (ref 0.35–5.50)

## 2022-12-23 LAB — LIPID PANEL
Cholesterol: 188 mg/dL (ref 0–200)
HDL: 47.4 mg/dL (ref >39.00)
LDL Cholesterol: 111 mg/dL — ABNORMAL HIGH (ref 0–99)
NonHDL: 140.23
Total CHOL/HDL Ratio: 4
Triglycerides: 145 mg/dL (ref 0.0–149.0)
VLDL: 29 mg/dL (ref 0.0–40.0)

## 2022-12-25 ENCOUNTER — Telehealth: Payer: Self-pay | Admitting: Internal Medicine

## 2022-12-25 ENCOUNTER — Other Ambulatory Visit: Payer: Self-pay

## 2022-12-25 MED ORDER — LEVOTHYROXINE SODIUM 75 MCG PO TABS: 75.0000 ug | ORAL_TABLET | Freq: Every day | ORAL | 0 refills | Status: DC

## 2022-12-25 NOTE — Telephone Encounter (Signed)
See result note.  

## 2022-12-25 NOTE — Telephone Encounter (Signed)
Pt called requesting lab results

## 2022-12-28 ENCOUNTER — Telehealth: Payer: Self-pay | Admitting: Internal Medicine

## 2022-12-28 NOTE — Telephone Encounter (Signed)
See result note.  

## 2022-12-28 NOTE — Telephone Encounter (Signed)
Pt returning call to cma 

## 2023-01-20 ENCOUNTER — Other Ambulatory Visit: Payer: Self-pay | Admitting: Internal Medicine

## 2023-02-02 ENCOUNTER — Telehealth: Payer: Self-pay | Admitting: Internal Medicine

## 2023-02-02 ENCOUNTER — Telehealth: Payer: Self-pay

## 2023-02-02 DIAGNOSIS — E039 Hypothyroidism, unspecified: Secondary | ICD-10-CM

## 2023-02-02 DIAGNOSIS — R944 Abnormal results of kidney function studies: Secondary | ICD-10-CM

## 2023-02-02 NOTE — Telephone Encounter (Signed)
 Orders placed.

## 2023-02-02 NOTE — Telephone Encounter (Signed)
Patient need lab orders.

## 2023-02-10 ENCOUNTER — Other Ambulatory Visit (INDEPENDENT_AMBULATORY_CARE_PROVIDER_SITE_OTHER): Payer: Medicare HMO

## 2023-02-10 DIAGNOSIS — E039 Hypothyroidism, unspecified: Secondary | ICD-10-CM

## 2023-02-10 DIAGNOSIS — R944 Abnormal results of kidney function studies: Secondary | ICD-10-CM

## 2023-02-10 LAB — BASIC METABOLIC PANEL
BUN: 14 mg/dL (ref 6–23)
CO2: 24 mEq/L (ref 19–32)
Calcium: 9.8 mg/dL (ref 8.4–10.5)
Chloride: 105 mEq/L (ref 96–112)
Creatinine, Ser: 1.13 mg/dL (ref 0.40–1.20)
GFR: 47.6 mL/min — ABNORMAL LOW (ref >60.00)
Glucose, Bld: 108 mg/dL — ABNORMAL HIGH (ref 70–99)
Potassium: 4.2 mEq/L (ref 3.5–5.1)
Sodium: 138 mEq/L (ref 135–145)

## 2023-02-10 LAB — TSH: TSH: 0.96 u[IU]/mL (ref 0.35–5.50)

## 2023-03-10 ENCOUNTER — Telehealth: Payer: Self-pay | Admitting: Internal Medicine

## 2023-03-10 NOTE — Telephone Encounter (Signed)
Patient need lab orders.

## 2023-03-11 ENCOUNTER — Telehealth: Payer: Self-pay | Admitting: Internal Medicine

## 2023-03-11 NOTE — Telephone Encounter (Signed)
Please notify Ms Desilets that she had fasting labs <55months.  Too soon to do her f/u labs.  Please notify her that she does not need to come to her lab appt and please cancel lab appt.

## 2023-03-11 NOTE — Telephone Encounter (Signed)
Patient just called and she would like to talk about her lab work. She said she doesn't think she on the right dosage of her medicine. Her number is [phone]. Could someone call her back. She said if she doesn't get to her phone. Could you leave a detailed message.

## 2023-03-11 NOTE — Telephone Encounter (Signed)
Left message on voicemail to notify pt the lab appt will be cancelled because its too soon for labwork.

## 2023-03-12 NOTE — Telephone Encounter (Signed)
Pt stated that she feels she is on the wrong thyroid dose. Her labs were stable in July & we cancelled her lab appt next week. I reminded her that she is scheduled to see Dr. Lorin Picket on Wed 8/28 @ 9:30 & can discuss this at that time. I also told her that any labs that need to be drawn can also be drawn after her office visit if needed.  Pt verbalized understanding & appt was confirmed

## 2023-03-15 ENCOUNTER — Other Ambulatory Visit: Payer: Medicare HMO

## 2023-03-17 ENCOUNTER — Encounter: Payer: Self-pay | Admitting: Internal Medicine

## 2023-03-17 ENCOUNTER — Ambulatory Visit: Payer: Medicare HMO | Admitting: Internal Medicine

## 2023-03-17 VITALS — BP 122/72 | HR 90 | Temp 98.0°F | Ht <= 58 in | Wt 127.8 lb

## 2023-03-17 DIAGNOSIS — M79676 Pain in unspecified toe(s): Secondary | ICD-10-CM

## 2023-03-17 DIAGNOSIS — Z860100 Personal history of colon polyps, unspecified: Secondary | ICD-10-CM

## 2023-03-17 DIAGNOSIS — N1831 Chronic kidney disease, stage 3a: Secondary | ICD-10-CM

## 2023-03-17 DIAGNOSIS — E039 Hypothyroidism, unspecified: Secondary | ICD-10-CM | POA: Diagnosis not present

## 2023-03-17 DIAGNOSIS — E7800 Pure hypercholesterolemia, unspecified: Secondary | ICD-10-CM | POA: Diagnosis not present

## 2023-03-17 DIAGNOSIS — I10 Essential (primary) hypertension: Secondary | ICD-10-CM | POA: Diagnosis not present

## 2023-03-17 LAB — TSH: TSH: 0.33 u[IU]/mL — ABNORMAL LOW (ref 0.35–5.50)

## 2023-03-17 NOTE — Progress Notes (Signed)
Subjective:    Patient ID: Ann Perkins, female    DOB: [birth year], 77 y.o.   MRN: [id number]  Patient here for  Chief Complaint  Patient presents with   Medical Management of Chronic Issues    HPI Here for a follow up regarding hypertension, hypothyroidism and hypercholesterolemia.  Saw cardiology for f/u 11/27/21. Previous zio patch - SR - average heart rate 94 with rare ectopy. Had f/u 12/18/22 - stable. Saw GI 04/30/22 - episode of rectal bleeding.  Recommended colonoscopy.  Colonoscopy 05/28/22.  Pt reports four polyps.  Recommended f/u in 4 years.  Reports she is doing relatively well. No chest pain or sob reported.  No cough or congestion.  No abdominal pain or bowel change reported.  Had questions about her thyroid medication.  Wants to confirm on correct dose.  Wants level checked today.  She did hit her fifth toe - right foot - kicking at a bug.  Pain is better, but still sore.  Bruising resolved. Discussed xray.  Desires no further evaluation at this time.  Reviewed outside blood pressures - averaging mostly 100-130/60-70s.     Past Medical History:  Diagnosis Date   Allergy    GERD (gastroesophageal reflux disease)    Hemorrhoids    History of colon polyps    Hypertension    Thyroid disease    Past Surgical History:  Procedure Laterality Date   COLONOSCOPY  2016   MOUTH SURGERY     x 2   TONSILLECTOMY     WRIST SURGERY     Family History  Problem Relation Age of Onset   Hyperlipidemia Mother    Heart disease Mother        Pacemaker, heart attack   Diabetes Mother    Stroke Father    Heart disease Father        Angina   Colon cancer Sister        x 2 Half(Father Side)   Colon cancer Brother        Half(Father Side)   Colon cancer Cousin        Mother Side    Esophageal cancer Neg Hx    Rectal cancer Neg Hx    Stomach cancer Neg Hx    Social History   Socioeconomic History   Marital status: Single    Spouse name: Not on file   Number of children: Not on  file   Years of education: Not on file   Highest education level: Not on file  Occupational History   Occupation: Retired  Tobacco Use   Smoking status: Never   Smokeless tobacco: Never  Vaping Use   Vaping status: Never Used  Substance and Sexual Activity   Alcohol use: No    Alcohol/week: 0.0 standard drinks of alcohol   Drug use: No   Sexual activity: Not Currently  Other Topics Concern   Not on file  Social History Narrative   Lives alone.  Never married.     Social Determinants of Health   Financial Resource Strain: Not on file  Food Insecurity: Not on file  Transportation Needs: Not on file  Physical Activity: Not on file  Stress: Not on file  Social Connections: Not on file     Review of Systems  Constitutional:  Negative for appetite change and unexpected weight change.  HENT:  Negative for congestion and sinus pressure.   Respiratory:  Negative for cough, chest tightness and shortness of breath.   Cardiovascular:  Negative for chest pain, palpitations and leg swelling.  Gastrointestinal:  Negative for abdominal pain, diarrhea, nausea and vomiting.  Genitourinary:  Negative for difficulty urinating and dysuria.  Musculoskeletal:  Negative for joint swelling and myalgias.       Minimal tenderness - right fifth toe.    Skin:  Negative for color change and rash.  Neurological:  Negative for dizziness and headaches.  Psychiatric/Behavioral:  Negative for agitation and dysphoric mood.        Objective:     BP 122/72   Pulse 90   Temp 98 F (36.7 C) (Oral)   Ht 4\' 9"  (1.448 m)   Wt 127 lb 12.8 oz (58 kg)   LMP 07/21/1991   SpO2 100%   BMI 27.66 kg/m  Wt Readings from Last 3 Encounters:  03/17/23 127 lb 12.8 oz (58 kg)  12/18/22 130 lb 12.8 oz (59.3 kg)  11/11/22 127 lb 9.6 oz (57.9 kg)    Physical Exam Vitals reviewed.  Constitutional:      General: She is not in acute distress.    Appearance: Normal appearance.  HENT:     Head: Normocephalic  and atraumatic.     Right Ear: External ear normal.     Left Ear: External ear normal.  Eyes:     General: No scleral icterus.       Right eye: No discharge.        Left eye: No discharge.     Conjunctiva/sclera: Conjunctivae normal.  Neck:     Thyroid: No thyromegaly.  Cardiovascular:     Rate and Rhythm: Normal rate and regular rhythm.  Pulmonary:     Effort: No respiratory distress.     Breath sounds: Normal breath sounds. No wheezing.  Abdominal:     General: Bowel sounds are normal.     Palpations: Abdomen is soft.     Tenderness: There is no abdominal tenderness.  Musculoskeletal:        General: No swelling.     Cervical back: Neck supple. No tenderness.     Comments: Minimal tenderness right fifth toe.  No increased bruising.   Lymphadenopathy:     Cervical: No cervical adenopathy.  Skin:    Findings: No erythema or rash.  Neurological:     Mental Status: She is alert.  Psychiatric:        Mood and Affect: Mood normal.        Behavior: Behavior normal.      Outpatient Encounter Medications as of 03/17/2023  Medication Sig   amLODipine (NORVASC) 2.5 MG tablet Take 1 tablet (2.5 mg total) by mouth daily.   fluticasone (FLONASE) 50 MCG/ACT nasal spray Place into both nostrils daily as needed.   levothyroxine (SYNTHROID) 75 MCG tablet Take 1 tablet (75 mcg total) by mouth daily.   lisinopril (ZESTRIL) 10 MG tablet TAKE ONE TABLET BY MOUTH TWICE DAILY, INTHE MORNING AND AT BEDTIME.   sodium chloride (OCEAN) 0.65 % nasal spray Place 1 spray into the nose as needed for congestion.   No facility-administered encounter medications on file as of 03/17/2023.     Lab Results  Component Value Date   WBC 5.2 12/23/2022   HGB 12.9 12/23/2022   HCT 39.2 12/23/2022   PLT 213.0 12/23/2022   GLUCOSE 108 (H) 02/10/2023   CHOL 188 12/23/2022   TRIG 145.0 12/23/2022   HDL 47.40 12/23/2022   LDLCALC 111 (H) 12/23/2022   ALT 15 12/23/2022   AST 22 12/23/2022  NA 138  02/10/2023   K 4.2 02/10/2023   CL 105 02/10/2023   CREATININE 1.13 02/10/2023   BUN 14 02/10/2023   CO2 24 02/10/2023   TSH 0.33 (L) 03/17/2023    CT Head Wo Contrast  Result Date: 09/17/2019 CLINICAL DATA:  Poly trauma head and C-spine injury. EXAM: CT HEAD WITHOUT CONTRAST CT CERVICAL SPINE WITHOUT CONTRAST TECHNIQUE: Multidetector CT imaging of the head and cervical spine was performed following the standard protocol without intravenous contrast. Multiplanar CT image reconstructions of the cervical spine were also generated. COMPARISON:  None FINDINGS: CT HEAD FINDINGS Brain: No evidence of acute infarction, hemorrhage, hydrocephalus, extra-axial collection or mass lesion/mass effect. Signs of chronic microvascular ischemic change and atrophy. Vascular: No hyperdense vessel or unexpected calcification. Skull: Normal. Negative for fracture or focal lesion. Sinuses/Orbits: No acute finding. Other: Signs of right frontal hematoma. Small hematoma without underlying fracture. CT CERVICAL SPINE FINDINGS Alignment: Normal. Skull base and vertebrae: No acute fracture. No primary bone lesion or focal pathologic process. Soft tissues and spinal canal: No prevertebral fluid or swelling. No visible canal hematoma. Disc levels: Multilevel degenerative changes in the spine greatest at C5-6 and C6-7 with mild to moderate anterior osteophytes and uncovertebral degenerative changes. Upper chest: Negative. Other: None IMPRESSION: 1. No CT evidence for acute intracranial process. 2. Signs of chronic microvascular ischemic change and atrophy. 3. Signs of right frontal hematoma without underlying fracture. 4. No fracture or malalignment in the cervical spine. Electronically Signed   By: Donzetta Kohut M.D.   On: 09/17/2019 14:36   CT Cervical Spine Wo Contrast  Result Date: 09/17/2019 CLINICAL DATA:  Poly trauma head and C-spine injury. EXAM: CT HEAD WITHOUT CONTRAST CT CERVICAL SPINE WITHOUT CONTRAST TECHNIQUE:  Multidetector CT imaging of the head and cervical spine was performed following the standard protocol without intravenous contrast. Multiplanar CT image reconstructions of the cervical spine were also generated. COMPARISON:  None FINDINGS: CT HEAD FINDINGS Brain: No evidence of acute infarction, hemorrhage, hydrocephalus, extra-axial collection or mass lesion/mass effect. Signs of chronic microvascular ischemic change and atrophy. Vascular: No hyperdense vessel or unexpected calcification. Skull: Normal. Negative for fracture or focal lesion. Sinuses/Orbits: No acute finding. Other: Signs of right frontal hematoma. Small hematoma without underlying fracture. CT CERVICAL SPINE FINDINGS Alignment: Normal. Skull base and vertebrae: No acute fracture. No primary bone lesion or focal pathologic process. Soft tissues and spinal canal: No prevertebral fluid or swelling. No visible canal hematoma. Disc levels: Multilevel degenerative changes in the spine greatest at C5-6 and C6-7 with mild to moderate anterior osteophytes and uncovertebral degenerative changes. Upper chest: Negative. Other: None IMPRESSION: 1. No CT evidence for acute intracranial process. 2. Signs of chronic microvascular ischemic change and atrophy. 3. Signs of right frontal hematoma without underlying fracture. 4. No fracture or malalignment in the cervical spine. Electronically Signed   By: Donzetta Kohut M.D.   On: 09/17/2019 14:36   DG Wrist Complete Right  Result Date: 09/17/2019 CLINICAL DATA:  Status post fall, right wrist pain EXAM: RIGHT WRIST - COMPLETE 3+ VIEW COMPARISON:  None. FINDINGS: Generalized osteopenia. Comminuted and impacted distal radial metaphysis fracture extending to the articular surface of the radiocarpal joint. Nondisplaced fracture of the ulnar styloid process. Mild osteoarthritis of the first [facility] joint, first MCP joint and first IP joint. Soft tissue swelling around the right wrist. IMPRESSION: 1. Comminuted and impacted  distal radial metaphysis fracture extending to the articular surface of the radiocarpal joint. 2. Nondisplaced fracture of the  ulnar styloid process. Electronically Signed   By: Elige Ko   On: 09/17/2019 14:28   DG Ribs Unilateral W/Chest Right  Result Date: 09/17/2019 CLINICAL DATA:  Right rib pain. EXAM: RIGHT RIBS AND CHEST - 3+ VIEW COMPARISON:  None. FINDINGS: No fracture or other bone lesions are seen involving the ribs. There is no evidence of pneumothorax or pleural effusion. Both lungs are clear. Heart size and mediastinal contours are within normal limits. IMPRESSION: Negative. Electronically Signed   By: Elige Ko   On: 09/17/2019 14:26       Assessment & Plan:  Hypothyroidism, unspecified type Assessment & Plan: On thyroid replacement.  Check tsh today.    Orders: -     TSH  Stage 3a chronic kidney disease (HCC) Assessment & Plan: Avoid antiinflammatories.  Stay hydrated.  Follow metabolic panel.  Continue lisinopril.    Essential hypertension, benign Assessment & Plan: Continue lisinopril and amlodipine.  Blood pressures as outlined.  Hold on making any changes. Continue to follow pressures.  Follow metabolic panel.    History of colonic polyps Assessment & Plan: 05/28/22 - Dr Rhea Belton - colonoscopy: Two 2 to 3 mm polyps in the ascending colon. Two 4 to 5 mm polyps in the ascending colon. Moderate diverticulosis in the sigmoid colon and in the descending colon. Internal hemorrhoids. Recommend f/u colonoscopy in 3 years.    Hypercholesterolemia Assessment & Plan: Discussed calculated cholesterol risk.  Discussed recommendation to start a cholesterol medication.  She declines.  Low cholesterol diet and exercise.  Follow lipid panel and liver function tests.   The 10-year ASCVD risk score (Arnett DK, et al., 2019) is: 19.2%   Values used to calculate the score:     Age: 86 years     Sex: Female     Is Non-Hispanic African American: No     Diabetic: No     Tobacco  smoker: No     Systolic Blood Pressure: 122 mmHg     Is BP treated: Yes     HDL Cholesterol: 47.4 mg/dL     Total Cholesterol: 188 mg/dL    Pain of fifth toe Assessment & Plan: Recent injury as outlined.  No bruising.  Is better. Discussed xray. Wants to hold.  Will follow. Notify if persistent problem.       Dale Guillet, MD

## 2023-03-18 ENCOUNTER — Other Ambulatory Visit: Payer: Self-pay

## 2023-03-18 DIAGNOSIS — E039 Hypothyroidism, unspecified: Secondary | ICD-10-CM

## 2023-03-19 ENCOUNTER — Other Ambulatory Visit: Payer: Self-pay | Admitting: Internal Medicine

## 2023-03-22 ENCOUNTER — Encounter: Payer: Self-pay | Admitting: Internal Medicine

## 2023-03-22 DIAGNOSIS — M79676 Pain in unspecified toe(s): Secondary | ICD-10-CM | POA: Insufficient documentation

## 2023-03-22 NOTE — Assessment & Plan Note (Signed)
Continue lisinopril and amlodipine.  Blood pressures as outlined.  Hold on making any changes. Continue to follow pressures.  Follow metabolic panel.

## 2023-03-22 NOTE — Assessment & Plan Note (Signed)
Avoid antiinflammatories.  Stay hydrated.  Follow metabolic panel.  Continue lisinopril.

## 2023-03-22 NOTE — Assessment & Plan Note (Signed)
On thyroid replacement.  Check tsh today.

## 2023-03-22 NOTE — Assessment & Plan Note (Signed)
Discussed calculated cholesterol risk.  Discussed recommendation to start a cholesterol medication.  She declines.  Low cholesterol diet and exercise.  Follow lipid panel and liver function tests.   The 10-year ASCVD risk score (Arnett DK, et al., 2019) is: 19.2%   Values used to calculate the score:     Age: 77 years     Sex: Female     Is Non-Hispanic African American: No     Diabetic: No     Tobacco smoker: No     Systolic Blood Pressure: 122 mmHg     Is BP treated: Yes     HDL Cholesterol: 47.4 mg/dL     Total Cholesterol: 188 mg/dL

## 2023-03-22 NOTE — Assessment & Plan Note (Signed)
Recent injury as outlined.  No bruising.  Is better. Discussed xray. Wants to hold.  Will follow. Notify if persistent problem.

## 2023-03-22 NOTE — Assessment & Plan Note (Signed)
05/28/22 - Dr Hilarie Fredrickson - colonoscopy: Two 2 to 3 mm polyps in the ascending colon. Two 4 to 5 mm polyps in the ascending colon. Moderate diverticulosis in the sigmoid colon and in the descending colon. Internal hemorrhoids. Recommend f/u colonoscopy in 3 years.

## 2023-04-28 ENCOUNTER — Ambulatory Visit: Payer: Medicare HMO

## 2023-04-28 ENCOUNTER — Other Ambulatory Visit (INDEPENDENT_AMBULATORY_CARE_PROVIDER_SITE_OTHER): Payer: Medicare HMO

## 2023-04-28 DIAGNOSIS — E039 Hypothyroidism, unspecified: Secondary | ICD-10-CM

## 2023-04-28 LAB — TSH: TSH: 3.52 u[IU]/mL (ref 0.35–5.50)

## 2023-05-05 ENCOUNTER — Ambulatory Visit (INDEPENDENT_AMBULATORY_CARE_PROVIDER_SITE_OTHER): Payer: Medicare HMO

## 2023-05-05 DIAGNOSIS — Z23 Encounter for immunization: Secondary | ICD-10-CM

## 2023-05-05 NOTE — Progress Notes (Signed)
Patient presented for High Dose Flu  injection to left deltoid, patient voiced no concerns nor showed any signs of distress during injection

## 2023-06-14 ENCOUNTER — Other Ambulatory Visit: Payer: Self-pay | Admitting: Internal Medicine

## 2023-06-23 ENCOUNTER — Other Ambulatory Visit: Payer: Self-pay

## 2023-06-30 DIAGNOSIS — L578 Other skin changes due to chronic exposure to nonionizing radiation: Secondary | ICD-10-CM | POA: Diagnosis not present

## 2023-06-30 DIAGNOSIS — L821 Other seborrheic keratosis: Secondary | ICD-10-CM | POA: Diagnosis not present

## 2023-06-30 DIAGNOSIS — D2262 Melanocytic nevi of left upper limb, including shoulder: Secondary | ICD-10-CM | POA: Diagnosis not present

## 2023-06-30 DIAGNOSIS — D2261 Melanocytic nevi of right upper limb, including shoulder: Secondary | ICD-10-CM | POA: Diagnosis not present

## 2023-06-30 DIAGNOSIS — D2271 Melanocytic nevi of right lower limb, including hip: Secondary | ICD-10-CM | POA: Diagnosis not present

## 2023-06-30 DIAGNOSIS — D225 Melanocytic nevi of trunk: Secondary | ICD-10-CM | POA: Diagnosis not present

## 2023-06-30 DIAGNOSIS — D2272 Melanocytic nevi of left lower limb, including hip: Secondary | ICD-10-CM | POA: Diagnosis not present

## 2023-07-20 ENCOUNTER — Telehealth: Payer: Self-pay | Admitting: *Deleted

## 2023-07-20 DIAGNOSIS — E7800 Pure hypercholesterolemia, unspecified: Secondary | ICD-10-CM

## 2023-07-20 NOTE — Telephone Encounter (Signed)
 Please place future lab orders for lab appt next week. Thanks

## 2023-07-20 NOTE — Telephone Encounter (Signed)
 Future labs ordered.

## 2023-07-22 ENCOUNTER — Other Ambulatory Visit: Payer: Self-pay | Admitting: Internal Medicine

## 2023-07-23 ENCOUNTER — Telehealth: Payer: Self-pay | Admitting: Internal Medicine

## 2023-07-23 NOTE — Telephone Encounter (Signed)
 Patient is aware can do labs same day

## 2023-07-23 NOTE — Telephone Encounter (Signed)
 Patient is calling to see if she can just get her blood drawn when she comes in to see the dr she stated there will be bad weather on Monday she would like a call back regarding this

## 2023-07-26 ENCOUNTER — Other Ambulatory Visit: Payer: Medicare HMO

## 2023-07-28 ENCOUNTER — Encounter: Payer: Self-pay | Admitting: Internal Medicine

## 2023-07-28 ENCOUNTER — Ambulatory Visit: Payer: Medicare HMO | Admitting: Internal Medicine

## 2023-07-28 VITALS — BP 129/78 | HR 89 | Temp 97.9°F | Resp 16 | Ht <= 58 in | Wt 127.0 lb

## 2023-07-28 DIAGNOSIS — W19XXXS Unspecified fall, sequela: Secondary | ICD-10-CM

## 2023-07-28 DIAGNOSIS — N1831 Chronic kidney disease, stage 3a: Secondary | ICD-10-CM | POA: Diagnosis not present

## 2023-07-28 DIAGNOSIS — I10 Essential (primary) hypertension: Secondary | ICD-10-CM | POA: Diagnosis not present

## 2023-07-28 DIAGNOSIS — E039 Hypothyroidism, unspecified: Secondary | ICD-10-CM

## 2023-07-28 DIAGNOSIS — Z0000 Encounter for general adult medical examination without abnormal findings: Secondary | ICD-10-CM | POA: Diagnosis not present

## 2023-07-28 DIAGNOSIS — W19XXXA Unspecified fall, initial encounter: Secondary | ICD-10-CM | POA: Insufficient documentation

## 2023-07-28 DIAGNOSIS — E7800 Pure hypercholesterolemia, unspecified: Secondary | ICD-10-CM

## 2023-07-28 DIAGNOSIS — Z860100 Personal history of colon polyps, unspecified: Secondary | ICD-10-CM | POA: Diagnosis not present

## 2023-07-28 LAB — LIPID PANEL
Cholesterol: 203 mg/dL — ABNORMAL HIGH (ref 0–200)
HDL: 48.8 mg/dL (ref >39.00)
LDL Cholesterol: 135 mg/dL — ABNORMAL HIGH (ref 0–99)
NonHDL: 153.98
Total CHOL/HDL Ratio: 4
Triglycerides: 95 mg/dL (ref 0.0–149.0)
VLDL: 19 mg/dL (ref 0.0–40.0)

## 2023-07-28 LAB — HEPATIC FUNCTION PANEL
ALT: 17 U/L (ref 0–35)
AST: 22 U/L (ref 0–37)
Albumin: 4.5 g/dL (ref 3.5–5.2)
Alkaline Phosphatase: 69 U/L (ref 39–117)
Bilirubin, Direct: 0.1 mg/dL (ref 0.0–0.3)
Total Bilirubin: 0.5 mg/dL (ref 0.2–1.2)
Total Protein: 7.4 g/dL (ref 6.0–8.3)

## 2023-07-28 LAB — BASIC METABOLIC PANEL
BUN: 22 mg/dL (ref 6–23)
CO2: 28 meq/L (ref 19–32)
Calcium: 9.7 mg/dL (ref 8.4–10.5)
Chloride: 102 meq/L (ref 96–112)
Creatinine, Ser: 1.26 mg/dL — ABNORMAL HIGH (ref 0.40–1.20)
GFR: 41.64 mL/min — ABNORMAL LOW (ref >60.00)
Glucose, Bld: 106 mg/dL — ABNORMAL HIGH (ref 70–99)
Potassium: 4.1 meq/L (ref 3.5–5.1)
Sodium: 138 meq/L (ref 135–145)

## 2023-07-28 LAB — TSH: TSH: 5.69 u[IU]/mL — ABNORMAL HIGH (ref 0.35–5.50)

## 2023-07-28 MED ORDER — LISINOPRIL 10 MG PO TABS: 10.0000 mg | ORAL_TABLET | Freq: Two times a day (BID) | ORAL | 2 refills | Status: DC

## 2023-07-28 MED ORDER — AMLODIPINE BESYLATE 2.5 MG PO TABS: 2.5000 mg | ORAL_TABLET | Freq: Every day | ORAL | 3 refills | Status: AC

## 2023-07-28 NOTE — Progress Notes (Signed)
 Subjective:    Patient ID: Ann Perkins, female    DOB: June 29, 1946, 78 y.o.   MRN: [id number]  Patient here for  Chief Complaint  Patient presents with   Medical Management of Chronic Issues    HPI Here for a physical exam. Saw cardiology for f/u 11/27/21. Previous zio patch - SR - average heart rate 94 with rare ectopy. Had f/u 12/18/22 - stable. Saw GI 04/30/22 - episode of rectal bleeding.  Recommended colonoscopy.  Colonoscopy 05/28/22.  Pt reports four polyps.  Recommended f/u in 4 years. Discussed mammogram and bone density - declines. States she is doing well overall. Breathing stable. No chest pain. Approximately 1-2 weeks ago - had some issues with hemorrhoids. No bleeding. Resolved now. Discussed importance of keeping bowels moving. Discussed taking benefiber daily and staying hydrated. She did fall in 05/2023. Tripped over the corner of a mat. Hit a railing - hit across her chest. No residual pain now. She did catch her toe.  It has been a little bigger and red since the injury. No pain. No evidence of infection.    Past Medical History:  Diagnosis Date   Allergy    GERD (gastroesophageal reflux disease)    Hemorrhoids    History of colon polyps    Hypertension    Thyroid  disease    Past Surgical History:  Procedure Laterality Date   COLONOSCOPY  2016   MOUTH SURGERY     x 2   TONSILLECTOMY     WRIST SURGERY     Family History  Problem Relation Age of Onset   Hyperlipidemia Mother    Heart disease Mother        Pacemaker, heart attack   Diabetes Mother    Stroke Father    Heart disease Father        Angina   Colon cancer Sister        x 2 Half(Father Side)   Colon cancer Brother        Half(Father Side)   Colon cancer Cousin        Mother Side    Esophageal cancer Neg Hx    Rectal cancer Neg Hx    Stomach cancer Neg Hx    Social History   Socioeconomic History   Marital status: Single    Spouse name: Not on file   Number of children: Not on file    Years of education: Not on file   Highest education level: Not on file  Occupational History   Occupation: Retired  Tobacco Use   Smoking status: Never   Smokeless tobacco: Never  Vaping Use   Vaping status: Never Used  Substance and Sexual Activity   Alcohol use: No    Alcohol/week: 0.0 standard drinks of alcohol   Drug use: No   Sexual activity: Not Currently  Other Topics Concern   Not on file  Social History Narrative   Lives alone.  Never married.     Social Drivers of Corporate Investment Banker Strain: Not on file  Food Insecurity: Not on file  Transportation Needs: Not on file  Physical Activity: Not on file  Stress: Not on file  Social Connections: Not on file     Review of Systems  Constitutional:  Negative for appetite change, fever and unexpected weight change.  HENT:  Negative for congestion, sinus pressure and sore throat.   Eyes:  Negative for pain and visual disturbance.  Respiratory:  Negative for cough, chest tightness  and shortness of breath.   Cardiovascular:  Negative for chest pain, palpitations and leg swelling.  Gastrointestinal:  Negative for abdominal pain, diarrhea, nausea and vomiting.  Genitourinary:  Negative for difficulty urinating and dysuria.  Musculoskeletal:  Negative for joint swelling and myalgias.  Skin:  Negative for color change and rash.  Neurological:  Negative for dizziness and headaches.  Hematological:  Negative for adenopathy. Does not bruise/bleed easily.  Psychiatric/Behavioral:  Negative for agitation and dysphoric mood.        Objective:     BP 129/78   Pulse 89   Temp 97.9 F (36.6 C)   Resp 16   Ht 4' 9 (1.448 m)   Wt 127 lb (57.6 kg)   LMP 07/21/1991   SpO2 98%   BMI 27.48 kg/m  Wt Readings from Last 3 Encounters:  07/28/23 127 lb (57.6 kg)  03/17/23 127 lb 12.8 oz (58 kg)  12/18/22 130 lb 12.8 oz (59.3 kg)    Physical Exam Vitals reviewed.  Constitutional:      General: She is not in acute  distress.    Appearance: Normal appearance. She is well-developed.  HENT:     Head: Normocephalic and atraumatic.     Right Ear: External ear normal.     Left Ear: External ear normal.     Mouth/Throat:     Pharynx: No oropharyngeal exudate or posterior oropharyngeal erythema.  Eyes:     General: No scleral icterus.       Right eye: No discharge.        Left eye: No discharge.     Conjunctiva/sclera: Conjunctivae normal.  Neck:     Thyroid : No thyromegaly.  Cardiovascular:     Rate and Rhythm: Normal rate and regular rhythm.  Pulmonary:     Effort: No tachypnea, accessory muscle usage or respiratory distress.     Breath sounds: Normal breath sounds. No decreased breath sounds or wheezing.  Chest:  Breasts:    Right: No inverted nipple, mass, nipple discharge or tenderness (no axillary adenopathy).     Left: No inverted nipple, mass, nipple discharge or tenderness (no axilarry adenopathy).  Abdominal:     General: Bowel sounds are normal.     Palpations: Abdomen is soft.     Tenderness: There is no abdominal tenderness.  Musculoskeletal:        General: No swelling or tenderness.     Cervical back: Neck supple.  Lymphadenopathy:     Cervical: No cervical adenopathy.  Skin:    Findings: No rash.     Comments: Minimal erythema - left great toe. No pain to palpation. No evidence of infection.   Neurological:     Mental Status: She is alert and oriented to person, place, and time.  Psychiatric:        Mood and Affect: Mood normal.        Behavior: Behavior normal.      Outpatient Encounter Medications as of 07/28/2023  Medication Sig   amLODipine  (NORVASC ) 2.5 MG tablet Take 1 tablet (2.5 mg total) by mouth daily.   fluticasone (FLONASE) 50 MCG/ACT nasal spray Place into both nostrils daily as needed.   lisinopril  (ZESTRIL ) 10 MG tablet Take 1 tablet (10 mg total) by mouth 2 (two) times daily.   sodium chloride  (OCEAN) 0.65 % nasal spray Place 1 spray into the nose as  needed for congestion.   SYNTHROID  50 MCG tablet TAKE 1 TABLET BY MOUTH DAILY   SYNTHROID  75 MCG  tablet TAKE 1 TABLET BY MOUTH DAILY   [DISCONTINUED] amLODipine  (NORVASC ) 2.5 MG tablet Take 1 tablet (2.5 mg total) by mouth daily.   [DISCONTINUED] lisinopril  (ZESTRIL ) 10 MG tablet TAKE ONE TABLET BY MOUTH TWICE DAILY, INTHE MORNING AND AT BEDTIME.   No facility-administered encounter medications on file as of 07/28/2023.     Lab Results  Component Value Date   WBC 5.2 12/23/2022   HGB 12.9 12/23/2022   HCT 39.2 12/23/2022   PLT 213.0 12/23/2022   GLUCOSE 108 (H) 02/10/2023   CHOL 188 12/23/2022   TRIG 145.0 12/23/2022   HDL 47.40 12/23/2022   LDLCALC 111 (H) 12/23/2022   ALT 15 12/23/2022   AST 22 12/23/2022   NA 138 02/10/2023   K 4.2 02/10/2023   CL 105 02/10/2023   CREATININE 1.13 02/10/2023   BUN 14 02/10/2023   CO2 24 02/10/2023   TSH 3.52 04/28/2023    CT Head Wo Contrast Result Date: 09/17/2019 CLINICAL DATA:  Poly trauma head and C-spine injury. EXAM: CT HEAD WITHOUT CONTRAST CT CERVICAL SPINE WITHOUT CONTRAST TECHNIQUE: Multidetector CT imaging of the head and cervical spine was performed following the standard protocol without intravenous contrast. Multiplanar CT image reconstructions of the cervical spine were also generated. COMPARISON:  None FINDINGS: CT HEAD FINDINGS Brain: No evidence of acute infarction, hemorrhage, hydrocephalus, extra-axial collection or mass lesion/mass effect. Signs of chronic microvascular ischemic change and atrophy. Vascular: No hyperdense vessel or unexpected calcification. Skull: Normal. Negative for fracture or focal lesion. Sinuses/Orbits: No acute finding. Other: Signs of right frontal hematoma. Small hematoma without underlying fracture. CT CERVICAL SPINE FINDINGS Alignment: Normal. Skull base and vertebrae: No acute fracture. No primary bone lesion or focal pathologic process. Soft tissues and spinal canal: No prevertebral fluid or swelling.  No visible canal hematoma. Disc levels: Multilevel degenerative changes in the spine greatest at C5-6 and C6-7 with mild to moderate anterior osteophytes and uncovertebral degenerative changes. Upper chest: Negative. Other: None IMPRESSION: 1. No CT evidence for acute intracranial process. 2. Signs of chronic microvascular ischemic change and atrophy. 3. Signs of right frontal hematoma without underlying fracture. 4. No fracture or malalignment in the cervical spine. Electronically Signed   By: Isla Blind M.D.   On: 09/17/2019 14:36   CT Cervical Spine Wo Contrast Result Date: 09/17/2019 CLINICAL DATA:  Poly trauma head and C-spine injury. EXAM: CT HEAD WITHOUT CONTRAST CT CERVICAL SPINE WITHOUT CONTRAST TECHNIQUE: Multidetector CT imaging of the head and cervical spine was performed following the standard protocol without intravenous contrast. Multiplanar CT image reconstructions of the cervical spine were also generated. COMPARISON:  None FINDINGS: CT HEAD FINDINGS Brain: No evidence of acute infarction, hemorrhage, hydrocephalus, extra-axial collection or mass lesion/mass effect. Signs of chronic microvascular ischemic change and atrophy. Vascular: No hyperdense vessel or unexpected calcification. Skull: Normal. Negative for fracture or focal lesion. Sinuses/Orbits: No acute finding. Other: Signs of right frontal hematoma. Small hematoma without underlying fracture. CT CERVICAL SPINE FINDINGS Alignment: Normal. Skull base and vertebrae: No acute fracture. No primary bone lesion or focal pathologic process. Soft tissues and spinal canal: No prevertebral fluid or swelling. No visible canal hematoma. Disc levels: Multilevel degenerative changes in the spine greatest at C5-6 and C6-7 with mild to moderate anterior osteophytes and uncovertebral degenerative changes. Upper chest: Negative. Other: None IMPRESSION: 1. No CT evidence for acute intracranial process. 2. Signs of chronic microvascular ischemic change  and atrophy. 3. Signs of right frontal hematoma without underlying fracture. 4. No  fracture or malalignment in the cervical spine. Electronically Signed   By: Isla Blind M.D.   On: 09/17/2019 14:36   DG Wrist Complete Right Result Date: 09/17/2019 CLINICAL DATA:  Status post fall, right wrist pain EXAM: RIGHT WRIST - COMPLETE 3+ VIEW COMPARISON:  None. FINDINGS: Generalized osteopenia. Comminuted and impacted distal radial metaphysis fracture extending to the articular surface of the radiocarpal joint. Nondisplaced fracture of the ulnar styloid process. Mild osteoarthritis of the first [facility] joint, first MCP joint and first IP joint. Soft tissue swelling around the right wrist. IMPRESSION: 1. Comminuted and impacted distal radial metaphysis fracture extending to the articular surface of the radiocarpal joint. 2. Nondisplaced fracture of the ulnar styloid process. Electronically Signed   By: Julaine Blanch   On: 09/17/2019 14:28   DG Ribs Unilateral W/Chest Right Result Date: 09/17/2019 CLINICAL DATA:  Right rib pain. EXAM: RIGHT RIBS AND CHEST - 3+ VIEW COMPARISON:  None. FINDINGS: No fracture or other bone lesions are seen involving the ribs. There is no evidence of pneumothorax or pleural effusion. Both lungs are clear. Heart size and mediastinal contours are within normal limits. IMPRESSION: Negative. Electronically Signed   By: Julaine Blanch   On: 09/17/2019 14:26       Assessment & Plan:  Routine general medical examination at a health care facility  Hypothyroidism, unspecified type Assessment & Plan: On thyroid  replacement.  Check tsh today.    Orders: -     TSH  Health care maintenance Assessment & Plan: Physical today 07/28/23..  Colonoscopy 2019 - Dr Albertus.  F/u colonoscopy 05/28/22 - Dr Pyrtle - colonoscopy: Two 2 to 3 mm polyps in the ascending colon. Two 4 to 5 mm polyps in the ascending colon. Moderate diverticulosis in the sigmoid colon and in the descending colon. Internal  hemorrhoids. Recommend f/u colonoscopy in 3 years.   Declines mammogram. Declines bone density.    Hypercholesterolemia Assessment & Plan: Discussed calculated cholesterol risk.  Have discussed recommendation to start a cholesterol medication.  She has declined.  Low cholesterol diet and exercise.  Follow lipid panel and liver function tests.   The 10-year ASCVD risk score (Arnett DK, et al., 2019) is: 21.2%   Values used to calculate the score:     Age: 55 years     Sex: Female     Is Non-Hispanic African American: No     Diabetic: No     Tobacco smoker: No     Systolic Blood Pressure: 129 mmHg     Is BP treated: Yes     HDL Cholesterol: 47.4 mg/dL     Total Cholesterol: 188 mg/dL   Orders: -     Basic metabolic panel -     Hepatic function panel -     Lipid panel  History of colonic polyps Assessment & Plan: 05/28/22 - Dr Pyrtle - colonoscopy: Two 2 to 3 mm polyps in the ascending colon. Two 4 to 5 mm polyps in the ascending colon. Moderate diverticulosis in the sigmoid colon and in the descending colon. Internal hemorrhoids. Recommend f/u colonoscopy in 3 years.    Essential hypertension, benign Assessment & Plan: Continue lisinopril  and amlodipine .  Reviewed outside blood pressures - most averagoing 114-120s/70.  Hold on making any changes. Continue to follow pressures.  Follow metabolic panel.    Stage 3a chronic kidney disease (HCC) Assessment & Plan: Avoid antiinflammatories.  Stay hydrated.  Follow metabolic panel.  Continue lisinopril .    Fall, sequela Assessment &  Plan: [place] in 05/2023. No residual pain or problems.  Has just noticed left great toe has remained a little more red and a little bigger since the fall. No pain. Discussed further evaluation. Discussed xray. She declines.  Will notify me if any change.    Other orders -     amLODIPine  Besylate; Take 1 tablet (2.5 mg total) by mouth daily.  Dispense: 90 tablet; Refill: 3 -     Lisinopril ; Take 1 tablet  (10 mg total) by mouth 2 (two) times daily.  Dispense: 180 tablet; Refill: 2     Allena Hamilton, MD

## 2023-07-28 NOTE — Assessment & Plan Note (Signed)
 Fell in 05/2023. No residual pain or problems.  Has just noticed left great toe has remained a little more red and a little bigger since the fall. No pain. Discussed further evaluation. Discussed xray. She declines.  Will notify me if any change.

## 2023-07-28 NOTE — Assessment & Plan Note (Signed)
 Continue lisinopril and amlodipine.  Reviewed outside blood pressures - most averagoing 114-120s/70.  Hold on making any changes. Continue to follow pressures.  Follow metabolic panel.

## 2023-07-28 NOTE — Assessment & Plan Note (Addendum)
 Physical today 07/28/23..  Colonoscopy 2019 - Dr Albertus.  F/u colonoscopy 05/28/22 - Dr Pyrtle - colonoscopy: Two 2 to 3 mm polyps in the ascending colon. Two 4 to 5 mm polyps in the ascending colon. Moderate diverticulosis in the sigmoid colon and in the descending colon. Internal hemorrhoids. Recommend f/u colonoscopy in 3 years.   Declines mammogram. Declines bone density.

## 2023-07-28 NOTE — Assessment & Plan Note (Signed)
05/28/22 - Dr Hilarie Fredrickson - colonoscopy: Two 2 to 3 mm polyps in the ascending colon. Two 4 to 5 mm polyps in the ascending colon. Moderate diverticulosis in the sigmoid colon and in the descending colon. Internal hemorrhoids. Recommend f/u colonoscopy in 3 years.

## 2023-07-28 NOTE — Assessment & Plan Note (Signed)
On thyroid replacement.  Check tsh today.

## 2023-07-28 NOTE — Patient Instructions (Signed)
Benefiber - daily 

## 2023-07-28 NOTE — Assessment & Plan Note (Signed)
 Discussed calculated cholesterol risk.  Have discussed recommendation to start a cholesterol medication.  She has declined.  Low cholesterol diet and exercise.  Follow lipid panel and liver function tests.   The 10-year ASCVD risk score (Arnett DK, et al., 2019) is: 21.2%   Values used to calculate the score:     Age: 78 years     Sex: Female     Is Non-Hispanic African American: No     Diabetic: No     Tobacco smoker: No     Systolic Blood Pressure: 129 mmHg     Is BP treated: Yes     HDL Cholesterol: 47.4 mg/dL     Total Cholesterol: 188 mg/dL

## 2023-07-28 NOTE — Assessment & Plan Note (Signed)
Avoid antiinflammatories.  Stay hydrated.  Follow metabolic panel.  Continue lisinopril.

## 2023-07-30 ENCOUNTER — Other Ambulatory Visit: Payer: Self-pay

## 2023-07-30 MED ORDER — SYNTHROID 75 MCG PO TABS: 75.0000 ug | ORAL_TABLET | Freq: Every day | ORAL | 1 refills | Status: DC

## 2023-08-02 NOTE — Telephone Encounter (Signed)
 Ok to take synthroid for two days alternating wth for 1 day.  Recheck tsh in 6 weeks. If any worsening symptoms or persistent symptoms, will need to be evaluated.

## 2023-08-02 NOTE — Telephone Encounter (Signed)
 Patient aware of below.

## 2023-08-02 NOTE — Telephone Encounter (Signed)
 Patient called because she does not feel comfortable taking levothyroxine  75 mcg q day and would like to take levothyroxine  75 mcg for 2 days and then a 50 mcg and alternate that way instead of increasing to 75 mcg q day. Also wanted to let you know that her dentist gave her a prescription for amoxicillin  500 mg bid. This was given to her for a tooth but she started having sinus pressure, congestion, earache and mild cough last thurs-fri so she started the abx yesterday. Took a covid test and was negative. Confirmed ok, no sob, chest tightness, fever, etc.

## 2023-08-02 NOTE — Telephone Encounter (Unsigned)
 Copied from CRM [id number]. Topic: Clinical - Medication Question >> Aug 02, 2023  9:18 AM Franky GRADE wrote: Reason for CRM: Patient's SYNTHROID  75 MCG tablet dosage was changed and she has concerns regarding the new dosage and would like to speak with Dr.Scott if possible. Patient also would like to let the office know she is taking amoxicillin  for a sinus infection.

## 2023-08-05 ENCOUNTER — Encounter: Payer: Self-pay | Admitting: Nurse Practitioner

## 2023-08-05 ENCOUNTER — Ambulatory Visit: Payer: Medicare HMO | Admitting: Nurse Practitioner

## 2023-08-05 VITALS — BP 130/70 | HR 117 | Temp 98.8°F | Ht <= 58 in | Wt 126.2 lb

## 2023-08-05 DIAGNOSIS — J069 Acute upper respiratory infection, unspecified: Secondary | ICD-10-CM

## 2023-08-05 DIAGNOSIS — Z1152 Encounter for screening for COVID-19: Secondary | ICD-10-CM

## 2023-08-05 LAB — POC COVID19 BINAXNOW: SARS Coronavirus 2 Ag: NEGATIVE

## 2023-08-05 MED ORDER — PREDNISONE 10 MG PO TABS: ORAL_TABLET | ORAL | 0 refills | Status: DC

## 2023-08-05 NOTE — Patient Instructions (Addendum)
It was nice to meet you.   I am sending Prednisone to your pharmacy for you to pick up if your symptoms do not begin to improve. You are to continue your Amoxicillin 500 mg twice daily for a total of 10 days.   I recommend trying an over the counter decongestant to help with your symptoms such as Mucinex, taken twice daily.   Make sure you are staying adequately hydrated. You can continue your saline nasal spray and Flonase daily.

## 2023-08-05 NOTE — Progress Notes (Signed)
Bethanie Dicker, NP-C Phone: [phone]  Ann Perkins is a 78 y.o. female who presents today for congestion.   Discussed the use of AI scribe software for clinical note transcription with the patient, who gave verbal consent to proceed.  History of Present Illness   The patient presents with a chief complaint of right ear discomfort and nasal congestion. They report the onset of symptoms began last Thursday, initially with sneezing and a runny nose, which progressed to increased congestion. The patient describes a sensation of fluid running down from the right ear to the throat. They also report a cough, which was initially productive with a small amount of expectoration, but has since improved. The patient notes that the nasal discharge is clear and thick, and they have been blowing their nose frequently.  The patient has been self-managing symptoms with saline nasal spray and Flonase, and started taking amoxicillin, which was initially prescribed by a dentist for a dental issue. The patient has been taking the amoxicillin twice daily since Sunday. They also report using cough drops for symptomatic relief.  The patient denies any wheezing, shortness of breath, fever, fatigue, body aches, or headaches. They report a sensation of pressure in the nose and a dry mouth, which they attribute to mouth breathing due to nasal congestion. They also report a loss of taste and smell, which they believe is due to the nasal congestion. The patient has tested negative for COVID-19 twice, once on Sunday and again on the day of the consultation.      Social History   Tobacco Use  Smoking Status Never  Smokeless Tobacco Never    Current Outpatient Medications on File Prior to Visit  Medication Sig Dispense Refill   amLODipine (NORVASC) 2.5 MG tablet Take 1 tablet (2.5 mg total) by mouth daily. 90 tablet 3   fluticasone (FLONASE) 50 MCG/ACT nasal spray Place into both nostrils daily as needed.     lisinopril  (ZESTRIL) 10 MG tablet Take 1 tablet (10 mg total) by mouth 2 (two) times daily. 180 tablet 2   sodium chloride (OCEAN) 0.65 % nasal spray Place 1 spray into the nose as needed for congestion.     SYNTHROID 75 MCG tablet Take 1 tablet (75 mcg total) by mouth daily. 90 tablet 1   No current facility-administered medications on file prior to visit.    ROS see history of present illness  Objective  Physical Exam Vitals:   08/05/23 1601  BP: 130/70  Pulse: (!) 117  Temp: 98.8 F (37.1 C)  SpO2: 96%    BP Readings from Last 3 Encounters:  08/05/23 130/70  07/28/23 129/78  03/17/23 122/72   Wt Readings from Last 3 Encounters:  08/05/23 126 lb 3.2 oz (57.2 kg)  07/28/23 127 lb (57.6 kg)  03/17/23 127 lb 12.8 oz (58 kg)    Physical Exam Constitutional:      General: She is not in acute distress.    Appearance: Normal appearance.  HENT:     Head: Normocephalic.     Right Ear: Tympanic membrane normal.     Left Ear: Tympanic membrane normal.     Nose: Congestion present.     Mouth/Throat:     Mouth: Mucous membranes are moist.     Pharynx: Oropharynx is clear.  Eyes:     Conjunctiva/sclera: Conjunctivae normal.     Pupils: Pupils are equal, round, and reactive to light.  Cardiovascular:     Rate and Rhythm: Normal rate and regular  rhythm.     Heart sounds: Normal heart sounds.  Pulmonary:     Effort: Pulmonary effort is normal.     Breath sounds: Normal breath sounds.  Lymphadenopathy:     Cervical: No cervical adenopathy.  Skin:    General: Skin is warm and dry.  Neurological:     General: No focal deficit present.     Mental Status: She is alert.  Psychiatric:        Mood and Affect: Mood normal.        Behavior: Behavior normal.    Assessment/Plan: Please see individual problem list.  Upper respiratory tract infection, unspecified type Assessment & Plan: Symptoms began last Thursday with sneezing, nasal congestion, and cough. Nasal congestion and  right-sided ear discomfort persist, but there is no fever, shortness of breath, or productive cough. Amoxicillin 500mg  has been taken twice daily since Sunday, along with saline nasal spray and Flonase. Continue Amoxicillin for a total of 10 days, and continue using saline nasal spray and Flonase. Start Mucinex as directed. If symptoms do not improve over the weekend, begin Prednisone as prescribed. Encouraged adequate hydration. Return for evaluation if symptoms persist or worsen.   Orders: -     predniSONE; TAKE 3 TABLETS PO QD FOR 3 DAYS THEN TAKE 2 TABLETS PO QD FOR 3 DAYS THEN TAKE 1 TABLET PO QD FOR 3 DAYS THEN TAKE 1/2 TAB PO QD FOR 3 DAYS  Dispense: 20 tablet; Refill: 0  Encounter for screening for COVID-19 -     POC COVID-19 BinaxNow    Return if symptoms worsen or fail to improve.   Bethanie Dicker, NP-C [facility]

## 2023-08-05 NOTE — Assessment & Plan Note (Addendum)
Symptoms began last Thursday with sneezing, nasal congestion, and cough. Nasal congestion and right-sided ear discomfort persist, but there is no fever, shortness of breath, or productive cough. Amoxicillin 500mg  has been taken twice daily since Sunday, along with saline nasal spray and Flonase. Continue Amoxicillin for a total of 10 days, and continue using saline nasal spray and Flonase. Start Mucinex as directed. If symptoms do not improve over the weekend, begin Prednisone as prescribed. Encouraged adequate hydration. Return for evaluation if symptoms persist or worsen.

## 2023-08-11 ENCOUNTER — Telehealth: Payer: Self-pay | Admitting: Internal Medicine

## 2023-08-11 NOTE — Telephone Encounter (Signed)
Copied from CRM [id number]. Topic: General - Other >> Aug 11, 2023  8:59 AM Gurney Maxin H wrote: Reason for CRM: Patient called stating she previously spoke with Rosann Auerbach regarding having a copy of her blood work results from January 8th be mailed to her address on file.

## 2023-08-11 NOTE — Telephone Encounter (Signed)
LM letting patient know.  °

## 2023-08-11 NOTE — Telephone Encounter (Signed)
Remailed labs

## 2023-08-30 ENCOUNTER — Telehealth: Payer: Self-pay | Admitting: Internal Medicine

## 2023-08-30 DIAGNOSIS — E7800 Pure hypercholesterolemia, unspecified: Secondary | ICD-10-CM

## 2023-08-30 DIAGNOSIS — I10 Essential (primary) hypertension: Secondary | ICD-10-CM

## 2023-08-30 DIAGNOSIS — E039 Hypothyroidism, unspecified: Secondary | ICD-10-CM

## 2023-08-30 NOTE — Telephone Encounter (Signed)
 Patient need lab orders.

## 2023-08-30 NOTE — Telephone Encounter (Signed)
 Orders placed for labs

## 2023-09-08 ENCOUNTER — Other Ambulatory Visit: Payer: Medicare HMO

## 2023-09-15 ENCOUNTER — Other Ambulatory Visit: Payer: Medicare HMO

## 2023-09-15 ENCOUNTER — Telehealth: Payer: Self-pay | Admitting: Internal Medicine

## 2023-09-15 NOTE — Telephone Encounter (Signed)
 Left message to reschedule lab / lab is closed today

## 2023-09-20 ENCOUNTER — Other Ambulatory Visit (INDEPENDENT_AMBULATORY_CARE_PROVIDER_SITE_OTHER): Payer: Medicare HMO

## 2023-09-20 DIAGNOSIS — E7800 Pure hypercholesterolemia, unspecified: Secondary | ICD-10-CM | POA: Diagnosis not present

## 2023-09-20 DIAGNOSIS — I10 Essential (primary) hypertension: Secondary | ICD-10-CM

## 2023-09-20 DIAGNOSIS — E039 Hypothyroidism, unspecified: Secondary | ICD-10-CM

## 2023-09-20 LAB — BASIC METABOLIC PANEL
BUN: 18 mg/dL (ref 6–23)
CO2: 25 meq/L (ref 19–32)
Calcium: 9.6 mg/dL (ref 8.4–10.5)
Chloride: 106 meq/L (ref 96–112)
Creatinine, Ser: 1.17 mg/dL (ref 0.40–1.20)
GFR: 45.46 mL/min — ABNORMAL LOW (ref >60.00)
Glucose, Bld: 99 mg/dL (ref 70–99)
Potassium: 4.4 meq/L (ref 3.5–5.1)
Sodium: 141 meq/L (ref 135–145)

## 2023-09-20 LAB — LIPID PANEL
Cholesterol: 178 mg/dL (ref 0–200)
HDL: 46.8 mg/dL (ref >39.00)
LDL Cholesterol: 110 mg/dL — ABNORMAL HIGH (ref 0–99)
NonHDL: 130.83
Total CHOL/HDL Ratio: 4
Triglycerides: 106 mg/dL (ref 0.0–149.0)
VLDL: 21.2 mg/dL (ref 0.0–40.0)

## 2023-09-20 LAB — HEPATIC FUNCTION PANEL
ALT: 12 U/L (ref 0–35)
AST: 17 U/L (ref 0–37)
Albumin: 4.1 g/dL (ref 3.5–5.2)
Alkaline Phosphatase: 52 U/L (ref 39–117)
Bilirubin, Direct: 0.1 mg/dL (ref 0.0–0.3)
Total Bilirubin: 0.5 mg/dL (ref 0.2–1.2)
Total Protein: 7.2 g/dL (ref 6.0–8.3)

## 2023-09-20 LAB — TSH: TSH: 1.31 u[IU]/mL (ref 0.35–5.50)

## 2023-09-22 ENCOUNTER — Ambulatory Visit: Payer: Self-pay | Admitting: Internal Medicine

## 2023-09-22 NOTE — Telephone Encounter (Signed)
 See result note.

## 2023-09-22 NOTE — Telephone Encounter (Signed)
  Chief Complaint: lab results  Disposition: [] ED /[] Urgent Care (no appt availability in office) / [] Appointment(In office/virtual)/ []  [facility]/ [] Home Care/ [] Refused Recommended Disposition /[] [facility]/ []  Follow-up with PCP  Additional Notes: Patient concerned, she states she was only told by PCP she was having her thyroid level checked. She states she was not aware she was having her cholesterol, liver, kidney functions tested. Patient asking for printed results of her labs. Patient asked RN to relay the exact numbers for TSH, cholesterol, liver and kidney function. Patient verbalizes understanding.  Reason for Disposition  Caller requesting lab results  (Exception: Routine or non-urgent lab result.)  Answer Assessment - Initial Assessment Questions 1. REASON FOR CALL or QUESTION: "What is your reason for calling today?" or "How can I best help you?" or "What question do you have that I can help answer?"     Patient concerned, she states she was only told by PCP she was having her thyroid level checked. She states she was not aware she was having her cholesterol, liver, kidney functions tested. Patient asking for printed results of her labs. Patient asked RN to relay the exact numbers for TSH, cholesterol, liver and kidney function. Patient verbalizes understanding.  Protocols used: Information Only Call - No Triage-A-AH, PCP Call - No Triage-A-AH

## 2023-09-22 NOTE — Telephone Encounter (Signed)
 Copied from CRM [id number]. Topic: Clinical - Lab/Test Results >> Sep 22, 2023  8:30 AM Gurney Maxin H wrote: Reason for CRM: Patient called to obtain lab results, again gave patient message from provider as stated, patient acknowledged. Patient wanted further explanation and agent transferred patient to triage line for assistance.

## 2023-10-05 ENCOUNTER — Other Ambulatory Visit: Payer: Self-pay | Admitting: Internal Medicine

## 2023-10-05 NOTE — Telephone Encounter (Signed)
 Please refuse. Patient is on  q day

## 2023-10-06 ENCOUNTER — Other Ambulatory Visit: Payer: Self-pay | Admitting: Internal Medicine

## 2023-11-24 DIAGNOSIS — H9313 Tinnitus, bilateral: Secondary | ICD-10-CM | POA: Diagnosis not present

## 2023-11-24 DIAGNOSIS — H6981 Other specified disorders of Eustachian tube, right ear: Secondary | ICD-10-CM | POA: Diagnosis not present

## 2023-12-01 ENCOUNTER — Encounter: Payer: Self-pay | Admitting: Internal Medicine

## 2023-12-01 ENCOUNTER — Ambulatory Visit: Payer: Self-pay | Admitting: Internal Medicine

## 2023-12-01 ENCOUNTER — Ambulatory Visit (INDEPENDENT_AMBULATORY_CARE_PROVIDER_SITE_OTHER): Payer: Medicare HMO | Admitting: Internal Medicine

## 2023-12-01 VITALS — BP 124/72 | HR 88 | Temp 98.3°F | Resp 16 | Ht <= 58 in | Wt 127.8 lb

## 2023-12-01 DIAGNOSIS — Z9109 Other allergy status, other than to drugs and biological substances: Secondary | ICD-10-CM

## 2023-12-01 DIAGNOSIS — N1831 Chronic kidney disease, stage 3a: Secondary | ICD-10-CM

## 2023-12-01 DIAGNOSIS — I10 Essential (primary) hypertension: Secondary | ICD-10-CM

## 2023-12-01 DIAGNOSIS — E7800 Pure hypercholesterolemia, unspecified: Secondary | ICD-10-CM

## 2023-12-01 DIAGNOSIS — E039 Hypothyroidism, unspecified: Secondary | ICD-10-CM

## 2023-12-01 LAB — CBC WITH DIFFERENTIAL/PLATELET
Basophils Absolute: 0 10*3/uL (ref 0.0–0.1)
Basophils Relative: 0.4 % (ref 0.0–3.0)
Eosinophils Absolute: 0.1 10*3/uL (ref 0.0–0.7)
Eosinophils Relative: 2 % (ref 0.0–5.0)
HCT: 40.9 % (ref 36.0–46.0)
Hemoglobin: 13.7 g/dL (ref 12.0–15.0)
Lymphocytes Relative: 27.8 % (ref 12.0–46.0)
Lymphs Abs: 1.7 10*3/uL (ref 0.7–4.0)
MCHC: 33.5 g/dL (ref 30.0–36.0)
MCV: 91.8 fl (ref 78.0–100.0)
Monocytes Absolute: 0.4 10*3/uL (ref 0.1–1.0)
Monocytes Relative: 6.8 % (ref 3.0–12.0)
Neutro Abs: 3.8 10*3/uL (ref 1.4–7.7)
Neutrophils Relative %: 63 % (ref 43.0–77.0)
Platelets: 208 10*3/uL (ref 150.0–400.0)
RBC: 4.46 Mil/uL (ref 3.87–5.11)
RDW: 13.3 % (ref 11.5–15.5)
WBC: 6.1 10*3/uL (ref 4.0–10.5)

## 2023-12-01 LAB — BASIC METABOLIC PANEL WITH GFR
BUN: 14 mg/dL (ref 6–23)
CO2: 28 meq/L (ref 19–32)
Calcium: 9.8 mg/dL (ref 8.4–10.5)
Chloride: 106 meq/L (ref 96–112)
Creatinine, Ser: 1.19 mg/dL (ref 0.40–1.20)
GFR: 44.48 mL/min — ABNORMAL LOW (ref >60.00)
Glucose, Bld: 102 mg/dL — ABNORMAL HIGH (ref 70–99)
Potassium: 4.4 meq/L (ref 3.5–5.1)
Sodium: 141 meq/L (ref 135–145)

## 2023-12-01 LAB — TSH: TSH: 1.16 u[IU]/mL (ref 0.35–5.50)

## 2023-12-01 LAB — LIPID PANEL
Cholesterol: 182 mg/dL (ref 0–200)
HDL: 45.6 mg/dL (ref >39.00)
LDL Cholesterol: 115 mg/dL — ABNORMAL HIGH (ref 0–99)
NonHDL: 136.24
Total CHOL/HDL Ratio: 4
Triglycerides: 105 mg/dL (ref 0.0–149.0)
VLDL: 21 mg/dL (ref 0.0–40.0)

## 2023-12-01 LAB — HEPATIC FUNCTION PANEL
ALT: 14 U/L (ref 0–35)
AST: 21 U/L (ref 0–37)
Albumin: 4.3 g/dL (ref 3.5–5.2)
Alkaline Phosphatase: 58 U/L (ref 39–117)
Bilirubin, Direct: 0.1 mg/dL (ref 0.0–0.3)
Total Bilirubin: 0.5 mg/dL (ref 0.2–1.2)
Total Protein: 7.3 g/dL (ref 6.0–8.3)

## 2023-12-01 MED ORDER — LEVOTHYROXINE SODIUM 50 MCG PO TABS
50.0000 ug | ORAL_TABLET | Freq: Every day | ORAL | 1 refills | Status: AC
Start: 2023-12-01 — End: ?

## 2023-12-01 MED ORDER — LISINOPRIL 10 MG PO TABS: 10.0000 mg | ORAL_TABLET | Freq: Two times a day (BID) | ORAL | 2 refills | Status: AC

## 2023-12-01 NOTE — Assessment & Plan Note (Signed)
 Avoid antiinflammatories.  Stay hydrated.  Continue lisinopril . Check metabolic panel today.

## 2023-12-01 NOTE — Assessment & Plan Note (Signed)
 Uses flonase. Controls.

## 2023-12-01 NOTE — Progress Notes (Signed)
 Subjective:    Patient ID: Ann Perkins, female    DOB: 10-12-1945, 78 y.o.   MRN: [id number]  Patient here for  Chief Complaint  Patient presents with   Medical Management of Chronic Issues    HPI Here for a scheduled follow up - follow up regarding hypercholesterolemia, hypertension, CKD and hypothyroidism. She reports she is doing well. Tries to stay active. Able to do ADLs without problems. No chest pain or sob reported. No abdominal pain or bowel change reported. Uses flonase for allery symptoms.  Controls.    Past Medical History:  Diagnosis Date   Allergy    GERD (gastroesophageal reflux disease)    Hemorrhoids    History of colon polyps    Hypertension    Thyroid  disease    Past Surgical History:  Procedure Laterality Date   COLONOSCOPY  2016   MOUTH SURGERY     x 2   TONSILLECTOMY     WRIST SURGERY     Family History  Problem Relation Age of Onset   Hyperlipidemia Mother    Heart disease Mother        Pacemaker, heart attack   Diabetes Mother    Stroke Father    Heart disease Father        Angina   Colon cancer Sister        x 2 Half(Father Side)   Colon cancer Brother        Half(Father Side)   Colon cancer Cousin        Mother Side    Esophageal cancer Neg Hx    Rectal cancer Neg Hx    Stomach cancer Neg Hx    Social History   Socioeconomic History   Marital status: Single    Spouse name: Not on file   Number of children: Not on file   Years of education: Not on file   Highest education level: Not on file  Occupational History   Occupation: Retired  Tobacco Use   Smoking status: Never   Smokeless tobacco: Never  Vaping Use   Vaping status: Never Used  Substance and Sexual Activity   Alcohol use: No    Alcohol/week: 0.0 standard drinks of alcohol   Drug use: No   Sexual activity: Not Currently  Other Topics Concern   Not on file  Social History Narrative   Lives alone.  Never married.     Social Drivers of Research scientist (physical sciences) Strain: Not on file  Food Insecurity: Not on file  Transportation Needs: Not on file  Physical Activity: Not on file  Stress: Not on file  Social Connections: Not on file     Review of Systems  Constitutional:  Negative for appetite change and unexpected weight change.  HENT:  Negative for congestion and sinus pressure.   Respiratory:  Negative for cough, chest tightness and shortness of breath.   Cardiovascular:  Negative for chest pain, palpitations and leg swelling.  Gastrointestinal:  Negative for abdominal pain, diarrhea, nausea and vomiting.  Genitourinary:  Negative for difficulty urinating and dysuria.  Musculoskeletal:  Negative for joint swelling and myalgias.  Skin:  Negative for color change and rash.  Neurological:  Negative for dizziness and headaches.  Psychiatric/Behavioral:  Negative for agitation and dysphoric mood.        Objective:     BP 124/72   Pulse 88   Temp 98.3 F (36.8 C)   Resp 16   Ht 4\' 9"  (1.448  m)   Wt 127 lb 12.8 oz (58 kg)   LMP 07/21/1991   SpO2 98%   BMI 27.66 kg/m  Wt Readings from Last 3 Encounters:  12/01/23 127 lb 12.8 oz (58 kg)  08/05/23 126 lb 3.2 oz (57.2 kg)  07/28/23 127 lb (57.6 kg)    Physical Exam Vitals reviewed.  Constitutional:      General: She is not in acute distress.    Appearance: Normal appearance.  HENT:     Head: Normocephalic and atraumatic.     Right Ear: External ear normal.     Left Ear: External ear normal.     Mouth/Throat:     Pharynx: No oropharyngeal exudate or posterior oropharyngeal erythema.  Eyes:     General: No scleral icterus.       Right eye: No discharge.        Left eye: No discharge.     Conjunctiva/sclera: Conjunctivae normal.  Neck:     Thyroid : No thyromegaly.  Cardiovascular:     Rate and Rhythm: Normal rate and regular rhythm.  Pulmonary:     Effort: No respiratory distress.     Breath sounds: Normal breath sounds. No wheezing.  Abdominal:     General:  Bowel sounds are normal.     Palpations: Abdomen is soft.     Tenderness: There is no abdominal tenderness.  Musculoskeletal:        General: No swelling or tenderness.     Cervical back: Neck supple. No tenderness.  Lymphadenopathy:     Cervical: No cervical adenopathy.  Skin:    Findings: No erythema or rash.  Neurological:     Mental Status: She is alert.  Psychiatric:        Mood and Affect: Mood normal.        Behavior: Behavior normal.         Outpatient Encounter Medications as of 12/01/2023  Medication Sig   amLODipine  (NORVASC ) 2.5 MG tablet Take 1 tablet (2.5 mg total) by mouth daily.   fluticasone (FLONASE) 50 MCG/ACT nasal spray Place into both nostrils daily as needed.   levothyroxine  (SYNTHROID ) 50 MCG tablet Take 1 tablet (50 mcg total) by mouth daily.   lisinopril  (ZESTRIL ) 10 MG tablet Take 1 tablet (10 mg total) by mouth 2 (two) times daily.   sodium chloride  (OCEAN) 0.65 % nasal spray Place 1 spray into the nose as needed for congestion.   SYNTHROID  75 MCG tablet Take 1 tablet (75 mcg total) by mouth daily.   [DISCONTINUED] levothyroxine  (SYNTHROID ) 50 MCG tablet TAKE 1 TABLET BY MOUTH DAILY   [DISCONTINUED] lisinopril  (ZESTRIL ) 10 MG tablet Take 1 tablet (10 mg total) by mouth 2 (two) times daily.   [DISCONTINUED] predniSONE  (DELTASONE ) 10 MG tablet TAKE 3 TABLETS PO QD FOR 3 DAYS THEN TAKE 2 TABLETS PO QD FOR 3 DAYS THEN TAKE 1 TABLET PO QD FOR 3 DAYS THEN TAKE 1/2 TAB PO QD FOR 3 DAYS   No facility-administered encounter medications on file as of 12/01/2023.     Lab Results  Component Value Date   WBC 5.2 12/23/2022   HGB 12.9 12/23/2022   HCT 39.2 12/23/2022   PLT 213.0 12/23/2022   GLUCOSE 99 09/20/2023   CHOL 178 09/20/2023   TRIG 106.0 09/20/2023   HDL 46.80 09/20/2023   LDLCALC 110 (H) 09/20/2023   ALT 12 09/20/2023   AST 17 09/20/2023   NA 141 09/20/2023   K 4.4 09/20/2023   CL 106  09/20/2023   CREATININE 1.17 09/20/2023   BUN 18  09/20/2023   CO2 25 09/20/2023   TSH 1.31 09/20/2023    CT Head Wo Contrast Result Date: 09/17/2019 CLINICAL DATA:  Poly trauma head and C-spine injury. EXAM: CT HEAD WITHOUT CONTRAST CT CERVICAL SPINE WITHOUT CONTRAST TECHNIQUE: Multidetector CT imaging of the head and cervical spine was performed following the standard protocol without intravenous contrast. Multiplanar CT image reconstructions of the cervical spine were also generated. COMPARISON:  None FINDINGS: CT HEAD FINDINGS Brain: No evidence of acute infarction, hemorrhage, hydrocephalus, extra-axial collection or mass lesion/mass effect. Signs of chronic microvascular ischemic change and atrophy. Vascular: No hyperdense vessel or unexpected calcification. Skull: Normal. Negative for fracture or focal lesion. Sinuses/Orbits: No acute finding. Other: Signs of right frontal hematoma. Small hematoma without underlying fracture. CT CERVICAL SPINE FINDINGS Alignment: Normal. Skull base and vertebrae: No acute fracture. No primary bone lesion or focal pathologic process. Soft tissues and spinal canal: No prevertebral fluid or swelling. No visible canal hematoma. Disc levels: Multilevel degenerative changes in the spine greatest at C5-6 and C6-7 with mild to moderate anterior osteophytes and uncovertebral degenerative changes. Upper chest: Negative. Other: None IMPRESSION: 1. No CT evidence for acute intracranial process. 2. Signs of chronic microvascular ischemic change and atrophy. 3. Signs of right frontal hematoma without underlying fracture. 4. No fracture or malalignment in the cervical spine. Electronically Signed   By: Zara Heymann M.D.   On: 09/17/2019 14:36   CT Cervical Spine Wo Contrast Result Date: 09/17/2019 CLINICAL DATA:  Poly trauma head and C-spine injury. EXAM: CT HEAD WITHOUT CONTRAST CT CERVICAL SPINE WITHOUT CONTRAST TECHNIQUE: Multidetector CT imaging of the head and cervical spine was performed following the standard protocol  without intravenous contrast. Multiplanar CT image reconstructions of the cervical spine were also generated. COMPARISON:  None FINDINGS: CT HEAD FINDINGS Brain: No evidence of acute infarction, hemorrhage, hydrocephalus, extra-axial collection or mass lesion/mass effect. Signs of chronic microvascular ischemic change and atrophy. Vascular: No hyperdense vessel or unexpected calcification. Skull: Normal. Negative for fracture or focal lesion. Sinuses/Orbits: No acute finding. Other: Signs of right frontal hematoma. Small hematoma without underlying fracture. CT CERVICAL SPINE FINDINGS Alignment: Normal. Skull base and vertebrae: No acute fracture. No primary bone lesion or focal pathologic process. Soft tissues and spinal canal: No prevertebral fluid or swelling. No visible canal hematoma. Disc levels: Multilevel degenerative changes in the spine greatest at C5-6 and C6-7 with mild to moderate anterior osteophytes and uncovertebral degenerative changes. Upper chest: Negative. Other: None IMPRESSION: 1. No CT evidence for acute intracranial process. 2. Signs of chronic microvascular ischemic change and atrophy. 3. Signs of right frontal hematoma without underlying fracture. 4. No fracture or malalignment in the cervical spine. Electronically Signed   By: Zara Heymann M.D.   On: 09/17/2019 14:36   DG Wrist Complete Right Result Date: 09/17/2019 CLINICAL DATA:  Status post fall, right wrist pain EXAM: RIGHT WRIST - COMPLETE 3+ VIEW COMPARISON:  None. FINDINGS: Generalized osteopenia. Comminuted and impacted distal radial metaphysis fracture extending to the articular surface of the radiocarpal joint. Nondisplaced fracture of the ulnar styloid process. Mild osteoarthritis of the first [facility] joint, first MCP joint and first IP joint. Soft tissue swelling around the right wrist. IMPRESSION: 1. Comminuted and impacted distal radial metaphysis fracture extending to the articular surface of the radiocarpal joint. 2.  Nondisplaced fracture of the ulnar styloid process. Electronically Signed   By: Onnie Bilis   On: 09/17/2019 14:28  DG Ribs Unilateral W/Chest Right Result Date: 09/17/2019 CLINICAL DATA:  Right rib pain. EXAM: RIGHT RIBS AND CHEST - 3+ VIEW COMPARISON:  None. FINDINGS: No fracture or other bone lesions are seen involving the ribs. There is no evidence of pneumothorax or pleural effusion. Both lungs are clear. Heart size and mediastinal contours are within normal limits. IMPRESSION: Negative. Electronically Signed   By: Onnie Bilis   On: 09/17/2019 14:26       Assessment & Plan:  Hypothyroidism, unspecified type Assessment & Plan: On thyroid  replacement. Follow tsh.   Orders: -     TSH  Hypercholesterolemia Assessment & Plan: Have discussed calculated cholesterol risk.  Have discussed recommendation to start a cholesterol medication.  She has declined.  Low cholesterol diet and exercise.  Check lipid panel and liver function today.  The 10-year ASCVD risk score (Arnett DK, et al., 2019) is: 21.7%   Values used to calculate the score:     Age: 71 years     Sex: Female     Is Non-Hispanic African American: No     Diabetic: No     Tobacco smoker: No     Systolic Blood Pressure: 124 mmHg     Is BP treated: Yes     HDL Cholesterol: 46.8 mg/dL     Total Cholesterol: 178 mg/dL   Orders: -     Lipid panel -     Hepatic function panel  Essential hypertension, benign Assessment & Plan: Continue lisinopril  and amlodipine .  Reviewed outside blood pressures - 104-127/60-70s.  Hold on making any changes. Continue to follow pressures.  Check metabolic panel today.   Orders: -     Basic metabolic panel with GFR -     CBC with Differential/Platelet  Environmental allergies Assessment & Plan: Uses flonase. Controls.    Stage 3a chronic kidney disease (HCC) Assessment & Plan: Avoid antiinflammatories.  Stay hydrated.  Continue lisinopril . Check metabolic panel today.    Other  orders -     Levothyroxine  Sodium; Take 1 tablet (50 mcg total) by mouth daily.  Dispense: 90 tablet; Refill: 1 -     Lisinopril ; Take 1 tablet (10 mg total) by mouth 2 (two) times daily.  Dispense: 180 tablet; Refill: 2     Dellar Fenton, MD

## 2023-12-01 NOTE — Assessment & Plan Note (Signed)
 On thyroid replacement.  Follow tsh.

## 2023-12-01 NOTE — Assessment & Plan Note (Signed)
 Have discussed calculated cholesterol risk.  Have discussed recommendation to start a cholesterol medication.  She has declined.  Low cholesterol diet and exercise.  Check lipid panel and liver function today.  The 10-year ASCVD risk score (Arnett DK, et al., 2019) is: 21.7%   Values used to calculate the score:     Age: 78 years     Sex: Female     Is Non-Hispanic African American: No     Diabetic: No     Tobacco smoker: No     Systolic Blood Pressure: 124 mmHg     Is BP treated: Yes     HDL Cholesterol: 46.8 mg/dL     Total Cholesterol: 178 mg/dL

## 2023-12-01 NOTE — Assessment & Plan Note (Signed)
 Continue lisinopril  and amlodipine .  Reviewed outside blood pressures - 104-127/60-70s.  Hold on making any changes. Continue to follow pressures.  Check metabolic panel today.

## 2023-12-07 DIAGNOSIS — H25043 Posterior subcapsular polar age-related cataract, bilateral: Secondary | ICD-10-CM | POA: Diagnosis not present

## 2023-12-07 DIAGNOSIS — H5213 Myopia, bilateral: Secondary | ICD-10-CM | POA: Diagnosis not present

## 2023-12-07 DIAGNOSIS — H2513 Age-related nuclear cataract, bilateral: Secondary | ICD-10-CM | POA: Diagnosis not present

## 2023-12-07 DIAGNOSIS — H524 Presbyopia: Secondary | ICD-10-CM | POA: Diagnosis not present

## 2023-12-07 DIAGNOSIS — H25013 Cortical age-related cataract, bilateral: Secondary | ICD-10-CM | POA: Diagnosis not present

## 2023-12-07 DIAGNOSIS — H52223 Regular astigmatism, bilateral: Secondary | ICD-10-CM | POA: Diagnosis not present

## 2023-12-09 ENCOUNTER — Telehealth: Payer: Self-pay

## 2023-12-09 NOTE — Telephone Encounter (Signed)
 Labs mailed to patient.

## 2023-12-09 NOTE — Telephone Encounter (Signed)
 Copied from CRM [id number]. Topic: Clinical - Lab/Test Results >> Dec 09, 2023  9:06 AM Ann Perkins wrote: Reason for CRM: Patient is requesting a copy of lab results from 12/01/23 mailed to her home. Patient stated Ann Perkins said she would mail it but she has not received it yet.

## 2023-12-16 ENCOUNTER — Ambulatory Visit (INDEPENDENT_AMBULATORY_CARE_PROVIDER_SITE_OTHER): Admitting: Nurse Practitioner

## 2023-12-16 VITALS — BP 130/78 | HR 56 | Temp 98.3°F | Ht <= 58 in | Wt 127.8 lb

## 2023-12-16 DIAGNOSIS — S90561A Insect bite (nonvenomous), right ankle, initial encounter: Secondary | ICD-10-CM

## 2023-12-16 DIAGNOSIS — W57XXXA Bitten or stung by nonvenomous insect and other nonvenomous arthropods, initial encounter: Secondary | ICD-10-CM | POA: Diagnosis not present

## 2023-12-16 MED ORDER — DOXYCYCLINE HYCLATE 100 MG PO TABS: 100.0000 mg | ORAL_TABLET | Freq: Two times a day (BID) | ORAL | 0 refills | Status: AC

## 2023-12-16 NOTE — Progress Notes (Signed)
 Bluford Burkitt, NP-C Phone: [phone]  Ann Perkins is a 78 y.o. female who presents today for tick bite.   Discussed the use of AI scribe software for clinical note transcription with the patient, who gave verbal consent to proceed.  History of Present Illness   Ann Perkins is a 78 year old female who presents with a tick bite on her right ankle.  On Dec 01, 2023, she discovered a tick bite on her shoulder, then another tick bite 5 days later on her right ankle, initially experiencing itching at the site. She applied Benadryl cream and later removed the tick, noting it 'popped and blood came out.' She is uncertain if the entire tick was removed. Since then, she has been applying Benadryl cream and recently started using hydrocortisone  cream. The site is sometimes itchy but not particularly tender. No systemic symptoms such as body aches, headaches, fevers, or chills are present. Her joint pains are her norm.  She inquires about the time frame for developing symptoms from a tick bite and expresses concern about potential side effects of antibiotics, specifically yeast infections, as she has not taken doxycycline  before.      Social History   Tobacco Use  Smoking Status Never  Smokeless Tobacco Never    Current Outpatient Medications on File Prior to Visit  Medication Sig Dispense Refill   amLODipine  (NORVASC ) 2.5 MG tablet Take 1 tablet (2.5 mg total) by mouth daily. 90 tablet 3   fluticasone (FLONASE) 50 MCG/ACT nasal spray Place into both nostrils daily as needed.     levothyroxine  (SYNTHROID ) 50 MCG tablet Take 1 tablet (50 mcg total) by mouth daily. 90 tablet 1   lisinopril  (ZESTRIL ) 10 MG tablet Take 1 tablet (10 mg total) by mouth 2 (two) times daily. 180 tablet 2   sodium chloride  (OCEAN) 0.65 % nasal spray Place 1 spray into the nose as needed for congestion.     SYNTHROID  75 MCG tablet Take 1 tablet (75 mcg total) by mouth daily. 90 tablet 1   No current  facility-administered medications on file prior to visit.     ROS see history of present illness  Objective  Physical Exam Vitals:   12/16/23 1032  BP: 130/78  Pulse: (!) 56  Temp: 98.3 F (36.8 C)  SpO2: 95%    BP Readings from Last 3 Encounters:  12/16/23 130/78  12/01/23 124/72  08/05/23 130/70   Wt Readings from Last 3 Encounters:  12/16/23 127 lb 12.8 oz (58 kg)  12/01/23 127 lb 12.8 oz (58 kg)  08/05/23 126 lb 3.2 oz (57.2 kg)    Physical Exam Constitutional:      General: She is not in acute distress.    Appearance: Normal appearance.  HENT:     Head: Normocephalic.  Cardiovascular:     Rate and Rhythm: Normal rate and regular rhythm.     Heart sounds: Normal heart sounds.  Pulmonary:     Effort: Pulmonary effort is normal.     Breath sounds: Normal breath sounds.  Skin:    General: Skin is warm and dry.     Comments: Bite noted on right ankle, surrounding erythema present. No warmth or tenderness. See pic below.  Neurological:     General: No focal deficit present.     Mental Status: She is alert.  Psychiatric:        Mood and Affect: Mood normal.        Behavior: Behavior normal.  Assessment/Plan: Please see individual problem list.  Tick bite of right ankle, initial encounter Assessment & Plan: A tick bite on her right ankle since Dec 06, 2023, shows no signs of infection. Intermittent itching is managed with topical treatments. She has no systemic symptoms. Prophylactic doxycycline  and the potential risk of candidiasis were discussed. Prescribe doxycycline  100 mg twice daily for 7 days. Continue using topical Benadryl and hydrocortisone  for itching. Monitor for symptoms of tick-borne illnesses such as myalgia, fatigue, fever, and chills. Advised to contact if symptoms of yeast infection develop with antibiotic use.  Orders: -     Doxycycline  Hyclate; Take 1 tablet (100 mg total) by mouth 2 (two) times daily.  Dispense: 14 tablet; Refill:  0     Return if symptoms worsen or fail to improve.   Bluford Burkitt, NP-C [facility]

## 2023-12-20 ENCOUNTER — Encounter: Payer: Self-pay | Admitting: Nurse Practitioner

## 2023-12-20 DIAGNOSIS — S90561A Insect bite (nonvenomous), right ankle, initial encounter: Secondary | ICD-10-CM | POA: Insufficient documentation

## 2023-12-20 NOTE — Assessment & Plan Note (Addendum)
 A tick bite on her right ankle since Dec 06, 2023, shows no signs of infection. Intermittent itching is managed with topical treatments. She has no systemic symptoms. Prophylactic doxycycline  and the potential risk of candidiasis were discussed. Prescribe doxycycline  100 mg twice daily for 7 days. Continue using topical Benadryl and hydrocortisone  for itching. Monitor for symptoms of tick-borne illnesses such as myalgia, fatigue, fever, and chills. Advised to contact if symptoms of yeast infection develop with antibiotic use.

## 2024-04-05 ENCOUNTER — Ambulatory Visit (INDEPENDENT_AMBULATORY_CARE_PROVIDER_SITE_OTHER): Admitting: Internal Medicine

## 2024-04-05 ENCOUNTER — Encounter: Payer: Self-pay | Admitting: Internal Medicine

## 2024-04-05 VITALS — BP 116/70 | HR 89 | Resp 16 | Ht <= 58 in | Wt 125.6 lb

## 2024-04-05 DIAGNOSIS — Z860100 Personal history of colon polyps, unspecified: Secondary | ICD-10-CM

## 2024-04-05 DIAGNOSIS — E039 Hypothyroidism, unspecified: Secondary | ICD-10-CM | POA: Diagnosis not present

## 2024-04-05 DIAGNOSIS — N1831 Chronic kidney disease, stage 3a: Secondary | ICD-10-CM

## 2024-04-05 DIAGNOSIS — E7800 Pure hypercholesterolemia, unspecified: Secondary | ICD-10-CM | POA: Diagnosis not present

## 2024-04-05 DIAGNOSIS — I10 Essential (primary) hypertension: Secondary | ICD-10-CM | POA: Diagnosis not present

## 2024-04-05 LAB — HEPATIC FUNCTION PANEL
ALT: 13 U/L (ref 0–35)
AST: 19 U/L (ref 0–37)
Albumin: 4.3 g/dL (ref 3.5–5.2)
Alkaline Phosphatase: 51 U/L (ref 39–117)
Bilirubin, Direct: 0.1 mg/dL (ref 0.0–0.3)
Total Bilirubin: 0.6 mg/dL (ref 0.2–1.2)
Total Protein: 6.9 g/dL (ref 6.0–8.3)

## 2024-04-05 LAB — LIPID PANEL
Cholesterol: 179 mg/dL (ref 0–200)
HDL: 46.2 mg/dL (ref >39.00)
LDL Cholesterol: 111 mg/dL — ABNORMAL HIGH (ref 0–99)
NonHDL: 133.15
Total CHOL/HDL Ratio: 4
Triglycerides: 113 mg/dL (ref 0.0–149.0)
VLDL: 22.6 mg/dL (ref 0.0–40.0)

## 2024-04-05 LAB — BASIC METABOLIC PANEL WITH GFR
BUN: 17 mg/dL (ref 6–23)
CO2: 28 meq/L (ref 19–32)
Calcium: 9.8 mg/dL (ref 8.4–10.5)
Chloride: 107 meq/L (ref 96–112)
Creatinine, Ser: 1.11 mg/dL (ref 0.40–1.20)
GFR: 48.24 mL/min — ABNORMAL LOW (ref >60.00)
Glucose, Bld: 100 mg/dL — ABNORMAL HIGH (ref 70–99)
Potassium: 4.8 meq/L (ref 3.5–5.1)
Sodium: 140 meq/L (ref 135–145)

## 2024-04-05 LAB — TSH: TSH: 0.99 u[IU]/mL (ref 0.35–5.50)

## 2024-04-05 NOTE — Assessment & Plan Note (Signed)
05/28/22 - Dr Hilarie Fredrickson - colonoscopy: Two 2 to 3 mm polyps in the ascending colon. Two 4 to 5 mm polyps in the ascending colon. Moderate diverticulosis in the sigmoid colon and in the descending colon. Internal hemorrhoids. Recommend f/u colonoscopy in 3 years.

## 2024-04-05 NOTE — Assessment & Plan Note (Signed)
 Currently taking synthroid  50mcg every third day and 75mcg the other days. Check tsh today.

## 2024-04-05 NOTE — Assessment & Plan Note (Signed)
 Avoid antiinflammatories.  Stay hydrated.  Continue lisinopril . Check metabolic panel today.

## 2024-04-05 NOTE — Assessment & Plan Note (Signed)
 Continue lisinopril  and amlodipine .  Reviewed outside blood pressures - 104-127/60-70s.  Hold on making any changes. Continue to follow pressures.  Check metabolic panel today.

## 2024-04-05 NOTE — Progress Notes (Signed)
 Subjective:    Patient ID: Ann Perkins, female    DOB: 03-26-1946, 78 y.o.   MRN: [id number]  Patient here for  Chief Complaint  Patient presents with   Medical Management of Chronic Issues    HPI Here for a scheduled follow up - follow up regarding hypercholesterolemia, hypertension, CKD and hypothyroidism. Continues on lisinopril  and amlodipine . Blood pressure is doing well. Reviewed outside readings - 104-127/60-70s. No light headedness or dizziness. No chest pain or sob. No abdominal pain or bowel change. Currently taking synthroid  50mcg every third day and 75mcg on the other days. Has a raised skin lesion - left upper arm. Seeing dermatology tomorrow to have checked.    Past Medical History:  Diagnosis Date   Allergy    GERD (gastroesophageal reflux disease)    Hemorrhoids    History of colon polyps    Hypertension    Thyroid  disease    Past Surgical History:  Procedure Laterality Date   COLONOSCOPY  2016   MOUTH SURGERY     x 2   TONSILLECTOMY     WRIST SURGERY     Family History  Problem Relation Age of Onset   Hyperlipidemia Mother    Heart disease Mother        Pacemaker, heart attack   Diabetes Mother    Stroke Father    Heart disease Father        Angina   Colon cancer Sister        x 2 Half(Father Side)   Colon cancer Brother        Half(Father Side)   Colon cancer Cousin        Mother Side    Esophageal cancer Neg Hx    Rectal cancer Neg Hx    Stomach cancer Neg Hx    Social History   Socioeconomic History   Marital status: Single    Spouse name: Not on file   Number of children: Not on file   Years of education: Not on file   Highest education level: Not on file  Occupational History   Occupation: Retired  Tobacco Use   Smoking status: Never   Smokeless tobacco: Never  Vaping Use   Vaping status: Never Used  Substance and Sexual Activity   Alcohol use: No    Alcohol/week: 0.0 standard drinks of alcohol   Drug use: No   Sexual  activity: Not Currently  Other Topics Concern   Not on file  Social History Narrative   Lives alone.  Never married.     Social Drivers of Corporate investment banker Strain: Not on file  Food Insecurity: Not on file  Transportation Needs: Not on file  Physical Activity: Not on file  Stress: Not on file  Social Connections: Not on file     Review of Systems  Constitutional:  Negative for appetite change and unexpected weight change.  HENT:  Negative for congestion and sinus pressure.   Respiratory:  Negative for cough, chest tightness and shortness of breath.   Cardiovascular:  Negative for chest pain, palpitations and leg swelling.  Gastrointestinal:  Negative for abdominal pain, diarrhea, nausea and vomiting.  Genitourinary:  Negative for difficulty urinating and dysuria.  Musculoskeletal:  Negative for joint swelling and myalgias.  Skin:  Negative for color change and rash.  Neurological:  Negative for dizziness, light-headedness and headaches.  Psychiatric/Behavioral:  Negative for agitation and dysphoric mood.        Objective:  BP 116/70   Pulse 89   Resp 16   Ht 4' 9 (1.448 m)   Wt 125 lb 9.6 oz (57 kg)   LMP 07/21/1991   SpO2 98%   BMI 27.18 kg/m  Wt Readings from Last 3 Encounters:  04/05/24 125 lb 9.6 oz (57 kg)  12/16/23 127 lb 12.8 oz (58 kg)  12/01/23 127 lb 12.8 oz (58 kg)    Physical Exam Vitals reviewed.  Constitutional:      General: She is not in acute distress.    Appearance: Normal appearance.  HENT:     Head: Normocephalic and atraumatic.     Right Ear: External ear normal.     Left Ear: External ear normal.     Mouth/Throat:     Pharynx: No oropharyngeal exudate or posterior oropharyngeal erythema.  Eyes:     General: No scleral icterus.       Right eye: No discharge.        Left eye: No discharge.     Conjunctiva/sclera: Conjunctivae normal.  Neck:     Thyroid : No thyromegaly.  Cardiovascular:     Rate and Rhythm: Normal  rate and regular rhythm.  Pulmonary:     Effort: No respiratory distress.     Breath sounds: Normal breath sounds. No wheezing.  Abdominal:     General: Bowel sounds are normal.     Palpations: Abdomen is soft.     Tenderness: There is no abdominal tenderness.  Musculoskeletal:        General: No swelling or tenderness.     Cervical back: Neck supple. No tenderness.  Lymphadenopathy:     Cervical: No cervical adenopathy.  Skin:    Findings: No erythema or rash.  Neurological:     Mental Status: She is alert.  Psychiatric:        Mood and Affect: Mood normal.        Behavior: Behavior normal.         Outpatient Encounter Medications as of 04/05/2024  Medication Sig   amLODipine  (NORVASC ) 2.5 MG tablet Take 1 tablet (2.5 mg total) by mouth daily.   doxycycline  (VIBRA -TABS) 100 MG tablet Take 1 tablet (100 mg total) by mouth 2 (two) times daily.   fluticasone (FLONASE) 50 MCG/ACT nasal spray Place into both nostrils daily as needed.   levothyroxine  (SYNTHROID ) 50 MCG tablet Take 1 tablet (50 mcg total) by mouth daily.   lisinopril  (ZESTRIL ) 10 MG tablet Take 1 tablet (10 mg total) by mouth 2 (two) times daily.   sodium chloride  (OCEAN) 0.65 % nasal spray Place 1 spray into the nose as needed for congestion.   SYNTHROID  75 MCG tablet Take 1 tablet (75 mcg total) by mouth daily.   No facility-administered encounter medications on file as of 04/05/2024.     Lab Results  Component Value Date   WBC 6.1 12/01/2023   HGB 13.7 12/01/2023   HCT 40.9 12/01/2023   PLT 208.0 12/01/2023   GLUCOSE 102 (H) 12/01/2023   CHOL 182 12/01/2023   TRIG 105.0 12/01/2023   HDL 45.60 12/01/2023   LDLCALC 115 (H) 12/01/2023   ALT 14 12/01/2023   AST 21 12/01/2023   NA 141 12/01/2023   K 4.4 12/01/2023   CL 106 12/01/2023   CREATININE 1.19 12/01/2023   BUN 14 12/01/2023   CO2 28 12/01/2023   TSH 1.16 12/01/2023    CT Head Wo Contrast Result Date: 09/17/2019 CLINICAL DATA:  Poly trauma  head and  C-spine injury. EXAM: CT HEAD WITHOUT CONTRAST CT CERVICAL SPINE WITHOUT CONTRAST TECHNIQUE: Multidetector CT imaging of the head and cervical spine was performed following the standard protocol without intravenous contrast. Multiplanar CT image reconstructions of the cervical spine were also generated. COMPARISON:  None FINDINGS: CT HEAD FINDINGS Brain: No evidence of acute infarction, hemorrhage, hydrocephalus, extra-axial collection or mass lesion/mass effect. Signs of chronic microvascular ischemic change and atrophy. Vascular: No hyperdense vessel or unexpected calcification. Skull: Normal. Negative for fracture or focal lesion. Sinuses/Orbits: No acute finding. Other: Signs of right frontal hematoma. Small hematoma without underlying fracture. CT CERVICAL SPINE FINDINGS Alignment: Normal. Skull base and vertebrae: No acute fracture. No primary bone lesion or focal pathologic process. Soft tissues and spinal canal: No prevertebral fluid or swelling. No visible canal hematoma. Disc levels: Multilevel degenerative changes in the spine greatest at C5-6 and C6-7 with mild to moderate anterior osteophytes and uncovertebral degenerative changes. Upper chest: Negative. Other: None IMPRESSION: 1. No CT evidence for acute intracranial process. 2. Signs of chronic microvascular ischemic change and atrophy. 3. Signs of right frontal hematoma without underlying fracture. 4. No fracture or malalignment in the cervical spine. Electronically Signed   By: Isla Blind M.D.   On: 09/17/2019 14:36   CT Cervical Spine Wo Contrast Result Date: 09/17/2019 CLINICAL DATA:  Poly trauma head and C-spine injury. EXAM: CT HEAD WITHOUT CONTRAST CT CERVICAL SPINE WITHOUT CONTRAST TECHNIQUE: Multidetector CT imaging of the head and cervical spine was performed following the standard protocol without intravenous contrast. Multiplanar CT image reconstructions of the cervical spine were also generated. COMPARISON:  None FINDINGS:  CT HEAD FINDINGS Brain: No evidence of acute infarction, hemorrhage, hydrocephalus, extra-axial collection or mass lesion/mass effect. Signs of chronic microvascular ischemic change and atrophy. Vascular: No hyperdense vessel or unexpected calcification. Skull: Normal. Negative for fracture or focal lesion. Sinuses/Orbits: No acute finding. Other: Signs of right frontal hematoma. Small hematoma without underlying fracture. CT CERVICAL SPINE FINDINGS Alignment: Normal. Skull base and vertebrae: No acute fracture. No primary bone lesion or focal pathologic process. Soft tissues and spinal canal: No prevertebral fluid or swelling. No visible canal hematoma. Disc levels: Multilevel degenerative changes in the spine greatest at C5-6 and C6-7 with mild to moderate anterior osteophytes and uncovertebral degenerative changes. Upper chest: Negative. Other: None IMPRESSION: 1. No CT evidence for acute intracranial process. 2. Signs of chronic microvascular ischemic change and atrophy. 3. Signs of right frontal hematoma without underlying fracture. 4. No fracture or malalignment in the cervical spine. Electronically Signed   By: Isla Blind M.D.   On: 09/17/2019 14:36   DG Wrist Complete Right Result Date: 09/17/2019 CLINICAL DATA:  Status post fall, right wrist pain EXAM: RIGHT WRIST - COMPLETE 3+ VIEW COMPARISON:  None. FINDINGS: Generalized osteopenia. Comminuted and impacted distal radial metaphysis fracture extending to the articular surface of the radiocarpal joint. Nondisplaced fracture of the ulnar styloid process. Mild osteoarthritis of the first [facility] joint, first MCP joint and first IP joint. Soft tissue swelling around the right wrist. IMPRESSION: 1. Comminuted and impacted distal radial metaphysis fracture extending to the articular surface of the radiocarpal joint. 2. Nondisplaced fracture of the ulnar styloid process. Electronically Signed   By: Julaine Blanch   On: 09/17/2019 14:28   DG Ribs Unilateral  W/Chest Right Result Date: 09/17/2019 CLINICAL DATA:  Right rib pain. EXAM: RIGHT RIBS AND CHEST - 3+ VIEW COMPARISON:  None. FINDINGS: No fracture or other bone lesions are seen involving the ribs.  There is no evidence of pneumothorax or pleural effusion. Both lungs are clear. Heart size and mediastinal contours are within normal limits. IMPRESSION: Negative. Electronically Signed   By: Julaine Blanch   On: 09/17/2019 14:26       Assessment & Plan:  Hypothyroidism, unspecified type Assessment & Plan: Currently taking synthroid  50mcg every third day and 75mcg the other days. Check tsh today.   Orders: -     TSH  Hypercholesterolemia Assessment & Plan: Have discussed calculated cholesterol risk.  Have discussed recommendation to start a cholesterol medication.  She has declined.  Low cholesterol diet and exercise.  Check lipid panel and liver function today.  The 10-year ASCVD risk score (Arnett DK, et al., 2019) is: 19.2%   Values used to calculate the score:     Age: 69 years     Clincally relevant sex: Female     Is Non-Hispanic African American: No     Diabetic: No     Tobacco smoker: No     Systolic Blood Pressure: 116 mmHg     Is BP treated: Yes     HDL Cholesterol: 45.6 mg/dL     Total Cholesterol: 182 mg/dL   Orders: -     Hepatic function panel -     Lipid panel  Essential hypertension, benign Assessment & Plan: Continue lisinopril  and amlodipine .  Reviewed outside blood pressures - 104-127/60-70s.  Hold on making any changes. Continue to follow pressures.  Check metabolic panel today.   Orders: -     Basic metabolic panel with GFR  History of colonic polyps Assessment & Plan: 05/28/22 - Dr Pyrtle - colonoscopy: Two 2 to 3 mm polyps in the ascending colon. Two 4 to 5 mm polyps in the ascending colon. Moderate diverticulosis in the sigmoid colon and in the descending colon. Internal hemorrhoids. Recommend f/u colonoscopy in 3 years.    Stage 3a chronic kidney disease  (HCC) Assessment & Plan: Avoid antiinflammatories.  Stay hydrated.  Continue lisinopril . Check metabolic panel today.       Allena Hamilton, MD

## 2024-04-05 NOTE — Assessment & Plan Note (Signed)
 Have discussed calculated cholesterol risk.  Have discussed recommendation to start a cholesterol medication.  She has declined.  Low cholesterol diet and exercise.  Check lipid panel and liver function today.  The 10-year ASCVD risk score (Arnett DK, et al., 2019) is: 19.2%   Values used to calculate the score:     Age: 78 years     Clincally relevant sex: Female     Is Non-Hispanic African American: No     Diabetic: No     Tobacco smoker: No     Systolic Blood Pressure: 116 mmHg     Is BP treated: Yes     HDL Cholesterol: 45.6 mg/dL     Total Cholesterol: 182 mg/dL

## 2024-04-06 ENCOUNTER — Ambulatory Visit: Payer: Self-pay | Admitting: Internal Medicine

## 2024-04-06 DIAGNOSIS — L821 Other seborrheic keratosis: Secondary | ICD-10-CM | POA: Diagnosis not present

## 2024-04-06 DIAGNOSIS — D485 Neoplasm of uncertain behavior of skin: Secondary | ICD-10-CM | POA: Diagnosis not present

## 2024-04-06 NOTE — Telephone Encounter (Signed)
 Copied from CRM [id number]. Topic: Clinical - Lab/Test Results >> Apr 06, 2024  2:52 PM Franky GRADE wrote: Reason for CRM: Patient is returning a call she received from Daijah, advised patient of the message left by Dr.Scott. Patient understood but still wanted to speak with Daijah, she would also like a copy of the results mailed to her home.

## 2024-05-01 ENCOUNTER — Other Ambulatory Visit: Payer: Self-pay | Admitting: Internal Medicine

## 2024-05-02 ENCOUNTER — Ambulatory Visit (INDEPENDENT_AMBULATORY_CARE_PROVIDER_SITE_OTHER)

## 2024-05-02 DIAGNOSIS — Z23 Encounter for immunization: Secondary | ICD-10-CM | POA: Diagnosis not present

## 2024-05-02 NOTE — Addendum Note (Signed)
 Addended by: Takao Lizer on: 05/02/2024 03:12 PM   Modules accepted: Level of Service

## 2024-05-02 NOTE — Progress Notes (Signed)
 Pt received High Dose Flu injection in Left  deltoid muscle. Pt tolerated it well with no complaints or concerns.

## 2024-05-06 DIAGNOSIS — S9031XA Contusion of right foot, initial encounter: Secondary | ICD-10-CM | POA: Diagnosis not present

## 2024-08-14 ENCOUNTER — Telehealth: Payer: Self-pay

## 2024-08-14 DIAGNOSIS — E039 Hypothyroidism, unspecified: Secondary | ICD-10-CM

## 2024-08-14 DIAGNOSIS — E7800 Pure hypercholesterolemia, unspecified: Secondary | ICD-10-CM

## 2024-08-14 DIAGNOSIS — I10 Essential (primary) hypertension: Secondary | ICD-10-CM

## 2024-08-14 NOTE — Addendum Note (Signed)
 Addended by: GLENDIA ALLENA RAMAN on: 08/14/2024 03:55 PM   Modules accepted: Orders

## 2024-08-14 NOTE — Addendum Note (Signed)
 Addended by: Zikeria Keough on: 08/14/2024 02:37 PM   Modules accepted: Orders

## 2024-08-14 NOTE — Telephone Encounter (Signed)
 Orders signed for labs. If she is concerned can go ahead and schedule fasting labs. Also, we can move her appt up if needed.  Thank you

## 2024-08-14 NOTE — Telephone Encounter (Signed)
 Copied from CRM [id number]. Topic: Clinical - Request for Lab/Test Order >> Aug 14, 2024 10:14 AM Robinson DEL wrote: Reason for CRM: Asking is she can have bloodwork done prior to physical scheduled on 3/31 on 1:00 pm, concerned about thyroid  on the low end last time she checked it was beginning of range and afraid its gone under range, can't tell if she's having any issues. In the pass medications have had to be adjusted

## 2024-08-16 ENCOUNTER — Encounter: Admitting: Internal Medicine

## 2024-08-23 ENCOUNTER — Other Ambulatory Visit

## 2024-08-23 DIAGNOSIS — I10 Essential (primary) hypertension: Secondary | ICD-10-CM

## 2024-08-23 DIAGNOSIS — E7800 Pure hypercholesterolemia, unspecified: Secondary | ICD-10-CM

## 2024-08-23 DIAGNOSIS — E039 Hypothyroidism, unspecified: Secondary | ICD-10-CM

## 2024-08-23 LAB — CBC WITH DIFFERENTIAL/PLATELET
Basophils Absolute: 0 10*3/uL (ref 0.0–0.1)
Basophils Relative: 0.3 % (ref 0.0–3.0)
Eosinophils Absolute: 0.1 10*3/uL (ref 0.0–0.7)
Eosinophils Relative: 1.3 % (ref 0.0–5.0)
HCT: 40.4 % (ref 36.0–46.0)
Hemoglobin: 13.5 g/dL (ref 12.0–15.0)
Lymphocytes Relative: 26.8 % (ref 12.0–46.0)
Lymphs Abs: 1.5 10*3/uL (ref 0.7–4.0)
MCHC: 33.4 g/dL (ref 30.0–36.0)
MCV: 90.1 fl (ref 78.0–100.0)
Monocytes Absolute: 0.4 10*3/uL (ref 0.1–1.0)
Monocytes Relative: 7.7 % (ref 3.0–12.0)
Neutro Abs: 3.6 10*3/uL (ref 1.4–7.7)
Neutrophils Relative %: 63.9 % (ref 43.0–77.0)
Platelets: 212 10*3/uL (ref 150.0–400.0)
RBC: 4.48 Mil/uL (ref 3.87–5.11)
RDW: 13.2 % (ref 11.5–15.5)
WBC: 5.6 10*3/uL (ref 4.0–10.5)

## 2024-08-23 LAB — HEPATIC FUNCTION PANEL
ALT: 12 U/L (ref 3–35)
AST: 18 U/L (ref 5–37)
Albumin: 4.2 g/dL (ref 3.5–5.2)
Alkaline Phosphatase: 72 U/L (ref 39–117)
Bilirubin, Direct: 0.1 mg/dL (ref 0.1–0.3)
Total Bilirubin: 0.4 mg/dL (ref 0.2–1.2)
Total Protein: 6.9 g/dL (ref 6.0–8.3)

## 2024-08-23 LAB — LIPID PANEL
Cholesterol: 180 mg/dL (ref 28–200)
HDL: 52 mg/dL
LDL Cholesterol: 114 mg/dL — ABNORMAL HIGH (ref 10–99)
NonHDL: 128.4
Total CHOL/HDL Ratio: 3
Triglycerides: 73 mg/dL (ref 10.0–149.0)
VLDL: 14.6 mg/dL (ref 0.0–40.0)

## 2024-08-23 LAB — BASIC METABOLIC PANEL WITH GFR
BUN: 17 mg/dL (ref 6–23)
CO2: 27 meq/L (ref 19–32)
Calcium: 9.6 mg/dL (ref 8.4–10.5)
Chloride: 102 meq/L (ref 96–112)
Creatinine, Ser: 1.26 mg/dL — ABNORMAL HIGH (ref 0.40–1.20)
GFR: 41.32 mL/min — ABNORMAL LOW
Glucose, Bld: 111 mg/dL — ABNORMAL HIGH (ref 70–99)
Potassium: 4.4 meq/L (ref 3.5–5.1)
Sodium: 138 meq/L (ref 135–145)

## 2024-08-23 LAB — TSH: TSH: 3.86 u[IU]/mL (ref 0.35–5.50)

## 2024-08-24 ENCOUNTER — Ambulatory Visit: Payer: Self-pay | Admitting: Internal Medicine

## 2024-08-29 ENCOUNTER — Ambulatory Visit: Admitting: Internal Medicine

## 2024-10-17 ENCOUNTER — Encounter: Admitting: Internal Medicine

## 2024-12-07 ENCOUNTER — Ambulatory Visit: Admitting: Cardiology
